# Patient Record
Sex: Male | Born: 1980
Health system: Southern US, Community
[De-identification: ages and names within clinical notes are randomized; demographics above are authoritative.]

## PROBLEM LIST (undated history)

## (undated) DIAGNOSIS — F419 Anxiety disorder, unspecified: Secondary | ICD-10-CM

## (undated) DIAGNOSIS — K219 Gastro-esophageal reflux disease without esophagitis: Secondary | ICD-10-CM

## (undated) DIAGNOSIS — E119 Type 2 diabetes mellitus without complications: Secondary | ICD-10-CM

## (undated) DIAGNOSIS — F329 Major depressive disorder, single episode, unspecified: Secondary | ICD-10-CM

## (undated) DIAGNOSIS — M1990 Unspecified osteoarthritis, unspecified site: Secondary | ICD-10-CM

## (undated) DIAGNOSIS — G43909 Migraine, unspecified, not intractable, without status migrainosus: Secondary | ICD-10-CM

## (undated) DIAGNOSIS — F1120 Opioid dependence, uncomplicated: Secondary | ICD-10-CM

## (undated) DIAGNOSIS — F32A Depression, unspecified: Secondary | ICD-10-CM

## (undated) DIAGNOSIS — I10 Essential (primary) hypertension: Secondary | ICD-10-CM

## (undated) DIAGNOSIS — J189 Pneumonia, unspecified organism: Secondary | ICD-10-CM

## (undated) HISTORY — PX: KNEE ARTHROSCOPY: SHX127

## (undated) HISTORY — PX: LAPAROSCOPIC GASTRIC SLEEVE RESECTION: SHX5895

---

## 2012-11-07 ENCOUNTER — Encounter (HOSPITAL_BASED_OUTPATIENT_CLINIC_OR_DEPARTMENT_OTHER): Payer: Self-pay | Admitting: *Deleted

## 2012-11-07 ENCOUNTER — Emergency Department (HOSPITAL_BASED_OUTPATIENT_CLINIC_OR_DEPARTMENT_OTHER)
Admission: EM | Admit: 2012-11-07 | Discharge: 2012-11-08 | Disposition: A | Discharge status: Home / Self Care | Payer: Self-pay | Attending: Emergency Medicine | Admitting: Emergency Medicine

## 2012-11-07 ENCOUNTER — Emergency Department (HOSPITAL_BASED_OUTPATIENT_CLINIC_OR_DEPARTMENT_OTHER): Payer: Self-pay

## 2012-11-07 DIAGNOSIS — M545 Low back pain, unspecified: Secondary | ICD-10-CM | POA: Insufficient documentation

## 2012-11-07 DIAGNOSIS — R52 Pain, unspecified: Secondary | ICD-10-CM

## 2012-11-07 DIAGNOSIS — F17200 Nicotine dependence, unspecified, uncomplicated: Secondary | ICD-10-CM | POA: Insufficient documentation

## 2012-11-07 DIAGNOSIS — I10 Essential (primary) hypertension: Secondary | ICD-10-CM

## 2012-11-07 DIAGNOSIS — E119 Type 2 diabetes mellitus without complications: Secondary | ICD-10-CM | POA: Insufficient documentation

## 2012-11-07 HISTORY — DX: Essential (primary) hypertension: I10

## 2012-11-07 LAB — URINALYSIS, ROUTINE W REFLEX MICROSCOPIC
Bilirubin Urine: NEGATIVE
Leukocytes, UA: NEGATIVE
Nitrite: NEGATIVE
Specific Gravity, Urine: 1.026 (ref 1.005–1.030)
Urobilinogen, UA: 1 mg/dL (ref 0.0–1.0)
pH: 6.5 (ref 5.0–8.0)

## 2012-11-07 MED ORDER — METHOCARBAMOL 500 MG PO TABS: 1000.0000 mg | ORAL_TABLET | Freq: Two times a day (BID) | ORAL | Status: DC

## 2012-11-07 MED ORDER — KETOROLAC TROMETHAMINE 60 MG/2ML IM SOLN
60.0000 mg | Freq: Once | INTRAMUSCULAR | Status: AC
  Administered 2012-11-07: 60 mg via INTRAMUSCULAR
  Filled 2012-11-07: qty 2

## 2012-11-07 MED ORDER — OXYCODONE-ACETAMINOPHEN 5-325 MG PO TABS
2.0000 | ORAL_TABLET | Freq: Once | ORAL | Status: AC
  Administered 2012-11-07: 2 via ORAL
  Filled 2012-11-07 (×2): qty 2

## 2012-11-07 MED ORDER — OXYCODONE-ACETAMINOPHEN 10-325 MG PO TABS: 1.0000 | ORAL_TABLET | ORAL | Status: DC | PRN

## 2012-11-07 MED ORDER — MELOXICAM 15 MG PO TABS: 15.0000 mg | ORAL_TABLET | Freq: Every day | ORAL | Status: DC

## 2012-11-07 NOTE — ED Provider Notes (Signed)
CSN: [id number]     Arrival date & time 11/07/12  1916 History     First MD Initiated Contact with Patient 11/07/12 2011     Chief Complaint  Patient presents with  . Back Pain   (Consider location/radiation/quality/duration/timing/severity/associated sxs/prior Treatment) HPI  Morbidly obese male presents tot the ED with c/o acute LBP. He states that he was hel[png to move furniture four days ago and developed slowly  And progressively since 5 days ago. Hx of the same however this is the worst. No radiation or weakness. Denies weakness, loss of bowel/bladder function or saddle anesthesia. Denies neck stiffness, headache, rash.  Denies fever or recent procedures to back. Denies urinary sxs. Denies fevers, chills, myalgias, arthralgias. Denies DOE, SOB, chest tightness or pressure, radiation to left arm, jaw or back, or diaphoresis. Denies headaches, light headedness, weakness, visual disturbances. Denies abdominal pain, nausea, vomiting, diarrhea or constipation.     Past Medical History  Diagnosis Date  . Hypertension   . Diabetes mellitus without complication    Past Surgical History  Procedure Laterality Date  . Knee surgery     No family history on file. History  Substance Use Topics  . Smoking status: Current Every Day Smoker  . Smokeless tobacco: Not on file  . Alcohol Use: No    Review of Systems Ten systems reviewed and are negative for acute change, except as noted in the HPI.   Allergies  Review of patient's allergies indicates no known allergies.  Home Medications  No current outpatient prescriptions on file. BP 160/101  Pulse 79  Temp(Src) 98.4 F (36.9 C) (Oral)  Resp 23  Ht 6\' 3"  (1.905 m)  Wt 470 lb 3 oz (213.276 kg)  BMI 58.77 kg/m2  SpO2 99% Physical Exam  Physical Exam  Nursing note and vitals reviewed. Constitutional: He appears well-developed and well-nourished. No distress. mobidly obese.r  HENT:  Head: Normocephalic and atraumatic.   Eyes: Conjunctivae normal are normal. No scleral icterus.  Neck: Normal range of motion. Neck supple.  Cardiovascular: Normal rate, regular rhythm and normal heart sounds.   Pulmonary/Chest: Effort normal and breath sounds normal. No respiratory distress.  Abdominal: Soft. There is no tenderness.  Musculoskeletal: He exhibits no edema. TTP lumbar paraspinals. ROM limited due to pain. Straight leg negative. Antalgic gait.   No CVA tenderness. Neurological: He is alert.  Skin: Skin is warm and dry. He is not diaphoretic.  Psychiatric: His behavior is normal.    ED Course   Procedures (including critical care time)  Labs Reviewed  URINALYSIS, ROUTINE W REFLEX MICROSCOPIC - Abnormal; Notable for the following:    Glucose, UA 100 (*)    All other components within normal limits   Dg Lumbar Spine Complete  11/07/2012   *RADIOLOGY REPORT*  Clinical Data: Low back pain.  LUMBAR SPINE - COMPLETE 4+ VIEW  Comparison: None.  Findings: Mild rightward scoliosis in the upper lumbar spine. Degenerative changes at L1-2.  No fracture or subluxation.  SI joints are symmetric and unremarkable.  IMPRESSION: No acute bony abnormality.   Original Report Authenticated By: Charlett Nose, M.D.   No diagnosis found.  MDM    Patient with back pain.  No neurological deficits and normal neuro exam.  Patient can walk but states is painful.  No loss of bowel or bladder control.  No concern for cauda equina.  No fever, night sweats, weight loss, h/o cancer, IVDU.  RICE protocol and pain medicine indicated and discussed with patient.  Arthor Captain, PA-C 11/13/12 1132

## 2012-11-07 NOTE — ED Notes (Signed)
C/o lower back pain that started this past Thursday. Denies any injury. States pain has gotten progressively worse. States pain is constant and sharp. Denies any radiation of pain. Denies any blood in his urine or any urinary frequency. Denies any hx of kidney stones. Denies any n/v. Sitting and walking make pain worse.

## 2012-11-15 NOTE — ED Provider Notes (Signed)
Medical screening examination/treatment/procedure(s) were performed by non-physician practitioner and as supervising physician I was immediately available for consultation/collaboration.   Christobal Morado B. Bernette Mayers, MD 11/15/12 [phone]

## 2012-11-23 ENCOUNTER — Ambulatory Visit: Payer: Self-pay | Attending: Internal Medicine | Admitting: Internal Medicine

## 2012-11-23 VITALS — BP 152/114 | HR 75 | Temp 98.4°F | Resp 18 | Ht 72.0 in | Wt >= 6400 oz

## 2012-11-23 DIAGNOSIS — M545 Low back pain, unspecified: Secondary | ICD-10-CM | POA: Insufficient documentation

## 2012-11-23 DIAGNOSIS — B49 Unspecified mycosis: Secondary | ICD-10-CM

## 2012-11-23 DIAGNOSIS — E119 Type 2 diabetes mellitus without complications: Secondary | ICD-10-CM | POA: Insufficient documentation

## 2012-11-23 DIAGNOSIS — E1165 Type 2 diabetes mellitus with hyperglycemia: Secondary | ICD-10-CM | POA: Insufficient documentation

## 2012-11-23 DIAGNOSIS — B354 Tinea corporis: Secondary | ICD-10-CM | POA: Insufficient documentation

## 2012-11-23 DIAGNOSIS — B369 Superficial mycosis, unspecified: Secondary | ICD-10-CM

## 2012-11-23 DIAGNOSIS — I10 Essential (primary) hypertension: Secondary | ICD-10-CM | POA: Insufficient documentation

## 2012-11-23 DIAGNOSIS — E876 Hypokalemia: Secondary | ICD-10-CM | POA: Insufficient documentation

## 2012-11-23 LAB — GLUCOSE, POCT (MANUAL RESULT ENTRY): POC Glucose: 100 mg/dl — AB (ref 70–99)

## 2012-11-23 LAB — POCT GLYCOSYLATED HEMOGLOBIN (HGB A1C): Hemoglobin A1C: 6.9

## 2012-11-23 MED ORDER — FLUCONAZOLE 150 MG PO TABS: 150.0000 mg | ORAL_TABLET | Freq: Once | ORAL | Status: DC

## 2012-11-23 MED ORDER — METHOCARBAMOL 500 MG PO TABS: 1000.0000 mg | ORAL_TABLET | Freq: Two times a day (BID) | ORAL | Status: DC

## 2012-11-23 MED ORDER — TERBINAFINE HCL 1 % EX CREA: TOPICAL_CREAM | Freq: Two times a day (BID) | CUTANEOUS | Status: DC

## 2012-11-23 MED ORDER — METFORMIN HCL 500 MG PO TABS: 500.0000 mg | ORAL_TABLET | Freq: Two times a day (BID) | ORAL | Status: DC

## 2012-11-23 MED ORDER — LISINOPRIL-HYDROCHLOROTHIAZIDE 20-25 MG PO TABS: 1.0000 | ORAL_TABLET | Freq: Every day | ORAL | Status: DC

## 2012-11-23 MED ORDER — TRAMADOL HCL 50 MG PO TABS: 50.0000 mg | ORAL_TABLET | Freq: Three times a day (TID) | ORAL | Status: DC | PRN

## 2012-11-23 NOTE — Progress Notes (Signed)
Pt here to establish care Hx HTN,DIABETES- NOT TAKING MEDICATION MORBID OBESITY C/O LOWER BACK PAIN,NONRADIATING POST INJURY 2 WEEKS AGO XRAYS NEG ER RAN OUT OF PAIN MEDS BUT STATES THEY WERE EFFECTIVE

## 2012-11-23 NOTE — Progress Notes (Signed)
Patient ID: Brian Day, male   DOB: 15-Apr-1980, 32 y.o.   MRN: [id number]  CC: To establish care  HPI: Patient is a 32 years old man morbidly obese hent today to establish medical care. He has history of hypertension and diabetes not on medications because he has not been to Dr. for a long time. He was recently seen in the urgent care for low back pain that started after a house shore, no specific history of trauma or fall. He said he has not been able to do what he used to do normally because of the pain, but is mostly on the sides of the low back area, no tingling or weakness of the legs, no numbness, no fecal or urinary incontinence. No chest pain, no shortness of breath. He smokes cigarettes about 1 pack per day.  No Known Allergies Past Medical History  Diagnosis Date  . Hypertension   . Diabetes mellitus without complication    Current Outpatient Prescriptions on File Prior to Visit  Medication Sig Dispense Refill  . meloxicam (MOBIC) 15 MG tablet Take 1 tablet (15 mg total) by mouth daily.  10 tablet  0  . oxyCODONE-acetaminophen (PERCOCET) 10-325 MG per tablet Take 1 tablet by mouth every 4 (four) hours as needed for pain.  30 tablet  0   No current facility-administered medications on file prior to visit.   Family History  Problem Relation Age of Onset  . Diabetes Mother   . Hypertension Mother   . Hypertension Father    History   Social History  . Marital Status: Single    Spouse Name: N/A    Number of Children: N/A  . Years of Education: N/A   Occupational History  . Not on file.   Social History Main Topics  . Smoking status: Current Every Day Smoker  . Smokeless tobacco: Not on file  . Alcohol Use: No  . Drug Use: Not on file  . Sexual Activity: Not on file   Other Topics Concern  . Not on file   Social History Narrative  . No narrative on file    Review of Systems: Constitutional: Negative for fever, chills, diaphoresis, activity change, appetite  change and fatigue. HENT: Negative for ear pain, nosebleeds, congestion, facial swelling, rhinorrhea, neck pain, neck stiffness and ear discharge.  Eyes: Negative for pain, discharge, redness, itching and visual disturbance. Respiratory: Negative for cough, choking, chest tightness, shortness of breath, wheezing and stridor.  Cardiovascular: Negative for chest pain, palpitations and leg swelling. Gastrointestinal: Negative for abdominal distention. Genitourinary: Negative for dysuria, urgency, frequency, hematuria, flank pain, decreased urine volume, difficulty urinating and dyspareunia.  Musculoskeletal: ++ back pain, no joint swelling, arthralgias and gait problem. Neurological: Negative for dizziness, tremors, seizures, syncope, facial asymmetry, speech difficulty, weakness, light-headedness, numbness and headaches.  Hematological: Negative for adenopathy. Does not bruise/bleed easily. Psychiatric/Behavioral: Negative for hallucinations, behavioral problems, confusion, dysphoric mood, decreased concentration and agitation.    Objective:   Filed Vitals:   11/23/12 1720  BP: 152/114  Pulse: 75  Temp: 98.4 F (36.9 C)  Resp: 18    Physical Exam: Constitutional: Patient appears well-developed and well-nourished. No distress. Morbidly obese HENT: Normocephalic, atraumatic, External right and left ear normal. Oropharynx is clear and moist.  Eyes: Conjunctivae and EOM are normal. PERRLA, no scleral icterus. Neck: Normal ROM. Neck supple. No JVD. No tracheal deviation. No thyromegaly. CVS: RRR, S1/S2 +, no murmurs, no gallops, no carotid bruit.  Pulmonary: Effort and breath sounds normal,  no stridor, rhonchi, wheezes, rales.  Abdominal: Soft. BS +, extremely obese with hanging abdominal wall, rubbing on the thighs with exfoliating fungal dermatitis  Musculoskeletal: Normal range of motion. No edema and no tenderness.  Lymphadenopathy: No lymphadenopathy noted, cervical, inguinal or  axillary Neuro: Alert. Normal reflexes, muscle tone coordination. No cranial nerve deficit. Skin: Skin is warm and dry. No rash noted. Not diaphoretic. No erythema. No pallor. Psychiatric: Normal mood and affect. Behavior, judgment, thought content normal.  No results found for this basename: WBC, HGB, HCT, MCV, PLT   No results found for this basename: CREATININE, BUN, NA, K, CL, CO2    Lab Results  Component Value Date   HGBA1C 6.9% 11/23/2012   Lipid Panel  No results found for this basename: chol, trig, hdl, cholhdl, vldl, ldlcalc       Assessment and plan:   Patient Active Problem List   Diagnosis Date Noted  . Accelerated hypertension 11/23/2012  . Diabetes 11/23/2012  . Morbid obesity 11/23/2012  . Low back pain 11/23/2012  . Fungal infection of skin of abdomen 11/23/2012   Start Lisinopril-hydrochlorothiazide 20-25 mg tablet by mouth daily for hypertension Start metformin 500 mg tablet by mouth twice a day for diabetes Patient has been counseled on nutrition control and exercise Patient extensively counseled on smoking cessation  Tramadol 50 mg tablet by mouth 3 times a day when necessary pain  Fluconazole 150 mg tablet by mouth once for fungal infection of the abdominal wall Terbinafine 1% cream local application to the abdominal wall  Labs today: CBC D. CMP Lipid profile TSH and free T4 Urinalysis Hemoglobin A1c is 6.9%  Brian Day was given clear instructions to go to ER or return to the clinic if symptoms don't improve, worsen or new problems develop.  Brian Day verbalized understanding.  Brian Day was told to call to get lab results if hasn't heard anything in the next week.        Jeanann Lewandowsky, MD [facility] [place], [place] [phone]   11/23/2012, 6:03 PM

## 2012-11-29 ENCOUNTER — Telehealth: Payer: Self-pay | Admitting: Emergency Medicine

## 2012-11-29 LAB — TSH+FREE T4: TSH: 2.39 u[IU]/mL (ref 0.450–4.500)

## 2012-11-29 NOTE — Telephone Encounter (Signed)
Pt given lab results and Diabetes/diet counseling

## 2012-12-15 LAB — SPECIMEN STATUS REPORT

## 2013-03-07 ENCOUNTER — Encounter (HOSPITAL_BASED_OUTPATIENT_CLINIC_OR_DEPARTMENT_OTHER): Payer: Self-pay | Admitting: Emergency Medicine

## 2013-03-07 ENCOUNTER — Emergency Department (HOSPITAL_BASED_OUTPATIENT_CLINIC_OR_DEPARTMENT_OTHER)
Admission: EM | Admit: 2013-03-07 | Discharge: 2013-03-07 | Disposition: A | Discharge status: Home / Self Care | Payer: Self-pay | Attending: Emergency Medicine | Admitting: Emergency Medicine

## 2013-03-07 ENCOUNTER — Emergency Department (HOSPITAL_BASED_OUTPATIENT_CLINIC_OR_DEPARTMENT_OTHER): Payer: Self-pay

## 2013-03-07 DIAGNOSIS — F17200 Nicotine dependence, unspecified, uncomplicated: Secondary | ICD-10-CM | POA: Insufficient documentation

## 2013-03-07 DIAGNOSIS — Z79899 Other long term (current) drug therapy: Secondary | ICD-10-CM | POA: Insufficient documentation

## 2013-03-07 DIAGNOSIS — G8911 Acute pain due to trauma: Secondary | ICD-10-CM | POA: Insufficient documentation

## 2013-03-07 DIAGNOSIS — M25579 Pain in unspecified ankle and joints of unspecified foot: Secondary | ICD-10-CM | POA: Insufficient documentation

## 2013-03-07 DIAGNOSIS — I10 Essential (primary) hypertension: Secondary | ICD-10-CM | POA: Insufficient documentation

## 2013-03-07 DIAGNOSIS — M25571 Pain in right ankle and joints of right foot: Secondary | ICD-10-CM

## 2013-03-07 DIAGNOSIS — E119 Type 2 diabetes mellitus without complications: Secondary | ICD-10-CM | POA: Insufficient documentation

## 2013-03-07 MED ORDER — MELOXICAM 7.5 MG PO TABS: 7.5000 mg | ORAL_TABLET | Freq: Every day | ORAL | Status: DC

## 2013-03-07 MED ORDER — TRAMADOL HCL 50 MG PO TABS: 50.0000 mg | ORAL_TABLET | Freq: Four times a day (QID) | ORAL | Status: DC | PRN

## 2013-03-07 NOTE — ED Notes (Signed)
C/o right ankle pain-injury "years ago"-pain increase in ankle pain x 4 days-pt using crutches

## 2013-03-07 NOTE — ED Provider Notes (Signed)
CSN: [id number]     Arrival date & time 03/07/13  1839 History   First MD Initiated Contact with Patient 03/07/13 2152     Chief Complaint  Patient presents with  . Ankle Injury   (Consider location/radiation/quality/duration/timing/severity/associated sxs/prior Treatment) Patient is a 32 y.o. male presenting with lower extremity injury. The history is provided by the patient.  Ankle Injury This is a recurrent problem. The problem occurs constantly. The problem has been gradually worsening. He has tried nothing for the symptoms.  Brian Day is a 32 y.o. morbidly obese male who presents to the ED with right ankle pain. He states that he injured the right ankle 2 years ago. Has done well since then until 4 days ago when the ankle started hurting and he saw bruising. The pain is in the ankle and radiates to the foot. The pain increases with ambulation and pressure to the area. He denies any new injury to the area.   Past Medical History  Diagnosis Date  . Hypertension   . Diabetes mellitus without complication    Past Surgical History  Procedure Laterality Date  . Knee surgery     Family History  Problem Relation Age of Onset  . Diabetes Mother   . Hypertension Mother   . Hypertension Father    History  Substance Use Topics  . Smoking status: Current Every Day Smoker  . Smokeless tobacco: Not on file  . Alcohol Use: No    Review of Systems Negative except as stated in HPI Allergies  Review of patient's allergies indicates no known allergies.  Home Medications   Current Outpatient Rx  Name  Route  Sig  Dispense  Refill  . fluconazole (DIFLUCAN) 150 MG tablet   Oral   Take 1 tablet (150 mg total) by mouth once.   1 tablet   0   . lisinopril-hydrochlorothiazide (PRINZIDE,ZESTORETIC) 20-25 MG per tablet   Oral   Take 1 tablet by mouth daily.   90 tablet   3   . meloxicam (MOBIC) 15 MG tablet   Oral   Take 1 tablet (15 mg total) by mouth daily.   10 tablet  0   . metFORMIN (GLUCOPHAGE) 500 MG tablet   Oral   Take 1 tablet (500 mg total) by mouth 2 (two) times daily with a meal.   180 tablet   3   . methocarbamol (ROBAXIN) 500 MG tablet   Oral   Take 2 tablets (1,000 mg total) by mouth 2 (two) times daily.   40 tablet   0   . oxyCODONE-acetaminophen (PERCOCET) 10-325 MG per tablet   Oral   Take 1 tablet by mouth every 4 (four) hours as needed for pain.   30 tablet   0   . terbinafine (LAMISIL AT) 1 % cream   Topical   Apply topically 2 (two) times daily.   30 g   0   . traMADol (ULTRAM) 50 MG tablet   Oral   Take 1 tablet (50 mg total) by mouth every 8 (eight) hours as needed for pain.   30 tablet   0    BP 137/113  Pulse 98  Temp(Src) 98.1 F (36.7 C) (Oral)  Resp 22  Ht 6\' 3"  (1.905 m)  Wt 495 lb (224.531 kg)  BMI 61.87 kg/m2  SpO2 98% Physical Exam  Nursing note and vitals reviewed. Constitutional: He is oriented to person, place, and time. No distress.  Morbidly obese  HENT:  Head:  Normocephalic and atraumatic.  Eyes: EOM are normal.  Neck: Neck supple.  Cardiovascular: Normal rate.   Pulmonary/Chest: Effort normal.  Musculoskeletal: Normal range of motion.       Right ankle: He exhibits ecchymosis. He exhibits normal range of motion, no deformity, no laceration and normal pulse. Swelling: minimal. Tenderness. Lateral malleolus tenderness found. Achilles tendon normal.       Feet:  Strong pedal pulse, adequate circulation, good touch sensation.   Neurological: He is alert and oriented to person, place, and time. No cranial nerve deficit.  Skin: Skin is warm and dry.  Psychiatric: He has a normal mood and affect. His behavior is normal.    ED Course: Watson-Jones dressing, patient to use his crutches, ice elevation and pain management.   Procedures (including critical care time) Labs Review Labs Reviewed - No data to display Imaging Review Dg Ankle Complete Right  03/07/2013   CLINICAL DATA:  Right  ankle pain for 4 days, no recent injury, swelling  EXAM: RIGHT ANKLE - COMPLETE 3+ VIEW  COMPARISON:  01/27/2012  FINDINGS: Osseous mineralization normal.  Ankle mortise intact.  No acute fracture, dislocation or bone destruction.  Small plantar and Achilles insertion calcaneal spurs.  Spurring versus non fused ossicle at the dorsal margin of the navicular.  IMPRESSION: No acute osseous abnormalities.   Electronically Signed   By: Ulyses Southward M.D.   On: 03/07/2013 19:45     MDM  32 y.o. male with right ankle pain. Stable for discharge to follow up with ortho. He will call tomorrow for appointment. I have reviewed this patient's vital signs, nurses notes, appropriate imaging and discussed findings with the patient. He voices understanding.      Medication List    STOP taking these medications       oxyCODONE-acetaminophen 10-325 MG per tablet  Commonly known as:  PERCOCET      TAKE these medications       traMADol 50 MG tablet  Commonly known as:  ULTRAM  Take 1 tablet (50 mg total) by mouth every 6 (six) hours as needed.      ASK your doctor about these medications       fluconazole 150 MG tablet  Commonly known as:  DIFLUCAN  Take 1 tablet (150 mg total) by mouth once.     lisinopril-hydrochlorothiazide 20-25 MG per tablet  Commonly known as:  PRINZIDE,ZESTORETIC  Take 1 tablet by mouth daily.     meloxicam 15 MG tablet  Commonly known as:  MOBIC  Take 1 tablet (15 mg total) by mouth daily.  Ask about: Which instructions should I use?     meloxicam 7.5 MG tablet  Commonly known as:  MOBIC  Take 1 tablet (7.5 mg total) by mouth daily.  Ask about: Which instructions should I use?     metFORMIN 500 MG tablet  Commonly known as:  GLUCOPHAGE  Take 1 tablet (500 mg total) by mouth 2 (two) times daily with a meal.     methocarbamol 500 MG tablet  Commonly known as:  ROBAXIN  Take 2 tablets (1,000 mg total) by mouth 2 (two) times daily.     terbinafine 1 % cream  Commonly  known as:  LAMISIL AT  Apply topically 2 (two) times daily.         [facility] Orlene Och, [place] 03/08/13 [phone]

## 2013-03-09 NOTE — ED Provider Notes (Signed)
Medical screening examination/treatment/procedure(s) were performed by non-physician practitioner and as supervising physician I was immediately available for consultation/collaboration.  EKG Interpretation   None        Gianelle Mccaul, MD 03/09/13 0827 

## 2013-10-27 DIAGNOSIS — I10 Essential (primary) hypertension: Secondary | ICD-10-CM | POA: Diagnosis present

## 2013-12-29 DIAGNOSIS — E1142 Type 2 diabetes mellitus with diabetic polyneuropathy: Secondary | ICD-10-CM | POA: Diagnosis present

## 2014-02-15 ENCOUNTER — Emergency Department (HOSPITAL_BASED_OUTPATIENT_CLINIC_OR_DEPARTMENT_OTHER)
Admission: EM | Admit: 2014-02-15 | Discharge: 2014-02-15 | Disposition: A | Discharge status: Home / Self Care | Payer: Medicaid Other | Attending: Emergency Medicine | Admitting: Emergency Medicine

## 2014-02-15 ENCOUNTER — Encounter (HOSPITAL_BASED_OUTPATIENT_CLINIC_OR_DEPARTMENT_OTHER): Payer: Self-pay

## 2014-02-15 ENCOUNTER — Emergency Department (HOSPITAL_BASED_OUTPATIENT_CLINIC_OR_DEPARTMENT_OTHER): Payer: Medicaid Other

## 2014-02-15 DIAGNOSIS — E119 Type 2 diabetes mellitus without complications: Secondary | ICD-10-CM | POA: Diagnosis not present

## 2014-02-15 DIAGNOSIS — Z720 Tobacco use: Secondary | ICD-10-CM | POA: Insufficient documentation

## 2014-02-15 DIAGNOSIS — R519 Headache, unspecified: Secondary | ICD-10-CM

## 2014-02-15 DIAGNOSIS — I10 Essential (primary) hypertension: Secondary | ICD-10-CM | POA: Insufficient documentation

## 2014-02-15 DIAGNOSIS — Z79899 Other long term (current) drug therapy: Secondary | ICD-10-CM | POA: Insufficient documentation

## 2014-02-15 DIAGNOSIS — R51 Headache: Secondary | ICD-10-CM | POA: Diagnosis present

## 2014-02-15 HISTORY — DX: Morbid (severe) obesity due to excess calories: E66.01

## 2014-02-15 LAB — CBG MONITORING, ED: GLUCOSE-CAPILLARY: 128 mg/dL — AB (ref 70–99)

## 2014-02-15 MED ORDER — HYDROMORPHONE HCL 1 MG/ML IJ SOLN
1.0000 mg | Freq: Once | INTRAMUSCULAR | Status: AC
  Administered 2014-02-15: 1 mg via INTRAVENOUS
  Filled 2014-02-15: qty 1

## 2014-02-15 MED ORDER — METOCLOPRAMIDE HCL 5 MG/ML IJ SOLN
10.0000 mg | Freq: Once | INTRAMUSCULAR | Status: AC
  Administered 2014-02-15: 10 mg via INTRAVENOUS
  Filled 2014-02-15: qty 2

## 2014-02-15 NOTE — ED Notes (Signed)
MD at bedside. 

## 2014-02-15 NOTE — ED Provider Notes (Addendum)
Complains of gradual in onset diffuse headache while at rest . Pain was progressively worsening over 2 hours. Patient had come home after unloading wood earlier today. Patient reports he does not normally get headaches Patient is sleepy arousable to verbal stimulus Glasgow Coma Score 15 appears uncomfortable. Cranial nerves II through XII grossly intact. Moves all extremities. Motor strength 5 over 5 overall. Patient was ambulatory on discharge Strongly doubt occult subarachnoid hemorrhage with gradual onset headache onset during rest  Doug SouSam Koua Deeg, MD 02/15/14 2315  Doug SouSam Amylee Lodato, MD 02/15/14 2316

## 2014-02-15 NOTE — ED Notes (Signed)
C/o HA since 230pm today-no OTC meds-hx of HTN 170/106-mother states PCP is "workin gon getting it regulated"

## 2014-02-15 NOTE — ED Provider Notes (Signed)
CSN: [id number]     Arrival date & time 02/15/14  1755 History   First MD Initiated Contact with Patient 02/15/14 1812     Chief Complaint  Patient presents with  . Headache     (Consider location/radiation/quality/duration/timing/severity/associated sxs/prior Treatment) Patient is a 33 y.o. male presenting with headaches. The history is provided by the patient. No language interpreter was used.  Headache Pain location:  Frontal Radiates to:  Does not radiate Severity currently:  7/10 Severity at highest:  7/10 Onset quality:  Gradual Duration:  4 hours Timing:  Constant Progression:  Worsening Chronicity:  New Similar to prior headaches: no   Context: straining   Context: not intercourse   Relieved by:  Nothing Worsened by:  Nothing tried Ineffective treatments:  None tried Associated symptoms: no fever, no sinus pressure, no sore throat and no weakness   Risk factors: no anger and does not have insomnia   Pt complains of a headache that started today.  Pt has a history of high blood pressure.  Pt was seen by primary MD today  Past Medical History  Diagnosis Date  . Hypertension   . Diabetes mellitus without complication   . Morbid obesity    Past Surgical History  Procedure Laterality Date  . Knee surgery     Family History  Problem Relation Age of Onset  . Diabetes Mother   . Hypertension Mother   . Hypertension Father    History  Substance Use Topics  . Smoking status: Current Every Day Smoker  . Smokeless tobacco: Not on file  . Alcohol Use: No    Review of Systems  Constitutional: Negative for fever.  HENT: Negative for sinus pressure and sore throat.   Neurological: Positive for headaches.  All other systems reviewed and are negative.     Allergies  Lisinopril  Home Medications   Prior to Admission medications   Medication Sig Start Date End Date Taking? Authorizing Provider  ALPRAZolam (XANAX PO) Take by mouth.   Yes Historical Provider,  MD  DULoxetine HCl (CYMBALTA PO) Take by mouth.   Yes Historical Provider, MD  GABAPENTIN, ONCE-DAILY, PO Take by mouth.   Yes Historical Provider, MD  hydrochlorothiazide (HYDRODIURIL) 25 MG tablet Take 25 mg by mouth daily.   Yes Historical Provider, MD  Omeprazole (PRILOSEC PO) Take by mouth.   Yes Historical Provider, MD  Oxycodone-Acetaminophen (PERCOCET PO) Take by mouth.   Yes Historical Provider, MD  TRAZODONE HCL PO Take by mouth.   Yes Historical Provider, MD  metFORMIN (GLUCOPHAGE) 500 MG tablet Take 1 tablet (500 mg total) by mouth 2 (two) times daily with a meal. 11/23/12   Quentin Angstlugbemiga E Jegede, MD  methocarbamol (ROBAXIN) 500 MG tablet Take 2 tablets (1,000 mg total) by mouth 2 (two) times daily. 11/23/12   Quentin Angstlugbemiga E Jegede, MD  terbinafine (LAMISIL AT) 1 % cream Apply topically 2 (two) times daily. 11/23/12   Quentin Angstlugbemiga E Jegede, MD  traMADol (ULTRAM) 50 MG tablet Take 1 tablet (50 mg total) by mouth every 6 (six) hours as needed. 03/07/13   Hope Orlene OchM Neese, NP   BP 163/110 mmHg  Pulse 100  Temp(Src) 97.7 F (36.5 C) (Oral)  Resp 20  Ht 6\' 2"  (1.88 m)  Wt 466 lb (211.376 kg)  BMI 59.81 kg/m2  SpO2 97% Physical Exam  Constitutional: He is oriented to person, place, and time. He appears well-developed and well-nourished.  HENT:  Head: Normocephalic and atraumatic.  Right Ear: External ear  normal.  Left Ear: External ear normal.  Nose: Nose normal.  Mouth/Throat: Oropharynx is clear and moist.  Eyes: Conjunctivae and EOM are normal. Pupils are equal, round, and reactive to light.  Neck: Normal range of motion.  Cardiovascular: Normal rate and normal heart sounds.   Pulmonary/Chest: Effort normal and breath sounds normal.  Abdominal: Soft. He exhibits no distension.  Musculoskeletal: Normal range of motion.  Neurological: He is alert and oriented to person, place, and time.  Skin: Skin is warm.  Psychiatric: He has a normal mood and affect.  Nursing note and vitals  reviewed.   ED Course  Procedures (including critical care time) Labs Review Labs Reviewed - No data to display  Imaging Review No results found.   EKG Interpretation None      MDM  Dr. Rennis ChrisJacobowitz in to see and examine patient. Patient given Reglan IV CT scan shows some chronic sinus disease no acute intracranial findings. he was given Dilaudid IV and reports some relief of headache. Patient is advised to continue blood pressure management.    Final diagnoses:  Head ache   AVS Continue home pain medicines    Elson AreasLeslie K Sofia, PA-C 02/15/14 2035  Doug SouSam Jacubowitz, MD 02/15/14 2317

## 2014-02-15 NOTE — Discharge Instructions (Signed)

## 2014-05-23 DIAGNOSIS — J189 Pneumonia, unspecified organism: Secondary | ICD-10-CM

## 2014-05-23 HISTORY — DX: Pneumonia, unspecified organism: J18.9

## 2014-06-06 ENCOUNTER — Emergency Department (HOSPITAL_BASED_OUTPATIENT_CLINIC_OR_DEPARTMENT_OTHER): Payer: Medicaid Other

## 2014-06-06 ENCOUNTER — Encounter (HOSPITAL_BASED_OUTPATIENT_CLINIC_OR_DEPARTMENT_OTHER): Payer: Self-pay | Admitting: *Deleted

## 2014-06-06 ENCOUNTER — Emergency Department (HOSPITAL_BASED_OUTPATIENT_CLINIC_OR_DEPARTMENT_OTHER)
Admission: EM | Admit: 2014-06-06 | Discharge: 2014-06-06 | Disposition: A | Discharge status: Home / Self Care | Payer: Medicaid Other | Source: Home / Self Care | Attending: Emergency Medicine | Admitting: Emergency Medicine

## 2014-06-06 DIAGNOSIS — J189 Pneumonia, unspecified organism: Secondary | ICD-10-CM

## 2014-06-06 MED ORDER — GUAIFENESIN-CODEINE 100-10 MG/5ML PO SOLN: 5.0000 mL | ORAL | Status: DC | PRN

## 2014-06-06 MED ORDER — ONDANSETRON 4 MG PO TBDP
4.0000 mg | ORAL_TABLET | Freq: Once | ORAL | Status: AC
  Administered 2014-06-06: 4 mg via ORAL
  Filled 2014-06-06: qty 1

## 2014-06-06 MED ORDER — ONDANSETRON 4 MG PO TBDP: 4.0000 mg | ORAL_TABLET | Freq: Three times a day (TID) | ORAL | Status: DC | PRN

## 2014-06-06 MED ORDER — LEVOFLOXACIN 500 MG PO TABS
500.0000 mg | ORAL_TABLET | Freq: Once | ORAL | Status: AC
  Administered 2014-06-06: 500 mg via ORAL
  Filled 2014-06-06: qty 1

## 2014-06-06 MED ORDER — IBUPROFEN 800 MG PO TABS: 800.0000 mg | ORAL_TABLET | Freq: Three times a day (TID) | ORAL | Status: DC | PRN

## 2014-06-06 MED ORDER — IBUPROFEN 800 MG PO TABS
800.0000 mg | ORAL_TABLET | Freq: Once | ORAL | Status: AC
  Administered 2014-06-06: 800 mg via ORAL
  Filled 2014-06-06: qty 1

## 2014-06-06 MED ORDER — LEVOFLOXACIN 500 MG PO TABS: 500.0000 mg | ORAL_TABLET | Freq: Every day | ORAL | Status: DC

## 2014-06-06 NOTE — Discharge Instructions (Signed)

## 2014-06-06 NOTE — ED Notes (Signed)
Patient transported to x-ray. ?

## 2014-06-06 NOTE — ED Notes (Signed)
Cold symptoms for a week. He was started on an antibiotic by his MD. No better. Cough with greenish sputum. Fever and no energy.

## 2014-06-06 NOTE — ED Provider Notes (Signed)
TIME SEEN: 11:30 AM  CHIEF COMPLAINT: Body aches, cough, fever, nausea, vomiting, diarrhea  HPI: Pt is a 34 y.o. male with history of hypertension, non-insulin-dependent diabetes who presents to the emergency department with complaints of one week of cough with yellow, green sputum production, fevers that have improved over the past several days, nausea, vomiting, diarrhea, body aches. Denies any sick contacts or recent travel. Denies having a history of an influenza vaccination last year. States he was put on an antibiotic by his primary care physician that started with an "O" but he was unable to take this medication because of diarrhea. States he is having chest pain with coughing. No shortness of breath.  ROS: See HPI Constitutional:  fever  Eyes: no drainage  ENT:  runny nose   Cardiovascular:  Chest pain with coughing Resp: no SOB  GI:  vomiting GU: no dysuria Integumentary: no rash  Allergy: no hives  Musculoskeletal: no leg swelling  Neurological: no slurred speech ROS otherwise negative  PAST MEDICAL HISTORY/PAST SURGICAL HISTORY:  Past Medical History  Diagnosis Date  . Hypertension   . Diabetes mellitus without complication   . Morbid obesity     MEDICATIONS:  Prior to Admission medications   Medication Sig Start Date End Date Taking? Authorizing Provider  ALPRAZolam (XANAX PO) Take by mouth.    Historical Provider, MD  DULoxetine HCl (CYMBALTA PO) Take by mouth.    Historical Provider, MD  GABAPENTIN, ONCE-DAILY, PO Take by mouth.    Historical Provider, MD  hydrochlorothiazide (HYDRODIURIL) 25 MG tablet Take 25 mg by mouth daily.    Historical Provider, MD  metFORMIN (GLUCOPHAGE) 500 MG tablet Take 1 tablet (500 mg total) by mouth 2 (two) times daily with a meal. 11/23/12   Quentin Angstlugbemiga E Jegede, MD  methocarbamol (ROBAXIN) 500 MG tablet Take 2 tablets (1,000 mg total) by mouth 2 (two) times daily. 11/23/12   Quentin Angstlugbemiga E Jegede, MD  Omeprazole (PRILOSEC PO) Take by mouth.     Historical Provider, MD  Oxycodone-Acetaminophen (PERCOCET PO) Take by mouth.    Historical Provider, MD  terbinafine (LAMISIL AT) 1 % cream Apply topically 2 (two) times daily. 11/23/12   Quentin Angstlugbemiga E Jegede, MD  traMADol (ULTRAM) 50 MG tablet Take 1 tablet (50 mg total) by mouth every 6 (six) hours as needed. 03/07/13   Hope Orlene OchM Neese, NP  TRAZODONE HCL PO Take by mouth.    Historical Provider, MD    ALLERGIES:  Allergies  Allergen Reactions  . Lisinopril     SOCIAL HISTORY:  History  Substance Use Topics  . Smoking status: Current Every Day Smoker  . Smokeless tobacco: Not on file  . Alcohol Use: No    FAMILY HISTORY: Family History  Problem Relation Age of Onset  . Diabetes Mother   . Hypertension Mother   . Hypertension Father     EXAM: BP 192/110 mmHg  Pulse 97  Temp(Src) 97.6 F (36.4 C) (Oral)  Resp 22  Ht 6\' 2"  (1.88 m)  Wt 466 lb (211.376 kg)  BMI 59.81 kg/m2  SpO2 95% CONSTITUTIONAL: Alert and oriented and responds appropriately to questions. Well-appearing; well-nourished, appears uncomfortable but is nontoxic, morbidly obese HEAD: Normocephalic EYES: Conjunctivae clear, PERRL ENT: normal nose; dried yellow nasal congestion and bilateral nostrils; moist mucous membranes; pharynx without lesions noted, no tonsillar hypertrophy or exudate, no uvular deviation, no trismus or drooling, normal phonation NECK: Supple, no meningismus, no LAD  CARD: RRR; S1 and S2 appreciated; no murmurs, no  clicks, no rubs, no gallops RESP: Normal chest excursion without splinting or tachypnea; breath sounds clear and equal bilaterally; no wheezes, no rhonchi, no rales, no hypoxia or respiratory distress ABD/GI: Normal bowel sounds; non-distended; soft, non-tender, no rebound, no guarding BACK:  The back appears normal and is non-tender to palpation, there is no CVA tenderness EXT: Normal ROM in all joints; non-tender to palpation; no edema; normal capillary refill; no cyanosis     SKIN: Normal color for age and race; warm, no rash NEURO: Moves all extremities equally PSYCH: The patient's mood and manner are appropriate. Grooming and personal hygiene are appropriate.  MEDICAL DECISION MAKING: Patient here with community-acquired pneumonia seen on chest x-ray. We'll discharge on Levaquin. We'll give him prescription for ibuprofen, Zofran, guaifenesin with codeine. Discussed return precautions. He verbalizes understanding and is comfortable with plan. Otherwise hemodynamically stable, nontoxic, no hypoxia.      Layla Maw Ward, DO 06/06/14 1150

## 2014-06-07 ENCOUNTER — Encounter (HOSPITAL_BASED_OUTPATIENT_CLINIC_OR_DEPARTMENT_OTHER): Payer: Self-pay

## 2014-06-07 ENCOUNTER — Emergency Department (HOSPITAL_BASED_OUTPATIENT_CLINIC_OR_DEPARTMENT_OTHER): Payer: Medicaid Other

## 2014-06-07 ENCOUNTER — Inpatient Hospital Stay (HOSPITAL_BASED_OUTPATIENT_CLINIC_OR_DEPARTMENT_OTHER)
Admission: EM | Admit: 2014-06-07 | Discharge: 2014-06-09 | DRG: 193 | Disposition: A | Discharge status: Home / Self Care | Payer: Medicaid Other | Attending: Internal Medicine | Admitting: Internal Medicine

## 2014-06-07 DIAGNOSIS — A047 Enterocolitis due to Clostridium difficile: Secondary | ICD-10-CM | POA: Diagnosis present

## 2014-06-07 DIAGNOSIS — E876 Hypokalemia: Secondary | ICD-10-CM | POA: Diagnosis present

## 2014-06-07 DIAGNOSIS — E1165 Type 2 diabetes mellitus with hyperglycemia: Secondary | ICD-10-CM

## 2014-06-07 DIAGNOSIS — E119 Type 2 diabetes mellitus without complications: Secondary | ICD-10-CM

## 2014-06-07 DIAGNOSIS — F419 Anxiety disorder, unspecified: Secondary | ICD-10-CM | POA: Diagnosis present

## 2014-06-07 DIAGNOSIS — A0472 Enterocolitis due to Clostridium difficile, not specified as recurrent: Secondary | ICD-10-CM

## 2014-06-07 DIAGNOSIS — Z888 Allergy status to other drugs, medicaments and biological substances status: Secondary | ICD-10-CM | POA: Diagnosis not present

## 2014-06-07 DIAGNOSIS — J9691 Respiratory failure, unspecified with hypoxia: Secondary | ICD-10-CM | POA: Diagnosis present

## 2014-06-07 DIAGNOSIS — E118 Type 2 diabetes mellitus with unspecified complications: Secondary | ICD-10-CM

## 2014-06-07 DIAGNOSIS — R0602 Shortness of breath: Secondary | ICD-10-CM

## 2014-06-07 DIAGNOSIS — F17210 Nicotine dependence, cigarettes, uncomplicated: Secondary | ICD-10-CM | POA: Diagnosis present

## 2014-06-07 DIAGNOSIS — F329 Major depressive disorder, single episode, unspecified: Secondary | ICD-10-CM | POA: Diagnosis present

## 2014-06-07 DIAGNOSIS — Z794 Long term (current) use of insulin: Secondary | ICD-10-CM

## 2014-06-07 DIAGNOSIS — Z6841 Body Mass Index (BMI) 40.0 and over, adult: Secondary | ICD-10-CM

## 2014-06-07 DIAGNOSIS — J189 Pneumonia, unspecified organism: Secondary | ICD-10-CM | POA: Diagnosis not present

## 2014-06-07 DIAGNOSIS — I10 Essential (primary) hypertension: Secondary | ICD-10-CM | POA: Diagnosis present

## 2014-06-07 DIAGNOSIS — J1100 Influenza due to unidentified influenza virus with unspecified type of pneumonia: Secondary | ICD-10-CM | POA: Diagnosis present

## 2014-06-07 DIAGNOSIS — IMO0002 Reserved for concepts with insufficient information to code with codable children: Secondary | ICD-10-CM

## 2014-06-07 HISTORY — DX: Depression, unspecified: F32.A

## 2014-06-07 HISTORY — DX: Major depressive disorder, single episode, unspecified: F32.9

## 2014-06-07 HISTORY — DX: Anxiety disorder, unspecified: F41.9

## 2014-06-07 LAB — BASIC METABOLIC PANEL
Anion gap: 9 (ref 5–15)
BUN: 8 mg/dL (ref 6–23)
CALCIUM: 8.2 mg/dL — AB (ref 8.4–10.5)
CHLORIDE: 98 mmol/L (ref 96–112)
CO2: 31 mmol/L (ref 19–32)
CREATININE: 0.77 mg/dL (ref 0.50–1.35)
GFR calc non Af Amer: >90 mL/min (ref >90)
Glucose, Bld: 196 mg/dL — ABNORMAL HIGH (ref 70–99)
Potassium: 2.9 mmol/L — ABNORMAL LOW (ref 3.5–5.1)
SODIUM: 138 mmol/L (ref 135–145)

## 2014-06-07 LAB — CBC
HEMATOCRIT: 39.4 % (ref 39.0–52.0)
HEMOGLOBIN: 13.9 g/dL (ref 13.0–17.0)
MCH: 32.9 pg (ref 26.0–34.0)
MCHC: 35.3 g/dL (ref 30.0–36.0)
MCV: 93.1 fL (ref 78.0–100.0)
Platelets: 201 10*3/uL (ref 150–400)
RBC: 4.23 MIL/uL (ref 4.22–5.81)
RDW: 12.3 % (ref 11.5–15.5)
WBC: 5.2 10*3/uL (ref 4.0–10.5)

## 2014-06-07 LAB — I-STAT CG4 LACTIC ACID, ED: Lactic Acid, Venous: 1.71 mmol/L (ref 0.5–2.0)

## 2014-06-07 LAB — GLUCOSE, CAPILLARY: Glucose-Capillary: 217 mg/dL — ABNORMAL HIGH (ref 70–99)

## 2014-06-07 MED ORDER — AZITHROMYCIN 500 MG IV SOLR
500.0000 mg | Freq: Once | INTRAVENOUS | Status: AC
  Administered 2014-06-07: 500 mg via INTRAVENOUS
  Filled 2014-06-07: qty 500

## 2014-06-07 MED ORDER — SODIUM CHLORIDE 0.9 % IV BOLUS (SEPSIS)
1000.0000 mL | Freq: Once | INTRAVENOUS | Status: AC
  Administered 2014-06-07: 1000 mL via INTRAVENOUS

## 2014-06-07 MED ORDER — AZITHROMYCIN 500 MG PO TABS
500.0000 mg | ORAL_TABLET | ORAL | Status: DC
  Filled 2014-06-07: qty 1

## 2014-06-07 MED ORDER — CEFTRIAXONE SODIUM IN DEXTROSE 20 MG/ML IV SOLN
1.0000 g | INTRAVENOUS | Status: DC
  Administered 2014-06-07: 1 g via INTRAVENOUS
  Filled 2014-06-07: qty 50

## 2014-06-07 MED ORDER — DEXTROSE 5 % IV SOLN: 1.0000 g | Freq: Once | INTRAVENOUS | Status: DC

## 2014-06-07 MED ORDER — HYDROCHLOROTHIAZIDE 25 MG PO TABS
25.0000 mg | ORAL_TABLET | Freq: Every day | ORAL | Status: DC
  Administered 2014-06-08 – 2014-06-09 (×2): 25 mg via ORAL
  Filled 2014-06-07 (×2): qty 1

## 2014-06-07 MED ORDER — METFORMIN HCL 500 MG PO TABS
500.0000 mg | ORAL_TABLET | Freq: Two times a day (BID) | ORAL | Status: DC
  Filled 2014-06-07: qty 1

## 2014-06-07 MED ORDER — HEPARIN SODIUM (PORCINE) 5000 UNIT/ML IJ SOLN
5000.0000 [IU] | Freq: Three times a day (TID) | INTRAMUSCULAR | Status: DC
  Administered 2014-06-08 (×2): 5000 [IU] via SUBCUTANEOUS
  Filled 2014-06-07 (×7): qty 1

## 2014-06-07 MED ORDER — GUAIFENESIN-CODEINE 100-10 MG/5ML PO SOLN
5.0000 mL | ORAL | Status: DC | PRN
  Administered 2014-06-09: 5 mL via ORAL
  Filled 2014-06-07: qty 5

## 2014-06-07 MED ORDER — TRAMADOL HCL 50 MG PO TABS: 50.0000 mg | ORAL_TABLET | Freq: Four times a day (QID) | ORAL | Status: DC | PRN

## 2014-06-07 MED ORDER — LEVOFLOXACIN IN D5W 750 MG/150ML IV SOLN
750.0000 mg | Freq: Once | INTRAVENOUS | Status: AC
  Administered 2014-06-07: 750 mg via INTRAVENOUS
  Filled 2014-06-07: qty 150

## 2014-06-07 MED ORDER — CETYLPYRIDINIUM CHLORIDE 0.05 % MT LIQD
7.0000 mL | Freq: Two times a day (BID) | OROMUCOSAL | Status: DC
  Administered 2014-06-07 – 2014-06-08 (×2): 7 mL via OROMUCOSAL

## 2014-06-07 NOTE — ED Notes (Signed)
Pt reports one week history of cough, fever, body aches, diagnosed with PNA yesterday - states prescription medicines are not effective.

## 2014-06-07 NOTE — ED Provider Notes (Signed)
CSN: [id number]     Arrival date & time 06/07/14  1837 History  This chart was scribed for Elwin Mocha, MD by Chestine Spore, ED Scribe. The patient was seen in room MH05/MH05 at 7:06 PM.     Chief Complaint  Patient presents with  . Shortness of Breath      Patient is a 34 y.o. male presenting with shortness of breath. The history is provided by the patient. No language interpreter was used.  Shortness of Breath Severity:  Moderate Onset quality:  Sudden Duration:  1 week Timing:  Constant Progression:  Worsening Chronicity:  New Relieved by:  Nothing Worsened by:  Deep breathing Ineffective treatments: Levaquin. Associated symptoms: cough and fever    HPI Comments: Brian Day is a 34 y.o. male with a medical hx of HTN, DM, and morbid obesity who presents to the Emergency Department complaining of SOB onset 1 week. Pt was seen here at [facility] yesterday and was dx with community pneumonia. Pt has not ate any food for the past 6 days. Pt does not feel like he has gotten better since being seen and treatment begun yesterday. He states that he is having associated symptoms of fever, appetite change, fatigue, cough. He denies any other symptoms. Pt has a PCP at [facility].    Past Medical History  Diagnosis Date  . Hypertension   . Diabetes mellitus without complication   . Morbid obesity    Past Surgical History  Procedure Laterality Date  . Knee surgery     Family History  Problem Relation Age of Onset  . Diabetes Mother   . Hypertension Mother   . Hypertension Father    History  Substance Use Topics  . Smoking status: Current Every Day Smoker  . Smokeless tobacco: Not on file  . Alcohol Use: No    Review of Systems  Constitutional: Positive for fever and appetite change.  Respiratory: Positive for cough and shortness of breath.   All other systems reviewed and are negative.     Allergies  Lisinopril  Home Medications   Prior to Admission  medications   Medication Sig Start Date End Date Taking? Authorizing Provider  ALPRAZolam (XANAX PO) Take by mouth.    Historical Provider, MD  DULoxetine HCl (CYMBALTA PO) Take by mouth.    Historical Provider, MD  GABAPENTIN, ONCE-DAILY, PO Take by mouth.    Historical Provider, MD  guaiFENesin-codeine 100-10 MG/5ML syrup Take 5 mLs by mouth every 4 (four) hours as needed for cough. 06/06/14   Kristen N Ward, DO  hydrochlorothiazide (HYDRODIURIL) 25 MG tablet Take 25 mg by mouth daily.    Historical Provider, MD  ibuprofen (ADVIL,MOTRIN) 800 MG tablet Take 1 tablet (800 mg total) by mouth every 8 (eight) hours as needed for mild pain. 06/06/14   Kristen N Ward, DO  levofloxacin (LEVAQUIN) 500 MG tablet Take 1 tablet (500 mg total) by mouth daily. 06/06/14   Kristen N Ward, DO  metFORMIN (GLUCOPHAGE) 500 MG tablet Take 1 tablet (500 mg total) by mouth 2 (two) times daily with a meal. 11/23/12   Quentin Angst, MD  methocarbamol (ROBAXIN) 500 MG tablet Take 2 tablets (1,000 mg total) by mouth 2 (two) times daily. 11/23/12   Quentin Angst, MD  Omeprazole (PRILOSEC PO) Take by mouth.    Historical Provider, MD  ondansetron (ZOFRAN ODT) 4 MG disintegrating tablet Take 1 tablet (4 mg total) by mouth every 8 (eight) hours as needed for nausea  or vomiting. 06/06/14   Layla Maw Ward, DO  Oxycodone-Acetaminophen (PERCOCET PO) Take by mouth.    Historical Provider, MD  terbinafine (LAMISIL AT) 1 % cream Apply topically 2 (two) times daily. 11/23/12   Quentin Angst, MD  traMADol (ULTRAM) 50 MG tablet Take 1 tablet (50 mg total) by mouth every 6 (six) hours as needed. 03/07/13   Hope Orlene Och, NP  TRAZODONE HCL PO Take by mouth.    Historical Provider, MD   BP 157/98 mmHg  Pulse 86  Temp(Src) 98.2 F (36.8 C) (Oral)  Resp 20  Ht  (1.88 m)  Wt 460 lb (208.655 kg)  BMI 59.04 kg/m2  SpO2 95%  Physical Exam  Constitutional: He is oriented to person, place, and time. He appears well-developed  and well-nourished. No distress.  Morbid obesity  HENT:  Head: Normocephalic and atraumatic.  Mouth/Throat: Oropharynx is clear and moist. No oropharyngeal exudate.  Eyes: Pupils are equal, round, and reactive to light.  Neck: Normal range of motion. Neck supple.  Cardiovascular: Normal rate, regular rhythm and normal heart sounds.  Exam reveals no gallop and no friction rub.   No murmur heard. Pulmonary/Chest: Effort normal and breath sounds normal. No respiratory distress. He has no wheezes. He has no rales.  Abdominal: Soft. Bowel sounds are normal. He exhibits no distension and no mass. There is no tenderness. There is no rebound and no guarding.  Musculoskeletal: Normal range of motion. He exhibits no edema or tenderness.  Neurological: He is alert and oriented to person, place, and time.  Skin: Skin is warm and dry.  Psychiatric: He has a normal mood and affect.  Nursing note and vitals reviewed.   ED Course  Procedures (including critical care time) DIAGNOSTIC STUDIES: Oxygen Saturation is 95% on RA, adequate by my interpretation.    COORDINATION OF CARE: 7:10 PM-Discussed treatment plan which includes CXR, IV fluids, admit to hospital with pt at bedside and pt agreed to plan.    Labs Review Labs Reviewed  BASIC METABOLIC PANEL - Abnormal; Notable for the following:    Potassium 2.9 (*)    Glucose, Bld 196 (*)    Calcium 8.2 (*)    All other components within normal limits  CULTURE, BLOOD (ROUTINE X 2)  CULTURE, BLOOD (ROUTINE X 2)  CBC  I-STAT CG4 LACTIC ACID, ED    Imaging Review Dg Chest 2 View  06/06/2014   CLINICAL DATA:  Cough, fever and congestion  EXAM: CHEST  2 VIEW  COMPARISON:  None.  FINDINGS: Cardiac shadow is within normal limits. Patchy perihilar changes are noted along with more focal infiltrate in the left lower lobe medially. No acute bony abnormality is seen. No sizable effusion is noted.  IMPRESSION: Left lower lobe infiltrate with associated  perihilar infiltrates. Followup films following appropriate therapy are recommended.  Although not mentioned in the body of the report there is a curvilinear density overlying the lower neck. This is not well appreciated on the lateral but may be extrinsic to the patient. Clinical correlation is recommended.   Electronically Signed   By: Alcide Clever M.D.   On: 06/06/2014 11:18     EKG Interpretation None      MDM   Final diagnoses:  Shortness of breath  Community acquired pneumonia    34 year old male here for shortness of breath. Present for the past few days. Was seen yesterday, diagnosed with pneumonia. Well-appearing is at home on Levaquin. He has gotten worse with  more malaise, difficulty breathing, cough. Patient hypoxic on arrival at 88%. On exam he is morbidly obese. He is not moving a lot of air in his lungs, but lung sounds are difficult to assess due to his morbid obesity. We'll plan on cultures, antibiotics, admission.  I personally performed the services described in this documentation, which was scribed in my presence. The recorded information has been reviewed and is accurate.     Elwin MochaBlair Cherl Gorney, MD 06/07/14 [id number]

## 2014-06-07 NOTE — H&P (Signed)
[facility] History and Physical  Avien Taha ZOX:[id number] DOB: Aug 29, 1980 DOA: 06/07/2014  Referring physician: EDP PCP: Maye Hides, PA   Chief Complaint: Cough   HPI: Brian Day is a 34 y.o. male who presents to ED with persistent cough.  Cough is non-productive, has been going on over a week now.  Seen in ED yesterday and put on levaquin which provided no relief.  There is associated SOB, body aches, no sore throat, no rhinitis.  Did have fever and chills this past week.  Review of Systems: Systems reviewed.  As above, otherwise negative  Past Medical History  Diagnosis Date  . Hypertension   . Diabetes mellitus without complication   . Morbid obesity   . Depression   . Anxiety    Past Surgical History  Procedure Laterality Date  . Knee surgery     Social History:  reports that he has been smoking Cigarettes.  He has been smoking about 0.50 packs per day. He does not have any smokeless tobacco history on file. He reports that he does not drink alcohol or use illicit drugs.  Allergies  Allergen Reactions  . Lisinopril     Family History  Problem Relation Age of Onset  . Diabetes Mother   . Hypertension Mother   . Hypertension Father      Prior to Admission medications   Medication Sig Start Date End Date Taking? Authorizing Provider  ALPRAZolam (XANAX PO) Take by mouth.    Historical Provider, MD  DULoxetine HCl (CYMBALTA PO) Take by mouth.    Historical Provider, MD  GABAPENTIN, ONCE-DAILY, PO Take by mouth.    Historical Provider, MD  guaiFENesin-codeine 100-10 MG/5ML syrup Take 5 mLs by mouth every 4 (four) hours as needed for cough. 06/06/14   Kristen N Ward, DO  hydrochlorothiazide (HYDRODIURIL) 25 MG tablet Take 25 mg by mouth daily.    Historical Provider, MD  ibuprofen (ADVIL,MOTRIN) 800 MG tablet Take 1 tablet (800 mg total) by mouth every 8 (eight) hours as needed for mild pain. 06/06/14   Kristen N Ward, DO  levofloxacin (LEVAQUIN)  500 MG tablet Take 1 tablet (500 mg total) by mouth daily. 06/06/14   Kristen N Ward, DO  metFORMIN (GLUCOPHAGE) 500 MG tablet Take 1 tablet (500 mg total) by mouth 2 (two) times daily with a meal. 11/23/12   Quentin Angst, MD  methocarbamol (ROBAXIN) 500 MG tablet Take 2 tablets (1,000 mg total) by mouth 2 (two) times daily. 11/23/12   Quentin Angst, MD  Omeprazole (PRILOSEC PO) Take by mouth.    Historical Provider, MD  ondansetron (ZOFRAN ODT) 4 MG disintegrating tablet Take 1 tablet (4 mg total) by mouth every 8 (eight) hours as needed for nausea or vomiting. 06/06/14   Layla Maw Ward, DO  Oxycodone-Acetaminophen (PERCOCET PO) Take by mouth.    Historical Provider, MD  terbinafine (LAMISIL AT) 1 % cream Apply topically 2 (two) times daily. 11/23/12   Quentin Angst, MD  traMADol (ULTRAM) 50 MG tablet Take 1 tablet (50 mg total) by mouth every 6 (six) hours as needed. 03/07/13   Hope Orlene Och, NP  TRAZODONE HCL PO Take by mouth.    Historical Provider, MD   Physical Exam: Filed Vitals:   06/07/14 2320  BP: 169/95  Pulse: 81  Temp: 97.8 F (36.6 C)  Resp: 28    BP 169/95 mmHg  Pulse 81  Temp(Src) 97.8 F (36.6 C) (Oral)  Resp 28  Ht 6'  2" (1.88 m)  Wt 208.655 kg (460 lb)  BMI 59.04 kg/m2  SpO2 92%  General Appearance:    Alert, oriented, no distress, appears stated age  Head:    Normocephalic, atraumatic  Eyes:    PERRL, EOMI, sclera non-icteric        Nose:   Nares without drainage or epistaxis. Mucosa, turbinates normal  Throat:   Moist mucous membranes. Oropharynx without erythema or exudate.  Neck:   Supple. No carotid bruits.  No thyromegaly.  No lymphadenopathy.   Back:     No CVA tenderness, no spinal tenderness  Lungs:     Clear to auscultation bilaterally, without wheezes, rhonchi or rales  Chest wall:    No tenderness to palpitation  Heart:    Regular rate and rhythm without murmurs, gallops, rubs  Abdomen:     Soft, non-tender, nondistended, normal bowel  sounds, no organomegaly  Genitalia:    deferred  Rectal:    deferred  Extremities:   No clubbing, cyanosis or edema.  Pulses:   2+ and symmetric all extremities  Skin:   Skin color, texture, turgor normal, no rashes or lesions  Lymph nodes:   Cervical, supraclavicular, and axillary nodes normal  Neurologic:   CNII-XII intact. Normal strength, sensation and reflexes      throughout    Labs on Admission:  Basic Metabolic Panel:  Recent Labs Lab 06/07/14 1920  NA 138  K 2.9*  CL 98  CO2 31  GLUCOSE 196*  BUN 8  CREATININE 0.77  CALCIUM 8.2*   Liver Function Tests: No results for input(s): AST, ALT, ALKPHOS, BILITOT, PROT, ALBUMIN in the last 168 hours. No results for input(s): LIPASE, AMYLASE in the last 168 hours. No results for input(s): AMMONIA in the last 168 hours. CBC:  Recent Labs Lab 06/07/14 1920  WBC 5.2  HGB 13.9  HCT 39.4  MCV 93.1  PLT 201   Cardiac Enzymes: No results for input(s): CKTOTAL, CKMB, CKMBINDEX, TROPONINI in the last 168 hours.  BNP (last 3 results) No results for input(s): PROBNP in the last 8760 hours. CBG: No results for input(s): GLUCAP in the last 168 hours.  Radiological Exams on Admission: Dg Chest 2 View  06/07/2014   CLINICAL DATA:  Shortness of breath and mid chest pain.  Fever  EXAM: CHEST  2 VIEW  COMPARISON:  06/06/2014  FINDINGS: Central bronchitic change and persistent subtle retrocardiac opacity. No effusion or pneumothorax. Normal heart size and aortic contours.  IMPRESSION: Bronchitis and possible retrocardiac pneumonia, stable from yesterday.   Electronically Signed   By: Marnee SpringJonathon  Watts M.D.   On: 06/07/2014 20:24   Dg Chest 2 View  06/06/2014   CLINICAL DATA:  Cough, fever and congestion  EXAM: CHEST  2 VIEW  COMPARISON:  None.  FINDINGS: Cardiac shadow is within normal limits. Patchy perihilar changes are noted along with more focal infiltrate in the left lower lobe medially. No acute bony abnormality is seen. No  sizable effusion is noted.  IMPRESSION: Left lower lobe infiltrate with associated perihilar infiltrates. Followup films following appropriate therapy are recommended.  Although not mentioned in the body of the report there is a curvilinear density overlying the lower neck. This is not well appreciated on the lateral but may be extrinsic to the patient. Clinical correlation is recommended.   Electronically Signed   By: Alcide CleverMark  Lukens M.D.   On: 06/06/2014 11:18    EKG: Independently reviewed.  Assessment/Plan Principal Problem:   CAP (  community acquired pneumonia) Active Problems:   Accelerated hypertension   Diabetes   1. CAP - patient with retrocardiac infiltrate, failed outpatient treatment, new O2 requirement, desats to 88% with movement. 1. PNA pathway 2. Rocephin and azithromycin 3. Cultures pending 4. Suppress cough 2. DM2 - continue metformin, carb mod diet 3. HTN - continue HCTZ    Code Status: Full Code  Family Communication: No family in room Disposition Plan: Admit to inpatient   Time spent: 70 min  Aloura Matsuoka M. [facility] Pager [phone]  If 7AM-7PM, please contact the day team taking care of the patient [email or web address] Password [facility] [address] 06/07/2014, 11:24 PM

## 2014-06-07 NOTE — ED Notes (Signed)
Pt placed on oxygen via [place] at 3L with improvement in oxygen sat to 96%

## 2014-06-07 NOTE — ED Notes (Signed)
Pt placed on 2 lpm via [place] 

## 2014-06-08 DIAGNOSIS — J1100 Influenza due to unidentified influenza virus with unspecified type of pneumonia: Principal | ICD-10-CM

## 2014-06-08 DIAGNOSIS — A047 Enterocolitis due to Clostridium difficile: Secondary | ICD-10-CM

## 2014-06-08 DIAGNOSIS — A0472 Enterocolitis due to Clostridium difficile, not specified as recurrent: Secondary | ICD-10-CM

## 2014-06-08 DIAGNOSIS — E876 Hypokalemia: Secondary | ICD-10-CM

## 2014-06-08 LAB — HIV ANTIBODY (ROUTINE TESTING W REFLEX): HIV Screen 4th Generation wRfx: NONREACTIVE

## 2014-06-08 LAB — BASIC METABOLIC PANEL
Anion gap: 11 (ref 5–15)
BUN: 6 mg/dL (ref 6–23)
CO2: 28 mmol/L (ref 19–32)
CREATININE: 0.73 mg/dL (ref 0.50–1.35)
Calcium: 8.8 mg/dL (ref 8.4–10.5)
Chloride: 96 mmol/L (ref 96–112)
GFR calc non Af Amer: >90 mL/min (ref >90)
Glucose, Bld: 197 mg/dL — ABNORMAL HIGH (ref 70–99)
Potassium: 2.9 mmol/L — ABNORMAL LOW (ref 3.5–5.1)
Sodium: 135 mmol/L (ref 135–145)

## 2014-06-08 LAB — CLOSTRIDIUM DIFFICILE BY PCR: CDIFFPCR: POSITIVE — AB

## 2014-06-08 LAB — GLUCOSE, CAPILLARY
GLUCOSE-CAPILLARY: 201 mg/dL — AB (ref 70–99)
Glucose-Capillary: 184 mg/dL — ABNORMAL HIGH (ref 70–99)
Glucose-Capillary: 225 mg/dL — ABNORMAL HIGH (ref 70–99)
Glucose-Capillary: 191 mg/dL — ABNORMAL HIGH (ref 70–99)

## 2014-06-08 LAB — INFLUENZA PANEL BY PCR (TYPE A & B)
H1N1FLUPCR: NOT DETECTED
INFLBPCR: NEGATIVE
Influenza A By PCR: POSITIVE — AB

## 2014-06-08 LAB — STREP PNEUMONIAE URINARY ANTIGEN: Strep Pneumo Urinary Antigen: NEGATIVE

## 2014-06-08 MED ORDER — POTASSIUM CHLORIDE CRYS ER 20 MEQ PO TBCR
40.0000 meq | EXTENDED_RELEASE_TABLET | ORAL | Status: AC
  Administered 2014-06-08 (×2): 40 meq via ORAL
  Filled 2014-06-08 (×2): qty 2

## 2014-06-08 MED ORDER — INSULIN ASPART 100 UNIT/ML ~~LOC~~ SOLN
0.0000 [IU] | Freq: Three times a day (TID) | SUBCUTANEOUS | Status: DC
  Administered 2014-06-08: 5 [IU] via SUBCUTANEOUS
  Administered 2014-06-08: 3 [IU] via SUBCUTANEOUS
  Administered 2014-06-08: 5 [IU] via SUBCUTANEOUS
  Administered 2014-06-09 (×2): 3 [IU] via SUBCUTANEOUS

## 2014-06-08 MED ORDER — OXYCODONE-ACETAMINOPHEN 5-325 MG PO TABS: 1.0000 | ORAL_TABLET | Freq: Four times a day (QID) | ORAL | Status: DC

## 2014-06-08 MED ORDER — METRONIDAZOLE 500 MG PO TABS
500.0000 mg | ORAL_TABLET | Freq: Three times a day (TID) | ORAL | Status: DC
  Administered 2014-06-08 – 2014-06-09 (×5): 500 mg via ORAL
  Filled 2014-06-08 (×6): qty 1

## 2014-06-08 MED ORDER — OSELTAMIVIR PHOSPHATE 75 MG PO CAPS
75.0000 mg | ORAL_CAPSULE | Freq: Two times a day (BID) | ORAL | Status: DC
  Administered 2014-06-08 – 2014-06-09 (×3): 75 mg via ORAL
  Filled 2014-06-08 (×4): qty 1

## 2014-06-08 MED ORDER — OXYCODONE-ACETAMINOPHEN 5-325 MG PO TABS
1.0000 | ORAL_TABLET | Freq: Four times a day (QID) | ORAL | Status: DC
  Administered 2014-06-08 – 2014-06-09 (×5): 1 via ORAL
  Filled 2014-06-08 (×5): qty 1

## 2014-06-08 MED ORDER — TRAZODONE HCL 150 MG PO TABS
400.0000 mg | ORAL_TABLET | Freq: Every day | ORAL | Status: DC
  Administered 2014-06-08 (×2): 400 mg via ORAL
  Filled 2014-06-08 (×3): qty 1

## 2014-06-08 MED ORDER — OXYCODONE HCL 5 MG PO TABS
5.0000 mg | ORAL_TABLET | Freq: Four times a day (QID) | ORAL | Status: DC
  Administered 2014-06-08 – 2014-06-09 (×5): 5 mg via ORAL
  Filled 2014-06-08 (×5): qty 1

## 2014-06-08 MED ORDER — OXYCODONE HCL 5 MG PO TABS: 5.0000 mg | ORAL_TABLET | Freq: Four times a day (QID) | ORAL | Status: DC

## 2014-06-08 MED ORDER — NICOTINE 14 MG/24HR TD PT24
14.0000 mg | MEDICATED_PATCH | Freq: Every day | TRANSDERMAL | Status: DC
  Filled 2014-06-08 (×2): qty 1

## 2014-06-08 NOTE — Progress Notes (Signed)
Per Micro, pt C.diff positive, Rizwan MD made aware.

## 2014-06-08 NOTE — Progress Notes (Signed)
Nutrition Brief Note  Patient identified on the Malnutrition Screening Tool (MST) Report.  Wt Readings from Last 15 Encounters:  06/07/14 469 lb 9.6 oz (213.009 kg)  06/06/14 466 lb (211.376 kg)  02/15/14 466 lb (211.376 kg)  03/07/13 495 lb (224.531 kg)  11/23/12 486 lb (220.448 kg)  11/07/12 470 lb 3 oz (213.276 kg)    Body mass index is 60.27 kg/(m^2). Patient meets criteria for class 3, extreme/morbid obesity based on current BMI.   Current diet order is CHO-modified, patient is consuming approximately 100% of meals at this time. Labs and medications reviewed.   No nutrition interventions warranted at this time. If nutrition issues arise, please consult RD.   Joaquin CourtsKimberly Ryonna Cimini, RD, LDN, CNSC Pager [phone] After Hours Pager [phone]

## 2014-06-08 NOTE — Progress Notes (Addendum)
[facility] Progress Note   Brian Day [id number] DOB: 20-Jun-1980 DOA: 06/07/2014 PCP: Brian Day  Brief narrative: Brian Day is a 34 y.o. male male who presents to ED with persistent cough. Cough is non-productive, has been going on over a week now. Seen in ED yesterday and put on levaquin which provided no relief.  Subjective: Having diarrhea this AM. Cough continues.   Assessment/Plan: Principal Problem:   CAP (community acquired pneumonia)- hypoxic resp failure - requiring 3 L of O2  with pulse ox of 93% - influenza positive- stop antibiotics- start Tamiflu -  Active Problems:   c diff colitis - start oral Flagyl and follow for tolerance and resolution of diarrhea  Hypokalemia - replace and recheck    DM - diet controlled at home    Appt with PCP: requested Code Status: full code Family Communication:  Disposition Plan: home when stable DVT prophylaxis: heparin Consultants: Procedures:  Antibiotics: Anti-infectives    Start     Dose/Rate Route Frequency Ordered Stop   06/08/14 2000  azithromycin (ZITHROMAX) tablet 500 mg  Status:  Discontinued     500 mg Oral Every 24 hours 06/07/14 2323 06/08/14 1145   06/08/14 1200  oseltamivir (TAMIFLU) capsule 75 mg     75 mg Oral 2 times daily 06/08/14 1141 06/13/14 0959   06/08/14 1045  metroNIDAZOLE (FLAGYL) tablet 500 mg     500 mg Oral 3 times daily 06/08/14 1038     06/07/14 2325  cefTRIAXone (ROCEPHIN) 1 g in dextrose 5 % 50 mL IVPB - Premix  Status:  Discontinued     1 g 100 mL/hr over 30 Minutes Intravenous Every 24 hours 06/07/14 2323 06/08/14 1145   06/07/14 2000  cefTRIAXone (ROCEPHIN) 1 g in dextrose 5 % 50 mL IVPB  Status:  Discontinued     1 g 100 mL/hr over 30 Minutes Intravenous  Once 06/07/14 1959 06/07/14 2326   06/07/14 2000  azithromycin (ZITHROMAX) 500 mg in dextrose 5 % 250 mL IVPB     500 mg 250 mL/hr over 60 Minutes Intravenous  Once 06/07/14 1959 06/07/14 2233   06/07/14 2000  levofloxacin (LEVAQUIN) IVPB 750 mg     750 mg 100 mL/hr over 90 Minutes Intravenous  Once 06/07/14 2000 06/07/14 2133      Objective: Filed Weights   06/07/14 1851 06/07/14 2327  Weight: 208.655 kg (460 lb) 213.009 kg (469 lb 9.6 oz)    Intake/Output Summary (Last 24 hours) at 06/08/14 1239 Last data filed at 06/08/14 1021  Gross per 24 hour  Intake   1260 ml  Output    600 ml  Net    660 ml     Vitals Filed Vitals:   06/07/14 2134 06/07/14 2320 06/07/14 2327 06/08/14 0551  BP: 138/81 169/95  152/96  Pulse: 81 81  77  Temp: 98 F (36.7 C) 97.8 F (36.6 C)  97.5 F (36.4 C)  TempSrc:  Oral  Oral  Resp: 22 28  15   Height:   6\' 2"  (1.88 m)   Weight:   213.009 kg (469 lb 9.6 oz)   SpO2: 96% 92%  93%    Exam:  General:  Pt is alert, not in acute distress  HEENT: No icterus, No thrush  Cardiovascular: regular rate and rhythm, S1/S2 No murmur  Respiratory: clear to auscultation bilaterally   Abdomen: Soft, +Bowel sounds, non tender, non distended, no guarding  MSK: No LE edema, cyanosis or clubbing  Data  Reviewed: Basic Metabolic Panel:  Recent Labs Lab 06/07/14 1920  NA 138  K 2.9*  CL 98  CO2 31  GLUCOSE 196*  BUN 8  CREATININE 0.77  CALCIUM 8.2*   Liver Function Tests: No results for input(s): AST, ALT, ALKPHOS, BILITOT, PROT, ALBUMIN in the last 168 hours. No results for input(s): LIPASE, AMYLASE in the last 168 hours. No results for input(s): AMMONIA in the last 168 hours. CBC:  Recent Labs Lab 06/07/14 1920  WBC 5.2  HGB 13.9  HCT 39.4  MCV 93.1  PLT 201   Cardiac Enzymes: No results for input(s): CKTOTAL, CKMB, CKMBINDEX, TROPONINI in the last 168 hours. BNP (last 3 results) No results for input(s): BNP in the last 8760 hours.  ProBNP (last 3 results) No results for input(s): PROBNP in the last 8760 hours.  CBG:  Recent Labs Lab 06/07/14 2338 06/08/14 0805 06/08/14 1205  GLUCAP 217* 184* 225*    Recent  Results (from the past 240 hour(s))  Clostridium Difficile by PCR     Status: Abnormal   Collection Time: 06/08/14  3:53 AM  Result Value Ref Range Status   C difficile by pcr POSITIVE (A) NEGATIVE Final    Comment: CRITICAL RESULT CALLED TO, READ BACK BY AND VERIFIED WITH: C.FLORES,RN 06/08/14  BY V.WILKINS      Studies:  Recent x-ray studies have been reviewed in detail by the Attending Physician  Scheduled Meds:  Scheduled Meds: . antiseptic oral rinse  7 mL Mouth Rinse BID  . heparin  5,000 Units Subcutaneous 3 times per day  . hydrochlorothiazide  25 mg Oral Daily  . insulin aspart  0-15 Units Subcutaneous TID WC  . metroNIDAZOLE  500 mg Oral TID  . nicotine  14 mg Transdermal Daily  . oseltamivir  75 mg Oral BID  . traZODone  400 mg Oral QHS   Continuous Infusions:   Time spent on care of this patient: 35 min   Brian Molner, MD 06/08/2014, 12:39 PM  LOS: 1 day   [facility] Office  [phone] Pager - Text Page per [email or web address]  If 7PM-7AM, please contact night-coverage [email or web address]

## 2014-06-08 NOTE — Progress Notes (Signed)
Inpatient Diabetes Program Recommendations  AACE/ADA: New Consensus Statement on Inpatient Glycemic Control (2013)  Target Ranges:  Prepandial:   less than 140 mg/dL      Peak postprandial:   less than 180 mg/dL (1-2 hours)      Critically ill patients:  140 - 180 mg/dL   Inpatient Diabetes Program Recommendations HgbA1C: order to assess prehospital glucose control  Thank you  Kaysee Hergert BSN, RN,CDE Inpatient Diabetes Coordinator [phone] (team pager)  

## 2014-06-08 NOTE — Progress Notes (Signed)
Utilization review completed. Fuquan Wilson, RN, BSN. 

## 2014-06-09 LAB — LEGIONELLA ANTIGEN, URINE

## 2014-06-09 LAB — BASIC METABOLIC PANEL
Anion gap: 12 (ref 5–15)
Anion gap: 10 (ref 5–15)
BUN: 8 mg/dL (ref 6–23)
BUN: 8 mg/dL (ref 6–23)
CALCIUM: 8.8 mg/dL (ref 8.4–10.5)
CALCIUM: 9.1 mg/dL (ref 8.4–10.5)
CO2: 29 mmol/L (ref 19–32)
CO2: 28 mmol/L (ref 19–32)
Chloride: 97 mmol/L (ref 96–112)
Chloride: 98 mmol/L (ref 96–112)
Creatinine, Ser: 0.76 mg/dL (ref 0.50–1.35)
Creatinine, Ser: 0.81 mg/dL (ref 0.50–1.35)
GFR calc Af Amer: >90 mL/min (ref >90)
GFR calc Af Amer: >90 mL/min (ref >90)
GFR calc non Af Amer: >90 mL/min (ref >90)
GLUCOSE: 188 mg/dL — AB (ref 70–99)
Glucose, Bld: 175 mg/dL — ABNORMAL HIGH (ref 70–99)
Potassium: 2.8 mmol/L — ABNORMAL LOW (ref 3.5–5.1)
Potassium: 3.8 mmol/L (ref 3.5–5.1)
SODIUM: 136 mmol/L (ref 135–145)
Sodium: 138 mmol/L (ref 135–145)

## 2014-06-09 LAB — GLUCOSE, CAPILLARY
GLUCOSE-CAPILLARY: 194 mg/dL — AB (ref 70–99)
Glucose-Capillary: 160 mg/dL — ABNORMAL HIGH (ref 70–99)
Glucose-Capillary: 180 mg/dL — ABNORMAL HIGH (ref 70–99)

## 2014-06-09 MED ORDER — MAGNESIUM SULFATE 2 GM/50ML IV SOLN
2.0000 g | Freq: Once | INTRAVENOUS | Status: AC
Start: 2014-06-09 — End: 2014-06-09
  Administered 2014-06-09: 2 g via INTRAVENOUS
  Filled 2014-06-09: qty 50

## 2014-06-09 MED ORDER — IBUPROFEN 400 MG PO TABS: 800.0000 mg | ORAL_TABLET | Freq: Three times a day (TID) | ORAL | Status: DC | PRN

## 2014-06-09 MED ORDER — OSELTAMIVIR PHOSPHATE 75 MG PO CAPS: 75.0000 mg | ORAL_CAPSULE | Freq: Two times a day (BID) | ORAL | Status: DC

## 2014-06-09 MED ORDER — METRONIDAZOLE 500 MG PO TABS: 500.0000 mg | ORAL_TABLET | Freq: Three times a day (TID) | ORAL | Status: DC

## 2014-06-09 MED ORDER — POTASSIUM CHLORIDE CRYS ER 20 MEQ PO TBCR
40.0000 meq | EXTENDED_RELEASE_TABLET | ORAL | Status: AC
  Administered 2014-06-09 (×2): 40 meq via ORAL
  Filled 2014-06-09 (×2): qty 2

## 2014-06-09 NOTE — Progress Notes (Signed)
Brian Day to be D/C'd Home per MD order.  Discussed with the patient and all questions fully answered.  VSS, Skin clean, dry and intact without evidence of skin break down, no evidence of skin tears noted. IV catheter discontinued intact. Site without signs and symptoms of complications. Dressing and pressure applied.  An After Visit Summary was printed and given to the patient. Patient received prescription.  D/c education completed with patient/family including follow up instructions, medication list, d/c activities limitations if indicated, with other d/c instructions as indicated by MD - patient able to verbalize understanding, all questions fully answered.   Patient instructed to return to ED, call 911, or call MD for any changes in condition.   Patient escorted via WC, and D/C home via private auto.  [name], [name] 06/09/2014 4:59 PM

## 2014-06-09 NOTE — Discharge Summary (Signed)
Physician Discharge Summary  Brian BeetsJohn Day [id number] DOB: Aug 15, 1980 DOA: 06/07/2014  PCP: Brian HidesMILLER, Brian DEAN, PA  Admit date: 06/07/2014 Discharge date: 06/09/2014  Time spent: 45 minutes    Discharge Condition: stable Diet recommendation: low sodium, heart healthy  Discharge Diagnoses:  Principal Problem:   Influenza with pneumonia Active Problems:   Hypokalemia   Diabetes type 2 - Diet controlled   CAP (community acquired pneumonia)   C. difficile colitis   History of present illness:  Brian BeetsJohn Day is a 34 y.o. male male who presents to ED with persistent cough. Cough is non-productive, has been going on over a week now. Seen in ED on the Day before admission and put on levaquin which provided no relief.  Hospital Course:  Principal Problem:  CAP (community acquired pneumonia)- hypoxic resp failure - requiring 3 L of O2 with pulse ox of 93% on admission but now on room air - influenza positive- stop antibiotics- started Tamiflu - - now on room air with pulse ox> 95% - no dyspnea or cough  Active Problems:  c diff colitis - started oral Flagyl- last episode of diarrhea yesterday - should complete a 10 Day course  Hypokalemia - replaced    DM - diet controlled at home   Discharge Exam: Filed Weights   06/07/14 1851 06/07/14 2327  Weight: 208.655 kg (460 lb) 213.009 kg (469 lb 9.6 oz)   Filed Vitals:   06/09/14 0621  BP: 157/81  Pulse: 72  Temp: 98.1 F (36.7 C)  Resp: 24    General: AAO x 3, no distress Cardiovascular: RRR, no murmurs  Respiratory: clear to auscultation bilaterally GI: soft, non-tender, non-distended, bowel sound positive  Discharge Instructions You were cared for by a hospitalist during your hospital stay. If you have any questions about your discharge medications or the care you received while you were in the hospital after you are discharged, you can call the unit and asked to speak with the hospitalist on call if the  hospitalist that took care of you is not available. Once you are discharged, your primary care physician will handle any further medical issues. Please note that NO REFILLS for any discharge medications will be authorized once you are discharged, as it is imperative that you return to your primary care physician (or establish a relationship with a primary care physician if you do not have one) for your aftercare needs so that they can reassess your need for medications and monitor your lab values.      Discharge Instructions    Diet - low sodium heart healthy    Complete by:  As directed      Increase activity slowly    Complete by:  As directed             Medication List    STOP taking these medications        levofloxacin 500 MG tablet  Commonly known as:  LEVAQUIN     methocarbamol 500 MG tablet  Commonly known as:  ROBAXIN     ondansetron 4 MG disintegrating tablet  Commonly known as:  ZOFRAN ODT     terbinafine 1 % cream  Commonly known as:  LAMISIL AT      TAKE these medications        guaiFENesin-codeine 100-10 MG/5ML syrup  Take 5 mLs by mouth every 4 (four) hours as needed for cough.     hydrochlorothiazide 25 MG tablet  Commonly known as:  HYDRODIURIL  Take 25  mg by mouth daily.     ibuprofen 400 MG tablet  Commonly known as:  ADVIL,MOTRIN  Take 2 tablets (800 mg total) by mouth every 8 (eight) hours as needed for mild pain.     metroNIDAZOLE 500 MG tablet  Commonly known as:  FLAGYL  Take 1 tablet (500 mg total) by mouth 3 (three) times daily.     oseltamivir 75 MG capsule  Commonly known as:  TAMIFLU  Take 1 capsule (75 mg total) by mouth 2 (two) times daily.     oxyCODONE-acetaminophen 10-325 MG per tablet  Commonly known as:  PERCOCET  Take 1 tablet by mouth 4 (four) times daily.     PRILOSEC PO  Take by mouth.       Allergies  Allergen Reactions  . Lisinopril    Follow-up Information    Follow up with Brian Hides, PA On 06/15/2014.    Specialty:  Physician Assistant   Why:  APPT scheduled for March 24th, 2016 @ 09:45am with Brian Day P.A. @ [phone]. PLEASE REMEMBER TO BRING YOUR DICHARGE PAPERWORK.   Contact information:   [address] [place] [address] [phone]        The results of significant diagnostics from this hospitalization (including imaging, microbiology, ancillary and laboratory) are listed below for reference.    Significant Diagnostic Studies: Dg Chest 2 View  06/07/2014   CLINICAL DATA:  Shortness of breath and mid chest pain.  Fever  EXAM: CHEST  2 VIEW  COMPARISON:  06/06/2014  FINDINGS: Central bronchitic change and persistent subtle retrocardiac opacity. No effusion or pneumothorax. Normal heart size and aortic contours.  IMPRESSION: Bronchitis and possible retrocardiac pneumonia, stable from yesterday.   Electronically Signed   By: Marnee Spring M.D.   On: 06/07/2014 20:24   Dg Chest 2 View  06/06/2014   CLINICAL DATA:  Cough, fever and congestion  EXAM: CHEST  2 VIEW  COMPARISON:  None.  FINDINGS: Cardiac shadow is within normal limits. Patchy perihilar changes are noted along with more focal infiltrate in the left lower lobe medially. No acute bony abnormality is seen. No sizable effusion is noted.  IMPRESSION: Left lower lobe infiltrate with associated perihilar infiltrates. Followup films following appropriate therapy are recommended.  Although not mentioned in the body of the report there is a curvilinear density overlying the lower neck. This is not well appreciated on the lateral but may be extrinsic to the patient. Clinical correlation is recommended.   Electronically Signed   By: Alcide Clever M.D.   On: 06/06/2014 11:18    Microbiology: Recent Results (from the past 240 hour(s))  Blood culture (routine x 2)     Status: None (Preliminary result)   Collection Time: 06/07/14  7:20 PM  Result Value Ref Range Status   Specimen Description BLOOD RIGHT FOREARM  Final   Special  Requests BOTTLES DRAWN AEROBIC AND ANAEROBIC 5CC EACH  Final   Culture   Final           BLOOD CULTURE RECEIVED NO GROWTH TO DATE CULTURE WILL BE HELD FOR 5 DAYS BEFORE ISSUING A FINAL NEGATIVE REPORT Performed at [facility]    Report Status PENDING  Incomplete  Blood culture (routine x 2)     Status: None (Preliminary result)   Collection Time: 06/07/14  7:25 PM  Result Value Ref Range Status   Specimen Description BLOOD RIGHT FOREARM  Final   Special Requests BOTTLES DRAWN AEROBIC AND ANAEROBIC 5CC  EACH  Final   Culture   Final           BLOOD CULTURE RECEIVED NO GROWTH TO DATE CULTURE WILL BE HELD FOR 5 DAYS BEFORE ISSUING A FINAL NEGATIVE REPORT Performed at [facility]    Report Status PENDING  Incomplete  Clostridium Difficile by PCR     Status: Abnormal   Collection Time: 06/08/14  3:53 AM  Result Value Ref Range Status   C difficile by pcr POSITIVE (A) NEGATIVE Final    Comment: CRITICAL RESULT CALLED TO, READ BACK BY AND VERIFIED WITH: C.FLORES,RN 06/08/14  BY V.WILKINS      Labs: Basic Metabolic Panel:  Recent Labs Lab 06/07/14 1920 06/08/14 1146 06/09/14 0606 06/09/14 1324  NA 138 135 138 136  K 2.9* 2.9* 2.8* 3.8  CL 98 96 97 98  CO2 GLUCOSE 196* 197* 175* 188*  BUN CREATININE 0.77 0.73 0.76 0.81  CALCIUM 8.2* 8.8 8.8 9.1   Liver Function Tests: No results for input(s): AST, ALT, ALKPHOS, BILITOT, PROT, ALBUMIN in the last 168 hours. No results for input(s): LIPASE, AMYLASE in the last 168 hours. No results for input(s): AMMONIA in the last 168 hours. CBC:  Recent Labs Lab 06/07/14 1920  WBC 5.2  HGB 13.9  HCT 39.4  MCV 93.1  PLT 201   Cardiac Enzymes: No results for input(s): CKTOTAL, CKMB, CKMBINDEX, TROPONINI in the last 168 hours. BNP: BNP (last 3 results) No results for input(s): BNP in the last 8760 hours.  ProBNP (last 3 results) No results for input(s): PROBNP in the last 8760  hours.  CBG:  Recent Labs Lab 06/08/14 1205 06/08/14 1705 06/08/14 2156 06/09/14 0801 06/09/14 1203  GLUCAP 225* 201* 191* 160* 180*       SignedCalvert Cantor, MD [facility] 06/09/2014, 3:12 PM

## 2014-06-14 LAB — CULTURE, BLOOD (ROUTINE X 2)
Culture: NO GROWTH
Culture: NO GROWTH

## 2014-08-23 ENCOUNTER — Emergency Department (HOSPITAL_BASED_OUTPATIENT_CLINIC_OR_DEPARTMENT_OTHER): Payer: Medicaid Other

## 2014-08-23 ENCOUNTER — Encounter (HOSPITAL_BASED_OUTPATIENT_CLINIC_OR_DEPARTMENT_OTHER): Payer: Self-pay | Admitting: Emergency Medicine

## 2014-08-23 ENCOUNTER — Emergency Department (HOSPITAL_BASED_OUTPATIENT_CLINIC_OR_DEPARTMENT_OTHER)
Admission: EM | Admit: 2014-08-23 | Discharge: 2014-08-23 | Disposition: A | Discharge status: Home / Self Care | Payer: Medicaid Other | Attending: Emergency Medicine | Admitting: Emergency Medicine

## 2014-08-23 DIAGNOSIS — E119 Type 2 diabetes mellitus without complications: Secondary | ICD-10-CM | POA: Insufficient documentation

## 2014-08-23 DIAGNOSIS — J209 Acute bronchitis, unspecified: Secondary | ICD-10-CM | POA: Insufficient documentation

## 2014-08-23 DIAGNOSIS — I10 Essential (primary) hypertension: Secondary | ICD-10-CM | POA: Insufficient documentation

## 2014-08-23 DIAGNOSIS — Z720 Tobacco use: Secondary | ICD-10-CM | POA: Diagnosis not present

## 2014-08-23 DIAGNOSIS — Z79899 Other long term (current) drug therapy: Secondary | ICD-10-CM | POA: Diagnosis not present

## 2014-08-23 DIAGNOSIS — Z792 Long term (current) use of antibiotics: Secondary | ICD-10-CM | POA: Insufficient documentation

## 2014-08-23 DIAGNOSIS — R062 Wheezing: Secondary | ICD-10-CM

## 2014-08-23 DIAGNOSIS — Z8659 Personal history of other mental and behavioral disorders: Secondary | ICD-10-CM | POA: Diagnosis not present

## 2014-08-23 DIAGNOSIS — J40 Bronchitis, not specified as acute or chronic: Secondary | ICD-10-CM

## 2014-08-23 DIAGNOSIS — R05 Cough: Secondary | ICD-10-CM | POA: Diagnosis present

## 2014-08-23 LAB — CBC WITH DIFFERENTIAL/PLATELET
Basophils Absolute: 0 10*3/uL (ref 0.0–0.1)
Basophils Relative: 0 % (ref 0–1)
Eosinophils Absolute: 0.1 10*3/uL (ref 0.0–0.7)
Eosinophils Relative: 1 % (ref 0–5)
HCT: 44.8 % (ref 39.0–52.0)
HEMOGLOBIN: 15.8 g/dL (ref 13.0–17.0)
Lymphocytes Relative: 27 % (ref 12–46)
Lymphs Abs: 3 10*3/uL (ref 0.7–4.0)
MCH: 31.1 pg (ref 26.0–34.0)
MCHC: 35.3 g/dL (ref 30.0–36.0)
MCV: 88.2 fL (ref 78.0–100.0)
Monocytes Absolute: 0.9 10*3/uL (ref 0.1–1.0)
Monocytes Relative: 8 % (ref 3–12)
NEUTROS ABS: 7.1 10*3/uL (ref 1.7–7.7)
NEUTROS PCT: 64 % (ref 43–77)
Platelets: 270 10*3/uL (ref 150–400)
RBC: 5.08 MIL/uL (ref 4.22–5.81)
RDW: 12.2 % (ref 11.5–15.5)
WBC: 11.1 10*3/uL — ABNORMAL HIGH (ref 4.0–10.5)

## 2014-08-23 LAB — COMPREHENSIVE METABOLIC PANEL
ALBUMIN: 3.4 g/dL — AB (ref 3.5–5.0)
ALT: 24 U/L (ref 17–63)
AST: 25 U/L (ref 15–41)
Alkaline Phosphatase: 54 U/L (ref 38–126)
Anion gap: 11 (ref 5–15)
BUN: 11 mg/dL (ref 6–20)
CO2: 30 mmol/L (ref 22–32)
Calcium: 8.8 mg/dL — ABNORMAL LOW (ref 8.9–10.3)
Chloride: 92 mmol/L — ABNORMAL LOW (ref 101–111)
Creatinine, Ser: 0.75 mg/dL (ref 0.61–1.24)
GFR calc non Af Amer: >60 mL/min (ref >60)
Glucose, Bld: 308 mg/dL — ABNORMAL HIGH (ref 65–99)
Potassium: 3.1 mmol/L — ABNORMAL LOW (ref 3.5–5.1)
Sodium: 133 mmol/L — ABNORMAL LOW (ref 135–145)
Total Bilirubin: 0.6 mg/dL (ref 0.3–1.2)
Total Protein: 7.8 g/dL (ref 6.5–8.1)

## 2014-08-23 LAB — LIPASE, BLOOD: LIPASE: 10 U/L — AB (ref 22–51)

## 2014-08-23 MED ORDER — SODIUM CHLORIDE 0.9 % IV BOLUS (SEPSIS)
1000.0000 mL | Freq: Once | INTRAVENOUS | Status: AC
Start: 2014-08-23 — End: 2014-08-23
  Administered 2014-08-23: 1000 mL via INTRAVENOUS

## 2014-08-23 MED ORDER — ALBUTEROL SULFATE (2.5 MG/3ML) 0.083% IN NEBU
5.0000 mg | INHALATION_SOLUTION | Freq: Once | RESPIRATORY_TRACT | Status: AC
  Administered 2014-08-23: 5 mg via RESPIRATORY_TRACT
  Filled 2014-08-23: qty 6

## 2014-08-23 MED ORDER — IPRATROPIUM BROMIDE 0.02 % IN SOLN
0.5000 mg | Freq: Once | RESPIRATORY_TRACT | Status: AC
  Administered 2014-08-23: 0.5 mg via RESPIRATORY_TRACT
  Filled 2014-08-23: qty 2.5

## 2014-08-23 MED ORDER — ONDANSETRON HCL 4 MG/2ML IJ SOLN
4.0000 mg | Freq: Once | INTRAMUSCULAR | Status: AC
  Administered 2014-08-23: 4 mg via INTRAVENOUS
  Filled 2014-08-23: qty 2

## 2014-08-23 MED ORDER — AZITHROMYCIN 250 MG PO TABS
500.0000 mg | ORAL_TABLET | Freq: Once | ORAL | Status: AC
  Administered 2014-08-23: 500 mg via ORAL
  Filled 2014-08-23: qty 2

## 2014-08-23 MED ORDER — AZITHROMYCIN 250 MG PO TABS: 250.0000 mg | ORAL_TABLET | Freq: Every day | ORAL | Status: DC

## 2014-08-23 MED ORDER — ALBUTEROL SULFATE HFA 108 (90 BASE) MCG/ACT IN AERS: 1.0000 | INHALATION_SPRAY | Freq: Four times a day (QID) | RESPIRATORY_TRACT | Status: DC | PRN

## 2014-08-23 NOTE — ED Notes (Signed)
Pt states "same old crap that's been going on since last time I was here", cough and congestions

## 2014-08-23 NOTE — ED Notes (Signed)
MD at bedside. 

## 2014-08-23 NOTE — ED Notes (Signed)
Patient transported to X-ray 

## 2014-08-23 NOTE — ED Provider Notes (Signed)
CSN: [id number]     Arrival date & time 08/23/14  1507 History   First MD Initiated Contact with Patient 08/23/14 1539     Chief Complaint  Patient presents with  . Cough     HPI Patient presents with wheezing and productive cough associated with some diarrhea over the last few days.  Patient is a smoker.  No documented fever.  Some nausea and vomiting.  Denies abdominal pain. Past Medical History  Diagnosis Date  . Hypertension   . Diabetes mellitus without complication   . Morbid obesity   . Depression   . Anxiety    Past Surgical History  Procedure Laterality Date  . Knee surgery     Family History  Problem Relation Age of Onset  . Diabetes Mother   . Hypertension Mother   . Hypertension Father    History  Substance Use Topics  . Smoking status: Current Every Day Smoker -- 0.50 packs/day    Types: Cigarettes  . Smokeless tobacco: Not on file  . Alcohol Use: No    Review of Systems  All other systems reviewed and are negative  Allergies  Lisinopril  Home Medications   Prior to Admission medications   Medication Sig Start Date End Date Taking? Authorizing Provider  albuterol (PROVENTIL HFA;VENTOLIN HFA) 108 (90 BASE) MCG/ACT inhaler Inhale 1-2 puffs into the lungs every 6 (six) hours as needed for wheezing or shortness of breath. 08/23/14   Nelva Nay, MD  azithromycin (ZITHROMAX) 250 MG tablet Take 1 tablet (250 mg total) by mouth daily. 08/23/14   Nelva Nay, MD  guaiFENesin-codeine 100-10 MG/5ML syrup Take 5 mLs by mouth every 4 (four) hours as needed for cough. 06/06/14   Kristen N Ward, DO  hydrochlorothiazide (HYDRODIURIL) 25 MG tablet Take 25 mg by mouth daily.    Historical Provider, MD  ibuprofen (ADVIL,MOTRIN) 400 MG tablet Take 2 tablets (800 mg total) by mouth every 8 (eight) hours as needed for mild pain. 06/09/14   Calvert Cantor, MD  metroNIDAZOLE (FLAGYL) 500 MG tablet Take 1 tablet (500 mg total) by mouth 3 (three) times daily. 06/09/14   Calvert Cantor, MD  Omeprazole (PRILOSEC PO) Take by mouth.    Historical Provider, MD  oseltamivir (TAMIFLU) 75 MG capsule Take 1 capsule (75 mg total) by mouth 2 (two) times daily. 06/09/14   Calvert Cantor, MD  oxyCODONE-acetaminophen (PERCOCET) 10-325 MG per tablet Take 1 tablet by mouth 4 (four) times daily.    Historical Provider, MD   BP 154/119 mmHg  Pulse 96  Temp(Src) 97.5 F (36.4 C) (Oral)  Resp 18  Ht  (1.905 m)  Wt 480 lb (217.727 kg)  BMI 60.00 kg/m2  SpO2 93% Physical Exam  Constitutional: He is oriented to person, place, and time. He appears well-developed and well-nourished. No distress.  HENT:  Head: Normocephalic and atraumatic.  Eyes: Pupils are equal, round, and reactive to light.  Neck: Normal range of motion.  Cardiovascular: Normal rate and intact distal pulses.   Pulmonary/Chest: Effort normal. No respiratory distress. He has wheezes.  Abdominal: Normal appearance. He exhibits no distension. There is no tenderness. There is no rebound and no guarding.  Musculoskeletal: Normal range of motion.  Neurological: He is alert and oriented to person, place, and time. No cranial nerve deficit.  Skin: Skin is warm and dry. No rash noted.  Psychiatric: He has a normal mood and affect. His behavior is normal.  Nursing note and vitals reviewed.  ED Course  Procedures (including critical care time) Medications  azithromycin (ZITHROMAX) tablet 500 mg (not administered)  sodium chloride 0.9 % bolus 1,000 mL (0 mLs Intravenous Stopped 08/23/14 1747)  ondansetron (ZOFRAN) injection 4 mg (4 mg Intravenous Given 08/23/14 1603)  albuterol (PROVENTIL) (2.5 MG/3ML) 0.083% nebulizer solution 5 mg (5 mg Nebulization Given 08/23/14 1746)  ipratropium (ATROVENT) nebulizer solution 0.5 mg (0.5 mg Nebulization Given 08/23/14 1746)    Labs Review Labs Reviewed  CBC WITH DIFFERENTIAL/PLATELET - Abnormal; Notable for the following:    WBC 11.1 (*)    All other components within normal limits   COMPREHENSIVE METABOLIC PANEL - Abnormal; Notable for the following:    Sodium 133 (*)    Potassium 3.1 (*)    Chloride 92 (*)    Glucose, Bld 308 (*)    Calcium 8.8 (*)    Albumin 3.4 (*)    All other components within normal limits  LIPASE, BLOOD - Abnormal; Notable for the following:    Lipase 10 (*)    All other components within normal limits  URINALYSIS, ROUTINE W REFLEX MICROSCOPIC (NOT AT [facility])   Corrected sodium = 136  Imaging Review Dg Chest 2 View  08/23/2014   CLINICAL DATA:  Cough and shortness of breath for 1 week  EXAM: CHEST  2 VIEW  COMPARISON:  June 07, 2014  FINDINGS: There is no edema or consolidation. Heart size and pulmonary vascularity within normal limits. No adenopathy. There is mild degenerative change in the thoracic spine. There is slight anterior wedging of lower thoracic vertebral bodies, stable.  IMPRESSION: No edema or consolidation.   Electronically Signed   By: Bretta BangWilliam  Woodruff III M.D.   On: 08/23/2014 15:35    After treatment in the ED the patient feels back to baseline and wants to go home.  MDM   Final diagnoses:  Bronchitis  Wheezing        Nelva Nayobert Shuntell Foody, MD 08/23/14 [phone]

## 2014-08-23 NOTE — ED Notes (Signed)
Family at bedside. 

## 2014-08-23 NOTE — Discharge Instructions (Signed)
How to Use an Inhaler Using your inhaler correctly is very important. Good technique will make sure that the medicine reaches your lungs.  HOW TO USE AN INHALER:  Take the cap off the inhaler.  If this is the first time using your inhaler, you need to prime it. Shake the inhaler for 5 seconds. Release four puffs into the air, away from your face. Ask your doctor for help if you have questions.  Shake the inhaler for 5 seconds.  Turn the inhaler so the bottle is above the mouthpiece.  Put your pointer finger on top of the bottle. Your thumb holds the bottom of the inhaler.  Open your mouth.  Either hold the inhaler away from your mouth (the width of 2 fingers) or place your lips tightly around the mouthpiece. Ask your doctor which way to use your inhaler.  Breathe out as much air as possible.  Breathe in and push down on the bottle 1 time to release the medicine. You will feel the medicine go in your mouth and throat.  Continue to take a deep breath in very slowly. Try to fill your lungs.  After you have breathed in completely, hold your breath for 10 seconds. This will help the medicine to settle in your lungs. If you cannot hold your breath for 10 seconds, hold it for as long as you can before you breathe out.  Breathe out slowly, through pursed lips. Whistling is an example of pursed lips.  If your doctor has told you to take more than 1 puff, wait at least 15-30 seconds between puffs. This will help you get the best results from your medicine. Do not use the inhaler more than your doctor tells you to.  Put the cap back on the inhaler.  Follow the directions from your doctor or from the inhaler package about cleaning the inhaler. If you use more than one inhaler, ask your doctor which inhalers to use and what order to use them in. Ask your doctor to help you figure out when you will need to refill your inhaler.  If you use a steroid inhaler, always rinse your mouth with water  after your last puff, gargle and spit out the water. Do not swallow the water. GET HELP IF:  The inhaler medicine only partially helps to stop wheezing or shortness of breath.  You are having trouble using your inhaler.  You have some increase in thick spit (phlegm). GET HELP RIGHT AWAY IF:  The inhaler medicine does not help your wheezing or shortness of breath or you have tightness in your chest.  You have dizziness, headaches, or fast heart rate.  You have chills, fever, or night sweats.  You have a large increase of thick spit, or your thick spit is bloody. MAKE SURE YOU:   Understand these instructions.  Will watch your condition.  Will get help right away if you are not doing well or get worse. Document Released: 12/18/2007 Document Revised: 12/29/2012 Document Reviewed: 10/07/2012 [facility] Patient Information 2015 [place], [place]. This information is not intended to replace advice given to you by your health care provider. Make sure you discuss any questions you have with your health care provider.  Upper Respiratory Infection, Adult An upper respiratory infection (URI) is also known as the common cold. It is often caused by a type of germ (virus). Colds are easily spread (contagious). You can pass it to others by kissing, coughing, sneezing, or drinking out of the same glass. Usually,  you get better in 1 or 2 weeks.  HOME CARE   Only take medicine as told by your doctor.  Use a warm mist humidifier or breathe in steam from a hot shower.  Drink enough water and fluids to keep your pee (urine) clear or pale yellow.  Get plenty of rest.  Return to work when your temperature is back to normal or as told by your doctor. You may use a face mask and wash your hands to stop your cold from spreading. GET HELP RIGHT AWAY IF:   After the first few days, you feel you are getting worse.  You have questions about your medicine.  You have chills, shortness of breath, or brown  or red spit (mucus).  You have yellow or brown snot (nasal discharge) or pain in the face, especially when you bend forward.  You have a fever, puffy (swollen) neck, pain when you swallow, or white spots in the back of your throat.  You have a bad headache, ear pain, sinus pain, or chest pain.  You have a high-pitched whistling sound when you breathe in and out (wheezing).  You have a lasting cough or cough up blood.  You have sore muscles or a stiff neck. MAKE SURE YOU:   Understand these instructions.  Will watch your condition.  Will get help right away if you are not doing well or get worse. Document Released: 08/27/2007 Document Revised: 06/02/2011 Document Reviewed: 06/15/2013 [facility] Patient Information 2015 [place]. This information is not intended to replace advice given to you by your health care provider. Make sure you discuss any questions you have with your health care provider.

## 2014-12-03 ENCOUNTER — Emergency Department (HOSPITAL_BASED_OUTPATIENT_CLINIC_OR_DEPARTMENT_OTHER)
Admission: EM | Admit: 2014-12-03 | Discharge: 2014-12-03 | Disposition: A | Discharge status: Home / Self Care | Payer: Medicaid Other | Attending: Emergency Medicine | Admitting: Emergency Medicine

## 2014-12-03 ENCOUNTER — Emergency Department (HOSPITAL_BASED_OUTPATIENT_CLINIC_OR_DEPARTMENT_OTHER): Payer: Medicaid Other

## 2014-12-03 ENCOUNTER — Encounter (HOSPITAL_BASED_OUTPATIENT_CLINIC_OR_DEPARTMENT_OTHER): Payer: Self-pay

## 2014-12-03 DIAGNOSIS — Z8659 Personal history of other mental and behavioral disorders: Secondary | ICD-10-CM | POA: Diagnosis not present

## 2014-12-03 DIAGNOSIS — Y9289 Other specified places as the place of occurrence of the external cause: Secondary | ICD-10-CM | POA: Insufficient documentation

## 2014-12-03 DIAGNOSIS — W010XXA Fall on same level from slipping, tripping and stumbling without subsequent striking against object, initial encounter: Secondary | ICD-10-CM | POA: Insufficient documentation

## 2014-12-03 DIAGNOSIS — Z720 Tobacco use: Secondary | ICD-10-CM | POA: Insufficient documentation

## 2014-12-03 DIAGNOSIS — Z23 Encounter for immunization: Secondary | ICD-10-CM | POA: Diagnosis not present

## 2014-12-03 DIAGNOSIS — I10 Essential (primary) hypertension: Secondary | ICD-10-CM | POA: Diagnosis not present

## 2014-12-03 DIAGNOSIS — E119 Type 2 diabetes mellitus without complications: Secondary | ICD-10-CM | POA: Insufficient documentation

## 2014-12-03 DIAGNOSIS — Y92009 Unspecified place in unspecified non-institutional (private) residence as the place of occurrence of the external cause: Secondary | ICD-10-CM

## 2014-12-03 DIAGNOSIS — Y998 Other external cause status: Secondary | ICD-10-CM | POA: Insufficient documentation

## 2014-12-03 DIAGNOSIS — W19XXXA Unspecified fall, initial encounter: Secondary | ICD-10-CM

## 2014-12-03 DIAGNOSIS — Y9389 Activity, other specified: Secondary | ICD-10-CM | POA: Insufficient documentation

## 2014-12-03 DIAGNOSIS — S8392XA Sprain of unspecified site of left knee, initial encounter: Secondary | ICD-10-CM | POA: Insufficient documentation

## 2014-12-03 DIAGNOSIS — S8391XA Sprain of unspecified site of right knee, initial encounter: Secondary | ICD-10-CM

## 2014-12-03 DIAGNOSIS — S80212A Abrasion, left knee, initial encounter: Secondary | ICD-10-CM | POA: Insufficient documentation

## 2014-12-03 DIAGNOSIS — T148XXA Other injury of unspecified body region, initial encounter: Secondary | ICD-10-CM

## 2014-12-03 DIAGNOSIS — Z79899 Other long term (current) drug therapy: Secondary | ICD-10-CM | POA: Diagnosis not present

## 2014-12-03 DIAGNOSIS — S8992XA Unspecified injury of left lower leg, initial encounter: Secondary | ICD-10-CM | POA: Diagnosis present

## 2014-12-03 DIAGNOSIS — Z792 Long term (current) use of antibiotics: Secondary | ICD-10-CM | POA: Insufficient documentation

## 2014-12-03 HISTORY — DX: Opioid dependence, uncomplicated: F11.20

## 2014-12-03 MED ORDER — CEPHALEXIN 500 MG PO CAPS: 500.0000 mg | ORAL_CAPSULE | Freq: Four times a day (QID) | ORAL | Status: DC

## 2014-12-03 MED ORDER — CEPHALEXIN 250 MG PO CAPS
500.0000 mg | ORAL_CAPSULE | Freq: Once | ORAL | Status: AC
  Administered 2014-12-03: 500 mg via ORAL
  Filled 2014-12-03: qty 2

## 2014-12-03 MED ORDER — TETANUS-DIPHTH-ACELL PERTUSSIS 5-2.5-18.5 LF-MCG/0.5 IM SUSP
0.5000 mL | Freq: Once | INTRAMUSCULAR | Status: AC
  Administered 2014-12-03: 0.5 mL via INTRAMUSCULAR
  Filled 2014-12-03: qty 0.5

## 2014-12-03 MED ORDER — ACETAMINOPHEN 500 MG PO TABS
1000.0000 mg | ORAL_TABLET | Freq: Once | ORAL | Status: AC
  Administered 2014-12-03: 1000 mg via ORAL
  Filled 2014-12-03: qty 2

## 2014-12-03 MED ORDER — BACITRACIN ZINC 500 UNIT/GM EX OINT
TOPICAL_OINTMENT | Freq: Once | CUTANEOUS | Status: AC
  Administered 2014-12-03: 1 via TOPICAL

## 2014-12-03 MED ORDER — METHOCARBAMOL 1000 MG/10ML IJ SOLN: 1000.0000 mg | Freq: Once | INTRAMUSCULAR | Status: DC

## 2014-12-03 MED ORDER — METHOCARBAMOL 500 MG PO TABS
1000.0000 mg | ORAL_TABLET | Freq: Four times a day (QID) | ORAL | Status: DC | PRN
Start: 2014-12-03 — End: 2016-07-22

## 2014-12-03 MED ORDER — METHOCARBAMOL 500 MG PO TABS
1000.0000 mg | ORAL_TABLET | Freq: Once | ORAL | Status: AC
  Administered 2014-12-03: 1000 mg via ORAL
  Filled 2014-12-03: qty 2

## 2014-12-03 NOTE — ED Notes (Signed)
Pt reports slipped on concrete and landed on left knee.  Pt has large abrasion from concrete noted to left knee

## 2014-12-03 NOTE — ED Provider Notes (Signed)
CSN: [id number]     Arrival date & time 12/03/14  1146 History   First MD Initiated Contact with Patient 12/03/14 1200     Chief Complaint  Patient presents with  . Knee Pain     (Consider location/radiation/quality/duration/timing/severity/associated sxs/prior Treatment) HPI  Blood pressure 138/87, pulse 119, temperature 97.6 F (36.4 C), temperature source Oral, resp. rate 20, height 6\' 2"  (1.88 m), weight 465 lb (210.923 kg), SpO2 98 %.  Brian Day is a 34 y.o. male with past medical history significant for non-insulin-dependent diabetes, hypertension, morbid obesity and opiate addiction (remote) complaining of significant left knee pain status post mechanical slip and fall 1.5 hours ago. Patient denies any head trauma, cervicalgia or trauma to any other joints. He has a large abrasion over the affected knee. Is been nonambulatory since the event secondary to pain. No pain medication taken prior to arrival. Patient denies weakness, numbness, states pain is exacerbated by movement and palpation.   Past Medical History  Diagnosis Date  . Hypertension   . Diabetes mellitus without complication   . Morbid obesity   . Depression   . Anxiety   . Addiction, opium    Past Surgical History  Procedure Laterality Date  . Knee surgery     Family History  Problem Relation Age of Onset  . Diabetes Mother   . Hypertension Mother   . Hypertension Father    Social History  Substance Use Topics  . Smoking status: Current Every Day Smoker -- 0.50 packs/day    Types: Cigarettes  . Smokeless tobacco: None  . Alcohol Use: No    Review of Systems  10 systems reviewed and found to be negative, except as noted in the HPI.   Allergies  Lisinopril  Home Medications   Prior to Admission medications   Medication Sig Start Date End Date Taking? Authorizing Provider  metFORMIN (GLUCOPHAGE) 1000 MG tablet Take 1,000 mg by mouth 2 (two) times daily with a meal.   Yes Historical  Provider, MD  olmesartan (BENICAR) 20 MG tablet Take 20 mg by mouth daily.   Yes Historical Provider, MD  albuterol (PROVENTIL HFA;VENTOLIN HFA) 108 (90 BASE) MCG/ACT inhaler Inhale 1-2 puffs into the lungs every 6 (six) hours as needed for wheezing or shortness of breath. 08/23/14   Nelva Nay, MD  azithromycin (ZITHROMAX) 250 MG tablet Take 1 tablet (250 mg total) by mouth daily. 08/23/14   Nelva Nay, MD  guaiFENesin-codeine 100-10 MG/5ML syrup Take 5 mLs by mouth every 4 (four) hours as needed for cough. 06/06/14   Kristen N Ward, DO  hydrochlorothiazide (HYDRODIURIL) 25 MG tablet Take 25 mg by mouth daily.    Historical Provider, MD  ibuprofen (ADVIL,MOTRIN) 400 MG tablet Take 2 tablets (800 mg total) by mouth every 8 (eight) hours as needed for mild pain. 06/09/14   Calvert Cantor, MD  metroNIDAZOLE (FLAGYL) 500 MG tablet Take 1 tablet (500 mg total) by mouth 3 (three) times daily. 06/09/14   Calvert Cantor, MD  Omeprazole (PRILOSEC PO) Take by mouth.    Historical Provider, MD  oseltamivir (TAMIFLU) 75 MG capsule Take 1 capsule (75 mg total) by mouth 2 (two) times daily. 06/09/14   Calvert Cantor, MD  oxyCODONE-acetaminophen (PERCOCET) 10-325 MG per tablet Take 1 tablet by mouth 4 (four) times daily.    Historical Provider, MD   BP 138/87 mmHg  Pulse 119  Temp(Src) 97.6 F (36.4 C) (Oral)  Resp 20  Ht 6\' 2"  (1.88 m)  Wt  465 lb (210.923 kg)  BMI 59.68 kg/m2  SpO2 98% Physical Exam  Constitutional: He is oriented to person, place, and time. He appears well-developed and well-nourished. No distress.  HENT:  Head: Normocephalic.  Mouth/Throat: Oropharynx is clear and moist.  Eyes: Conjunctivae and EOM are normal. Pupils are equal, round, and reactive to light.  Cardiovascular: Normal rate, regular rhythm and intact distal pulses.   Pulmonary/Chest: Effort normal and breath sounds normal. No stridor. No respiratory distress. He has no wheezes. He has no rales. He exhibits no tenderness.   Abdominal: Soft. Bowel sounds are normal. He exhibits no distension and no mass. There is no tenderness. There is no rebound and no guarding.  Musculoskeletal: He exhibits tenderness.  Left knee with 4 cm partial-thickness abrasion with no gross contamination, there is no deformity. Stable to anterior and posterior drawer, no abnormal ligamentous laxity. DP and PT pulses are 2+. Patient is distally neurovascularly intact with sensation intact to pinprick and light touch.  Upper extremity BP: L 116/73 R 126/94  LLE Systolic by doppler: DP: PT:  Neurological: He is alert and oriented to person, place, and time.  Psychiatric: He has a normal mood and affect.  Nursing note and vitals reviewed.   ED Course  Procedures (including critical care time) Labs Review Labs Reviewed - No data to display  Imaging Review Dg Knee Complete 4 Views Left  12/03/2014   CLINICAL DATA:  Knee pain  EXAM: LEFT KNEE - COMPLETE 4+ VIEW  COMPARISON:  07/20/2013  FINDINGS: There is no evidence of fracture, dislocation, or joint effusion. There is no evidence of arthropathy or other focal bone abnormality. Soft tissues are unremarkable. Minimal tibial spine spurring.  IMPRESSION: Negative.   Electronically Signed   By: Christiana Pellant M.D.   On: 12/03/2014 12:22   I have personally reviewed and evaluated these images and lab results as part of my medical decision-making.   EKG Interpretation None      MDM   Final diagnoses:  Right knee sprain, initial encounter  Abrasion  Fall at home, initial encounter    Filed Vitals:   12/03/14 1149 12/03/14 1332  BP: 138/87 112/66  Pulse: 119 96  Temp: 97.6 F (36.4 C)   TempSrc: Oral   Resp: 20 20  Height: 6\' 2"  (1.88 m)   Weight: 465 lb (210.923 kg)   SpO2: 98% 98%    Medications  acetaminophen (TYLENOL) tablet 1,000 mg (1,000 mg Oral Given 12/03/14 1254)  Tdap (BOOSTRIX) injection 0.5 mL (0.5 mLs Intramuscular Given 12/03/14 1257)   methocarbamol (ROBAXIN) tablet 1,000 mg (1,000 mg Oral Given 12/03/14 1254)  bacitracin ointment (1 application Topical Given 12/03/14 1319)  cephALEXin (KEFLEX) capsule 500 mg (500 mg Oral Given 12/03/14 1318)    Brian Day is a pleasant 34 y.o. male presenting with left knee pain and abrasion status post slip and fall. No other injuries. X-ray negative, patient is neurovascular intact distally, given this patient's obesity and concern for arterial injury, no hard signs of vascular damage. ABI calculated at 1.1. Patient given knee immobilizer and instructed on wound care. Patient has crutches at home. Considering patient's diabetes will start on prophylactic Keflex. Advised patient not to DC the knee immobilizer until he is cleared by his orthopedist.  This is a shared visit with the attending physician who personally evaluated the patient and agrees with the care plan.   Evaluation does not show pathology that would require ongoing emergent intervention or inpatient treatment.  Pt is hemodynamically stable and mentating appropriately. Discussed findings and plan with patient/guardian, who agrees with care plan. All questions answered. Return precautions discussed and outpatient follow up given.   New Prescriptions   CEPHALEXIN (KEFLEX) 500 MG CAPSULE    Take 1 capsule (500 mg total) by mouth 4 (four) times daily.   METHOCARBAMOL (ROBAXIN) 500 MG TABLET    Take 2 tablets (1,000 mg total) by mouth 4 (four) times daily as needed (Pain).         Wynetta Emery, PA-C 12/03/14 1337  Azalia Bilis, MD 12/03/14 1350

## 2014-12-03 NOTE — ED Notes (Signed)
Wound cleansed, bacitracin applied and dressed with gauze and kerlix. Danna Hefty, RN

## 2014-12-03 NOTE — Discharge Instructions (Signed)
For pain control you may take up to  of Motrin (also known as ibuprofen). That is usually 4 over the counter pills,  3 times a day. Take with food to minimize stomach irritation   You can also take  tylenol (acetaminophen)  (this is 3 over the counter pills) four times a day. Do not drink alcohol or combine with other medications that have acetaminophen as an ingredient (Read the labels!).    For breakthrough pain you may take Robaxin. Do not drink alcohol, drive or operate heavy machinery when taking Robaxin.  Keep the knee immobilizer on at all times including when sleeping except when bathing. Do not stop using the knee immobilizer until you are cleared by your orthopedist.  Please follow with your primary care doctor in the next 2 days for a check-up. They must obtain records for further management.   Do not hesitate to return to the Emergency Department for any new, worsening or concerning symptoms.

## 2015-12-20 ENCOUNTER — Ambulatory Visit
Admission: RE | Admit: 2015-12-20 | Discharge: 2015-12-20 | Disposition: A | Discharge status: Home / Self Care | Payer: Medicaid Other | Source: Ambulatory Visit | Attending: Specialist | Admitting: Specialist

## 2015-12-20 ENCOUNTER — Other Ambulatory Visit: Payer: Self-pay | Admitting: Specialist

## 2015-12-20 DIAGNOSIS — M1990 Unspecified osteoarthritis, unspecified site: Secondary | ICD-10-CM

## 2016-07-07 ENCOUNTER — Encounter (HOSPITAL_COMMUNITY): Payer: Self-pay | Admitting: Emergency Medicine

## 2016-07-07 DIAGNOSIS — G8929 Other chronic pain: Secondary | ICD-10-CM | POA: Insufficient documentation

## 2016-07-07 DIAGNOSIS — E1165 Type 2 diabetes mellitus with hyperglycemia: Secondary | ICD-10-CM | POA: Insufficient documentation

## 2016-07-07 DIAGNOSIS — Z7984 Long term (current) use of oral hypoglycemic drugs: Secondary | ICD-10-CM | POA: Diagnosis not present

## 2016-07-07 DIAGNOSIS — M79671 Pain in right foot: Secondary | ICD-10-CM | POA: Diagnosis present

## 2016-07-07 DIAGNOSIS — E876 Hypokalemia: Secondary | ICD-10-CM | POA: Insufficient documentation

## 2016-07-07 DIAGNOSIS — R600 Localized edema: Secondary | ICD-10-CM | POA: Insufficient documentation

## 2016-07-07 DIAGNOSIS — I10 Essential (primary) hypertension: Secondary | ICD-10-CM | POA: Diagnosis not present

## 2016-07-07 DIAGNOSIS — F17210 Nicotine dependence, cigarettes, uncomplicated: Secondary | ICD-10-CM | POA: Diagnosis not present

## 2016-07-07 DIAGNOSIS — Z79899 Other long term (current) drug therapy: Secondary | ICD-10-CM | POA: Insufficient documentation

## 2016-07-07 LAB — CBG MONITORING, ED: Glucose-Capillary: 396 mg/dL — ABNORMAL HIGH (ref 65–99)

## 2016-07-07 NOTE — ED Triage Notes (Addendum)
Pt reports having swelling and pain to right lower leg predominately in ankle to knee. Pt denies any recent injury to area but states increasing pain when standing.

## 2016-07-08 ENCOUNTER — Emergency Department (HOSPITAL_COMMUNITY): Payer: Medicaid Other

## 2016-07-08 ENCOUNTER — Emergency Department (HOSPITAL_COMMUNITY)
Admission: EM | Admit: 2016-07-08 | Discharge: 2016-07-08 | Disposition: A | Discharge status: Home / Self Care | Payer: Medicaid Other | Attending: Emergency Medicine | Admitting: Emergency Medicine

## 2016-07-08 ENCOUNTER — Ambulatory Visit (HOSPITAL_BASED_OUTPATIENT_CLINIC_OR_DEPARTMENT_OTHER)
Admission: RE | Admit: 2016-07-08 | Discharge: 2016-07-08 | Disposition: A | Discharge status: Home / Self Care | Payer: Medicaid Other | Source: Ambulatory Visit | Attending: Student | Admitting: Student

## 2016-07-08 DIAGNOSIS — E876 Hypokalemia: Secondary | ICD-10-CM

## 2016-07-08 DIAGNOSIS — M79604 Pain in right leg: Secondary | ICD-10-CM

## 2016-07-08 DIAGNOSIS — G8929 Other chronic pain: Secondary | ICD-10-CM

## 2016-07-08 DIAGNOSIS — R600 Localized edema: Secondary | ICD-10-CM

## 2016-07-08 DIAGNOSIS — R609 Edema, unspecified: Secondary | ICD-10-CM

## 2016-07-08 DIAGNOSIS — R739 Hyperglycemia, unspecified: Secondary | ICD-10-CM

## 2016-07-08 LAB — BASIC METABOLIC PANEL
ANION GAP: 11 (ref 5–15)
BUN: 13 mg/dL (ref 6–20)
CALCIUM: 8.3 mg/dL — AB (ref 8.9–10.3)
CO2: 27 mmol/L (ref 22–32)
Chloride: 90 mmol/L — ABNORMAL LOW (ref 101–111)
Creatinine, Ser: 0.77 mg/dL (ref 0.61–1.24)
GFR calc non Af Amer: >60 mL/min (ref >60)
Glucose, Bld: 317 mg/dL — ABNORMAL HIGH (ref 65–99)
Potassium: 3.1 mmol/L — ABNORMAL LOW (ref 3.5–5.1)
Sodium: 128 mmol/L — ABNORMAL LOW (ref 135–145)

## 2016-07-08 LAB — CBC WITH DIFFERENTIAL/PLATELET
BASOS ABS: 0 10*3/uL (ref 0.0–0.1)
BASOS PCT: 0 %
Eosinophils Absolute: 0 10*3/uL (ref 0.0–0.7)
Eosinophils Relative: 0 %
HEMATOCRIT: 39.5 % (ref 39.0–52.0)
HEMOGLOBIN: 14.1 g/dL (ref 13.0–17.0)
Lymphocytes Relative: 18 %
Lymphs Abs: 2 10*3/uL (ref 0.7–4.0)
MCH: 32 pg (ref 26.0–34.0)
MCHC: 35.7 g/dL (ref 30.0–36.0)
MCV: 89.8 fL (ref 78.0–100.0)
MONOS PCT: 18 %
Monocytes Absolute: 2 10*3/uL — ABNORMAL HIGH (ref 0.1–1.0)
NEUTROS ABS: 7.2 10*3/uL (ref 1.7–7.7)
NEUTROS PCT: 64 %
Platelets: 125 10*3/uL — ABNORMAL LOW (ref 150–400)
RBC: 4.4 MIL/uL (ref 4.22–5.81)
RDW: 12.7 % (ref 11.5–15.5)
WBC: 11.3 10*3/uL — AB (ref 4.0–10.5)

## 2016-07-08 LAB — I-STAT CHEM 8, ED
BUN: 13 mg/dL (ref 6–20)
CALCIUM ION: 1.02 mmol/L — AB (ref 1.15–1.40)
Chloride: 86 mmol/L — ABNORMAL LOW (ref 101–111)
Creatinine, Ser: 0.8 mg/dL (ref 0.61–1.24)
GLUCOSE: 316 mg/dL — AB (ref 65–99)
HCT: 42 % (ref 39.0–52.0)
Hemoglobin: 14.3 g/dL (ref 13.0–17.0)
Potassium: 3.2 mmol/L — ABNORMAL LOW (ref 3.5–5.1)
SODIUM: 128 mmol/L — AB (ref 135–145)
TCO2: 31 mmol/L (ref 0–100)

## 2016-07-08 LAB — MAGNESIUM: MAGNESIUM: 1.3 mg/dL — AB (ref 1.7–2.4)

## 2016-07-08 MED ORDER — INSULIN ASPART 100 UNIT/ML ~~LOC~~ SOLN
2.0000 [IU] | Freq: Once | SUBCUTANEOUS | Status: AC
  Administered 2016-07-08: 2 [IU] via SUBCUTANEOUS
  Filled 2016-07-08: qty 1

## 2016-07-08 MED ORDER — DIPHENHYDRAMINE HCL 50 MG/ML IJ SOLN
25.0000 mg | Freq: Once | INTRAMUSCULAR | Status: AC
  Administered 2016-07-08: 25 mg via INTRAVENOUS
  Filled 2016-07-08: qty 1

## 2016-07-08 MED ORDER — POTASSIUM CHLORIDE CRYS ER 20 MEQ PO TBCR
40.0000 meq | EXTENDED_RELEASE_TABLET | Freq: Once | ORAL | Status: AC
  Administered 2016-07-08: 40 meq via ORAL
  Filled 2016-07-08: qty 2

## 2016-07-08 MED ORDER — MAGNESIUM SULFATE 2 GM/50ML IV SOLN
2.0000 g | Freq: Once | INTRAVENOUS | Status: AC
  Administered 2016-07-08: 2 g via INTRAVENOUS
  Filled 2016-07-08: qty 50

## 2016-07-08 MED ORDER — POTASSIUM CHLORIDE CRYS ER 20 MEQ PO TBCR: 20.0000 meq | EXTENDED_RELEASE_TABLET | Freq: Two times a day (BID) | ORAL | 0 refills | Status: DC

## 2016-07-08 MED ORDER — SODIUM CHLORIDE 0.9 % IV BOLUS (SEPSIS)
1000.0000 mL | Freq: Once | INTRAVENOUS | Status: AC
  Administered 2016-07-08: 1000 mL via INTRAVENOUS

## 2016-07-08 MED ORDER — PROCHLORPERAZINE EDISYLATE 5 MG/ML IJ SOLN
10.0000 mg | Freq: Once | INTRAMUSCULAR | Status: AC
  Administered 2016-07-08: 10 mg via INTRAVENOUS
  Filled 2016-07-08: qty 2

## 2016-07-08 MED ORDER — KETOROLAC TROMETHAMINE 15 MG/ML IJ SOLN
15.0000 mg | Freq: Once | INTRAMUSCULAR | Status: AC
  Administered 2016-07-08: 15 mg via INTRAVENOUS
  Filled 2016-07-08: qty 1

## 2016-07-08 MED ORDER — CEPHALEXIN 500 MG PO CAPS: 500.0000 mg | ORAL_CAPSULE | Freq: Four times a day (QID) | ORAL | 0 refills | Status: DC

## 2016-07-08 NOTE — ED Notes (Signed)
Bed: WA07 Expected date:  Expected time:  Means of arrival:  Comments: 

## 2016-07-08 NOTE — Discharge Instructions (Signed)
Potassium was slightly low. Please take the oral potassium for the next three days and have your primary doctor recheck her potassium. Take the Keflex for cellulitis. [facility] with [facility]. Please go to your outpatient ultrasound today. Your sugar was elevated. Make sure you follow-up with her primary care doctor to have your blood sugar rechecked and make sure you to the medication. Return to the ED if he develops any worsening symptoms including chest pain or shortness of breath.    IMPORTANT PATIENT INSTRUCTIONS:   You have been scheduled for an Outpatient Vascular Study at [facility].    If tomorrow is a Saturday or Sunday, please go to the [facility] Emergency Department registration desk at 8 AM tomorrow morning and tell them you are therefore a vascular study.  If tomorrow is a weekday (Monday - Friday), please go to the [facility] Admitting Department at 8 AM and tell them you are therefore a vascular study

## 2016-07-08 NOTE — ED Provider Notes (Signed)
[facility]-EMERGENCY DEPT Provider Note   CSN: [id number] Arrival date & time: 07/07/16  1922  By signing my name below, I, Diona Browner, attest that this documentation has been prepared under the direction and in the presence of Demetrios Loll, PA-C. Electronically Signed: Diona Browner, ED Scribe. 07/08/16. 2:13 AM.   History   Chief Complaint Chief Complaint  Patient presents with  . Recurrent Skin Infections    HPI Brian Day is a 36 y.o. male pmh sig for dm not on medication, htn, chronic pain, opoid abuse and dependence who presents to the Emergency Department complaining of intermittent gradually worsening right foot pain that started on 07/06/16. Pt reports "breaking bones" in his ankle back in 2011-2012 that didn't heal correctly. He is unable to bare weight on his right foot. Patient has history of edema to the right ankle. Complains of worsening edema. Denies any erythema or warmth. Patient saw his primary care doctor today who was worried about possible blood clots and some to the ED for evaluation. Patient also notes a wound to the lateral right foot that has worsened. Patient also complains of migraine headache. History of migraine headaches. States that 3 days ago he developed a gradual onset unilateral headache that gradually worsened. Patient is a recovering narcotic addict who was supposed to be starting his second inpatient treatment this morning. States that he has been off narcotic pain medicine for the past 3 months. Currently on Suboxone and gabapentin. Pain worsened when he stopped taking the narcotic pain medicine. Patient has not taken anything for the pain prior to arrival. He endorses associated nausea, photophobia. Patient denies any fever, chills, blurry vision, double vision, lightheadedness, dizziness, chest pain, shortness of breath, abdominal pain, emesis, urinary symptoms, paresthesias.  The history is provided by the patient and a parent. No language  interpreter was used.    Past Medical History:  Diagnosis Date  . Addiction, opium (HCC)   . Anxiety   . Depression   . Diabetes mellitus without complication (HCC)   . Hypertension   . Morbid obesity [facility])     Patient Active Problem List   Diagnosis Date Noted  . Influenza with pneumonia 06/08/2014  . C. difficile colitis 06/08/2014  . CAP (community acquired pneumonia) 06/07/2014  . Hypokalemia 11/23/2012  . Diabetes type 2 - Diet controlled 11/23/2012  . Morbid obesity (HCC) 11/23/2012  . Low back pain 11/23/2012  . Fungal infection of skin of abdomen 11/23/2012    Past Surgical History:  Procedure Laterality Date  . KNEE SURGERY         Home Medications    Prior to Admission medications   Medication Sig Start Date End Date Taking? Authorizing Provider  albuterol (PROVENTIL HFA;VENTOLIN HFA) 108 (90 BASE) MCG/ACT inhaler Inhale 1-2 puffs into the lungs every 6 (six) hours as needed for wheezing or shortness of breath. 08/23/14   Nelva Nay, MD  azithromycin (ZITHROMAX) 250 MG tablet Take 1 tablet (250 mg total) by mouth daily. 08/23/14   Nelva Nay, MD  cephALEXin (KEFLEX) 500 MG capsule Take 1 capsule (500 mg total) by mouth 4 (four) times daily. 12/03/14   Nicole Pisciotta, PA-C  guaiFENesin-codeine 100-10 MG/5ML syrup Take 5 mLs by mouth every 4 (four) hours as needed for cough. 06/06/14   Kristen N Ward, DO  hydrochlorothiazide (HYDRODIURIL) 25 MG tablet Take 25 mg by mouth daily.    Historical Provider, MD  ibuprofen (ADVIL,MOTRIN) 400 MG tablet Take 2 tablets (800 mg total) by mouth every  8 (eight) hours as needed for mild pain. 06/09/14   Calvert Cantor, MD  metFORMIN (GLUCOPHAGE) 1000 MG tablet Take 1,000 mg by mouth 2 (two) times daily with a meal.    Historical Provider, MD  methocarbamol (ROBAXIN) 500 MG tablet Take 2 tablets (1,000 mg total) by mouth 4 (four) times daily as needed (Pain). 12/03/14   Nicole Pisciotta, PA-C  metroNIDAZOLE (FLAGYL) 500 MG tablet  Take 1 tablet (500 mg total) by mouth 3 (three) times daily. 06/09/14   Calvert Cantor, MD  olmesartan (BENICAR) 20 MG tablet Take 20 mg by mouth daily.    Historical Provider, MD  Omeprazole (PRILOSEC PO) Take by mouth.    Historical Provider, MD  oseltamivir (TAMIFLU) 75 MG capsule Take 1 capsule (75 mg total) by mouth 2 (two) times daily. 06/09/14   Calvert Cantor, MD  oxyCODONE-acetaminophen (PERCOCET) 10-325 MG per tablet Take 1 tablet by mouth 4 (four) times daily.    Historical Provider, MD    Family History Family History  Problem Relation Age of Onset  . Diabetes Mother   . Hypertension Mother   . Hypertension Father     Social History Social History  Substance Use Topics  . Smoking status: Current Every Day Smoker    Packs/day: 0.50    Types: Cigarettes  . Smokeless tobacco: Never Used  . Alcohol use No     Allergies   Lisinopril   Review of Systems Review of Systems  Constitutional: Negative for chills and fever.  HENT: Negative for congestion.   Eyes: Positive for photophobia. Negative for visual disturbance.  Respiratory: Negative for cough and shortness of breath.   Cardiovascular: Positive for leg swelling. Negative for chest pain and palpitations.  Gastrointestinal: Positive for nausea. Negative for abdominal pain, diarrhea and vomiting.  Genitourinary: Negative for dysuria, flank pain, frequency, hematuria and urgency.  Musculoskeletal: Positive for joint swelling. Negative for neck pain and neck stiffness.  Skin: Positive for color change.  Neurological: Positive for headaches. Negative for dizziness, syncope, weakness, light-headedness and numbness.     Physical Exam Updated Vital Signs Blood pressure (!) 107/57, pulse 77, temperature 98.6 F (37 C), temperature source Oral, resp. rate 18, height  (1.905 m), weight (!) 206.8 kg, SpO2 96 %.   Physical Exam  Constitutional: He is oriented to person, place, and time. He appears well-developed and  well-nourished. No distress.  Nontoxic appearing. Patient resting in the bed covering his eyes with the lights off.  HENT:  Head: Normocephalic and atraumatic.  No temporal tenderness.  Eyes: Conjunctivae and EOM are normal. Pupils are equal, round, and reactive to light.  Neck: Normal range of motion. Neck supple.  No nuchal rigidity.  Cardiovascular: Normal rate, regular rhythm, normal heart sounds and intact distal pulses.   Pulmonary/Chest: Effort normal and breath sounds normal. No respiratory distress. He has no wheezes. He has no rales. He exhibits no tenderness.  Abdominal: Soft. He exhibits no distension. There is no tenderness. There is no rebound and no guarding.  Obese  Musculoskeletal: Normal range of motion.  Edema to the right lower extremity extending from the ankle to the mid shin. Mild tenderness to palpation. Minimal erythema or warmth noted. DP pulses are 2+ bilaterally. Sensation intact to sharp dull. Cap refill normal. Strength 5 out of 5. Patient with tenderness to palpation of the entire lower right extremity.  Neurological: He is alert and oriented to person, place, and time.  The patient is alert, attentive, and oriented x  3. Speech is clear. Cranial nerve II-VII grossly intact. Negative pronator drift. Sensation intact. Strength 5/5 in all extremities. Reflexes 2+ and symmetric at biceps, triceps, knees, and ankles. Rapid alternating movement and fine finger movements intact. It did not assess Romberg or gait due to patient having pain to his right ankle.  Skin: Skin is warm and dry. Capillary refill takes less than 2 seconds. No pallor.  Generalized xerosis.  Ulcerative lesions that seem to be healing on the lateral right foot without signs of infection. No purulent drainage.  Psychiatric: He has a normal mood and affect. His behavior is normal.  Nursing note and vitals reviewed.    ED Treatments / Results  DIAGNOSTIC STUDIES: Oxygen Saturation is 95% on RA,  adequate by my interpretation.   COORDINATION OF CARE: 2:12 AM-Discussed next steps with pt. Pt verbalized understanding and is agreeable with the plan.    Labs (all labs ordered are listed, but only abnormal results are displayed) Labs Reviewed  BASIC METABOLIC PANEL - Abnormal; Notable for the following:       Result Value   Sodium 128 (*)    Potassium 3.1 (*)    Chloride 90 (*)    Glucose, Bld 317 (*)    Calcium 8.3 (*)    All other components within normal limits  CBC WITH DIFFERENTIAL/PLATELET - Abnormal; Notable for the following:    WBC 11.3 (*)    Platelets 125 (*)    Monocytes Absolute 2.0 (*)    All other components within normal limits  MAGNESIUM - Abnormal; Notable for the following:    Magnesium 1.3 (*)    All other components within normal limits  CBG MONITORING, ED - Abnormal; Notable for the following:    Glucose-Capillary 396 (*)    All other components within normal limits  I-STAT CHEM 8, ED - Abnormal; Notable for the following:    Sodium 128 (*)    Potassium 3.2 (*)    Chloride 86 (*)    Glucose, Bld 316 (*)    Calcium, Ion 1.02 (*)    All other components within normal limits    EKG  EKG Interpretation None       Radiology Dg Ankle Complete Right  Result Date: 07/08/2016 CLINICAL DATA:  Pain in the right foot and ankle red and swollen EXAM: RIGHT ANKLE - COMPLETE 3+ VIEW COMPARISON:  12/20/2015 FINDINGS: Old fracture or ossicle adjacent to the dorsal navicular. Moderate plantar calcaneal spur. Chronic deformity of the fourth metatarsal. No definite acute fracture or malalignment. The ankle mortise is grossly symmetric. IMPRESSION: 1. No definite acute osseous abnormality 2. Old deformity of the fourth metatarsal Electronically Signed   By: Jasmine Pang M.D.   On: 07/08/2016 03:23   Dg Foot Complete Right  Result Date: 07/08/2016 CLINICAL DATA:  Pain with redness and swelling EXAM: RIGHT FOOT COMPLETE - 3+ VIEW COMPARISON:  12/20/2015  FINDINGS: Chronic deformity of the fourth metatarsal. No acute displaced fracture or malalignment. Moderate plantar calcaneal spur. Irregularity at the dorsal navicular, chronic. IMPRESSION: 1. No definite acute osseous abnormality 2. Chronic deformity of the fourth metatarsal Electronically Signed   By: Jasmine Pang M.D.   On: 07/08/2016 03:24    Procedures Procedures (including critical care time)  Medications Ordered in ED Medications  sodium chloride 0.9 % bolus 1,000 mL (0 mLs Intravenous Stopped 07/08/16 0414)  ketorolac (TORADOL) 15 MG/ML injection 15 mg (15 mg Intravenous Given 07/08/16 0335)  prochlorperazine (COMPAZINE) injection 10 mg (10  mg Intravenous Given 07/08/16 0335)  diphenhydrAMINE (BENADRYL) injection 25 mg (25 mg Intravenous Given 07/08/16 0335)  potassium chloride SA (K-DUR,KLOR-CON) CR tablet 40 mEq (40 mEq Oral Given 07/08/16 0336)  insulin aspart (novoLOG) injection 2 Units (2 Units Subcutaneous Given 07/08/16 0433)  magnesium sulfate IVPB 2 g 50 mL (0 g Intravenous Stopped 07/08/16 1610)     Initial Impression / Assessment and Plan / ED Course  I have reviewed the triage vital signs and the nursing notes.  Pertinent labs & imaging results that were available during my care of the patient were reviewed by me and considered in my medical decision making (see chart for details).     Patient presents to the ED with multiple complaints. Patient complains of chronic right ankle and leg pain that is acutely worsening along with edema. Seen by PCP today sent to the ED for evaluation of DVT. Patient also complains of migraine headache. Patient denies any sudden or maximal at onset. No focal neuro deficits. Pt HA treated and improved while in ED.  Presentation is non concerning for [facility], ICH, Meningitis, or temporal arteritis. Pt is afebrile with no focal neuro deficits, nuchal rigidity, or change in vision. Patient with edema to the right lower extremity. Minimal erythema or  warmth unconcerning for cellulitis. Patient does have a small wound to the lateral right foot concerning for diabetic ulcer. Will start patient on antibiotics. X-ray showed no abnormalities. Will follow-up with podiatry and wound care. Unable to perform ultrasound tonight. Patient given subcutaneous Lovenox given the creatinine was normal. Scheduled for outpatient lower extremity ultrasound this morning at [facility] Mild leukocytosis noted. Thrombocytopenia noted. Encouraged fu for repeat labs. Potassium was noted to be low. Was replaced with oral potassium will be sent home with short course of oral potassium. Mild hyponatremia noted. 2 L fluid bolus was given. Hypomagnesemia noted. 2 g of magnesium replaced. CBG was 396. Patient was given 2 units of subcutaneous insulin. Patient is not currently on medication. We'll follow up with his PCP today for blood sugar recheck and management of diabetes. No signs of DKA given that bicarbonate and anion gap was normal. Creatinine normal. Pain in right lower extremity appears to be chronic. Patient with history of same and has been seen in the past for same. Doubt cellulitis but will start on antibiotics. Low suspicion for DVT but will obtain ultrasound. Feel that the increasing pain is due to the recent d/c of narcotic pain medicine. Patient denies any chest pain or shortness of breath concerning for PE or ACS. Patient needs close follow-up primary care doctor to address chronic conditions. Pt is hemodynamically stable, in NAD, & able to ambulate in the ED. Pain has been managed & has no complaints prior to dc. Pt is comfortable with above plan and is stable for discharge at this time. All questions were answered prior to disposition. Strict return precautions for f/u to the ED were discussed. Dicussed pt with Dr. Nicanor Alcon who is agreeable to the above plan.        Final Clinical Impressions(s) / ED Diagnoses   Final diagnoses:  Lower extremity edema  Hypokalemia   Hypomagnesemia  Elevated blood sugar  Other chronic pain    New Prescriptions Discharge Medication List as of 07/08/2016  6:25 AM    START taking these medications   Details  potassium chloride SA (K-DUR,KLOR-CON) 20 MEQ tablet Take 1 tablet (20 mEq total) by mouth 2 (two) times daily., Starting Tue 07/08/2016, Print  I personally performed the services described in this documentation, which was scribed in my presence. The recorded information has been reviewed and is accurate.     Rise Mu, PA-C 07/08/16 0803    Cy Blamer, MD 07/08/16 [phone]

## 2016-07-08 NOTE — ED Notes (Signed)
Patient verbalizes understanding of discharge instructions, prescriptions, home care and follow up care. Patient out of department at this time. 

## 2016-07-08 NOTE — Progress Notes (Signed)
VASCULAR LAB PRELIMINARY  PRELIMINARY  PRELIMINARY  PRELIMINARY  Right lower extremity venous duplex completed.    Preliminary report:  Right:  No obvious evidence of DVT, superficial thrombosis, or Baker's cyst.  Kerry-Anne Mezo, RVS 07/08/2016, 8:50 AM

## 2016-07-11 ENCOUNTER — Encounter (HOSPITAL_BASED_OUTPATIENT_CLINIC_OR_DEPARTMENT_OTHER): Payer: Self-pay | Admitting: *Deleted

## 2016-07-11 ENCOUNTER — Emergency Department (HOSPITAL_BASED_OUTPATIENT_CLINIC_OR_DEPARTMENT_OTHER): Payer: Medicaid Other

## 2016-07-11 ENCOUNTER — Emergency Department (HOSPITAL_BASED_OUTPATIENT_CLINIC_OR_DEPARTMENT_OTHER)
Admission: EM | Admit: 2016-07-11 | Discharge: 2016-07-11 | Disposition: A | Discharge status: Home / Self Care | Payer: Medicaid Other | Attending: Emergency Medicine | Admitting: Emergency Medicine

## 2016-07-11 DIAGNOSIS — E1165 Type 2 diabetes mellitus with hyperglycemia: Secondary | ICD-10-CM | POA: Diagnosis not present

## 2016-07-11 DIAGNOSIS — L97519 Non-pressure chronic ulcer of other part of right foot with unspecified severity: Secondary | ICD-10-CM | POA: Diagnosis not present

## 2016-07-11 DIAGNOSIS — Z7984 Long term (current) use of oral hypoglycemic drugs: Secondary | ICD-10-CM | POA: Insufficient documentation

## 2016-07-11 DIAGNOSIS — R739 Hyperglycemia, unspecified: Secondary | ICD-10-CM

## 2016-07-11 DIAGNOSIS — E118 Type 2 diabetes mellitus with unspecified complications: Secondary | ICD-10-CM

## 2016-07-11 DIAGNOSIS — F17210 Nicotine dependence, cigarettes, uncomplicated: Secondary | ICD-10-CM | POA: Insufficient documentation

## 2016-07-11 DIAGNOSIS — M79671 Pain in right foot: Secondary | ICD-10-CM | POA: Diagnosis present

## 2016-07-11 DIAGNOSIS — R509 Fever, unspecified: Secondary | ICD-10-CM | POA: Insufficient documentation

## 2016-07-11 DIAGNOSIS — E11621 Type 2 diabetes mellitus with foot ulcer: Secondary | ICD-10-CM | POA: Insufficient documentation

## 2016-07-11 LAB — BASIC METABOLIC PANEL
Anion gap: 10 (ref 5–15)
BUN: 13 mg/dL (ref 6–20)
CO2: 31 mmol/L (ref 22–32)
Calcium: 8.8 mg/dL — ABNORMAL LOW (ref 8.9–10.3)
Chloride: 87 mmol/L — ABNORMAL LOW (ref 101–111)
Creatinine, Ser: 0.74 mg/dL (ref 0.61–1.24)
Glucose, Bld: 361 mg/dL — ABNORMAL HIGH (ref 65–99)
Potassium: 3.9 mmol/L (ref 3.5–5.1)
Sodium: 128 mmol/L — ABNORMAL LOW (ref 135–145)

## 2016-07-11 LAB — CBC WITH DIFFERENTIAL/PLATELET
BASOS ABS: 0 10*3/uL (ref 0.0–0.1)
Basophils Relative: 0 %
Eosinophils Absolute: 0 10*3/uL (ref 0.0–0.7)
Eosinophils Relative: 0 %
HEMATOCRIT: 38 % — AB (ref 39.0–52.0)
Hemoglobin: 13 g/dL (ref 13.0–17.0)
LYMPHS ABS: 1.3 10*3/uL (ref 0.7–4.0)
Lymphocytes Relative: 11 %
MCH: 31.8 pg (ref 26.0–34.0)
MCHC: 34.2 g/dL (ref 30.0–36.0)
MCV: 92.9 fL (ref 78.0–100.0)
MONOS PCT: 10 %
Monocytes Absolute: 1.2 10*3/uL — ABNORMAL HIGH (ref 0.1–1.0)
NEUTROS PCT: 79 %
Neutro Abs: 9 10*3/uL — ABNORMAL HIGH (ref 1.7–7.7)
PLATELETS: 196 10*3/uL (ref 150–400)
RBC: 4.09 MIL/uL — AB (ref 4.22–5.81)
RDW: 12.4 % (ref 11.5–15.5)
WBC: 11.5 10*3/uL — AB (ref 4.0–10.5)

## 2016-07-11 LAB — URINALYSIS, ROUTINE W REFLEX MICROSCOPIC
Glucose, UA: >=500 mg/dL — AB
Hgb urine dipstick: NEGATIVE
KETONES UR: 15 mg/dL — AB
Leukocytes, UA: NEGATIVE
Nitrite: POSITIVE — AB
Protein, ur: NEGATIVE mg/dL
Specific Gravity, Urine: 1.029 (ref 1.005–1.030)
pH: 5.5 (ref 5.0–8.0)

## 2016-07-11 LAB — I-STAT CG4 LACTIC ACID, ED: Lactic Acid, Venous: 1.17 mmol/L (ref 0.5–1.9)

## 2016-07-11 LAB — URINALYSIS, MICROSCOPIC (REFLEX): WBC UA: NONE SEEN WBC/hpf (ref 0–5)

## 2016-07-11 MED ORDER — CLINDAMYCIN HCL 150 MG PO CAPS: 450.0000 mg | ORAL_CAPSULE | Freq: Three times a day (TID) | ORAL | 0 refills | Status: DC

## 2016-07-11 MED ORDER — SODIUM CHLORIDE 0.9 % IV BOLUS (SEPSIS)
1000.0000 mL | Freq: Once | INTRAVENOUS | Status: AC
  Administered 2016-07-11: 1000 mL via INTRAVENOUS

## 2016-07-11 MED ORDER — METFORMIN HCL 1000 MG PO TABS: 500.0000 mg | ORAL_TABLET | Freq: Two times a day (BID) | ORAL | 0 refills | Status: DC

## 2016-07-11 MED ORDER — CLINDAMYCIN HCL 150 MG PO CAPS
450.0000 mg | ORAL_CAPSULE | Freq: Once | ORAL | Status: AC
  Administered 2016-07-11: 450 mg via ORAL
  Filled 2016-07-11: qty 3

## 2016-07-11 MED ORDER — KETOROLAC TROMETHAMINE 30 MG/ML IJ SOLN
30.0000 mg | Freq: Once | INTRAMUSCULAR | Status: AC
  Administered 2016-07-11: 30 mg via INTRAVENOUS
  Filled 2016-07-11: qty 1

## 2016-07-11 MED FILL — CLINDAMYCIN HCL 150 MG CAP: 150 | 14 days supply | Qty: 118 | Fill #0

## 2016-07-11 MED FILL — metFORMIN HCL 1000 MG TABS: 1000 | 15 days supply | Qty: 30 | Fill #0

## 2016-07-11 NOTE — ED Notes (Signed)
Pt in wheelchair. Able to stand and pivot to bed with x1 max assist.

## 2016-07-11 NOTE — ED Provider Notes (Addendum)
[facility] Provider Note   CSN: [id number] Arrival date & time: 07/11/16  4098     History   Chief Complaint Chief Complaint  Patient presents with  . Ankle Pain    HPI Brian Day is a 36 y.o. male.  HPI 36 year old male who presents with right foot pain. He has a history of diabetes with poor medication compliance, hypertension, and morbid obesity. Reports a chronic right ankle swelling and ankle pain due to fall several years ago, but since one week ago has had swelling to the right foot and worsening pain that is different from his chronic symptoms. States that he was wearing poor fitting shoes for a while, and just recently noticed a blister to the right fifth toe. Was seen in the emergency department on April 16 for evaluation of this. He had a normal ultrasound of the lower extremities, without DVT that was performed today later. He was started on Keflex for potential diabetic foot ulcer. He has been compliant with his medications over the past 3 days. States that [place] [facility] Neosporin over his diabetic wound, and yesterday noted purulent drainage from it. He has had some increased swelling and redness overlying the lateral aspect of the right foot, but since it has slowly been improving over the past day after pus drained from it. He did have fever 5 days ago, but now resolved. No nausea, vomiting, chest pain or difficulty breathing, abdominal pain, numbness or weakness.  Past Medical History:  Diagnosis Date  . Addiction, opium (HCC)   . Anxiety   . Depression   . Diabetes mellitus without complication (HCC)   . Hypertension   . Morbid obesity [facility])     Patient Active Problem List   Diagnosis Date Noted  . Influenza with pneumonia 06/08/2014  . C. difficile colitis 06/08/2014  . CAP (community acquired pneumonia) 06/07/2014  . Hypokalemia 11/23/2012  . Diabetes type 2 - Diet controlled 11/23/2012  . Morbid obesity (HCC) 11/23/2012  . Low back pain  11/23/2012  . Fungal infection of skin of abdomen 11/23/2012    Past Surgical History:  Procedure Laterality Date  . KNEE SURGERY         Home Medications    Prior to Admission medications   Medication Sig Start Date End Date Taking? Authorizing Provider  albuterol (PROVENTIL HFA;VENTOLIN HFA) 108 (90 BASE) MCG/ACT inhaler Inhale 1-2 puffs into the lungs every 6 (six) hours as needed for wheezing or shortness of breath. 08/23/14   Nelva Nay, MD  cephALEXin (KEFLEX) 500 MG capsule Take 1 capsule (500 mg total) by mouth 4 (four) times daily. 07/08/16   Rise Mu, PA-C  clindamycin (CLEOCIN) 150 MG capsule Take 3 capsules (450 mg total) by mouth 3 (three) times daily. 07/11/16   Lavera Guise, MD  guaiFENesin-codeine 100-10 MG/5ML syrup Take 5 mLs by mouth every 4 (four) hours as needed for cough. 06/06/14   Kristen N Ward, DO  hydrochlorothiazide (HYDRODIURIL) 25 MG tablet Take 25 mg by mouth daily.    Historical Provider, MD  ibuprofen (ADVIL,MOTRIN) 400 MG tablet Take 2 tablets (800 mg total) by mouth every 8 (eight) hours as needed for mild pain. 06/09/14   Calvert Cantor, MD  metFORMIN (GLUCOPHAGE) 1000 MG tablet Take 1,000 mg by mouth 2 (two) times daily with a meal.    Historical Provider, MD  metFORMIN (GLUCOPHAGE) 1000 MG tablet Take 0.5 tablets (500 mg total) by mouth 2 (two) times daily with a meal. 07/11/16  Lavera Guise, MD  methocarbamol (ROBAXIN) 500 MG tablet Take 2 tablets (1,000 mg total) by mouth 4 (four) times daily as needed (Pain). 12/03/14   Nicole Pisciotta, PA-C  olmesartan (BENICAR) 20 MG tablet Take 20 mg by mouth daily.    Historical Provider, MD  Omeprazole (PRILOSEC PO) Take by mouth.    Historical Provider, MD  oxyCODONE-acetaminophen (PERCOCET) 10-325 MG per tablet Take 1 tablet by mouth 4 (four) times daily.    Historical Provider, MD  potassium chloride SA (K-DUR,KLOR-CON) 20 MEQ tablet Take 1 tablet (20 mEq total) by mouth 2 (two) times daily. 07/08/16    Rise Mu, PA-C    Family History Family History  Problem Relation Age of Onset  . Diabetes Mother   . Hypertension Mother   . Hypertension Father     Social History Social History  Substance Use Topics  . Smoking status: Current Every Day Smoker    Packs/day: 0.50    Types: Cigarettes  . Smokeless tobacco: Never Used  . Alcohol use No     Allergies   Lisinopril   Review of Systems Review of Systems  Constitutional: Positive for fever.  HENT: Negative for congestion.   Respiratory: Negative for cough and shortness of breath.   Cardiovascular: Negative for chest pain.  Gastrointestinal: Negative for abdominal pain, nausea and vomiting.  Genitourinary: Negative for difficulty urinating.  Skin: Positive for wound.  Hematological: Does not bruise/bleed easily.  Psychiatric/Behavioral: Negative for confusion.  All other systems reviewed and are negative.    Physical Exam Updated Vital Signs BP (!) 147/87 (BP Location: Right Arm)   Pulse 96   Temp 98.6 F (37 C) (Oral)   Resp 18   Ht  (1.905 m)   Wt (!) 456 lb (206.8 kg)   SpO2 96%   BMI 57.00 kg/m   Physical Exam Physical Exam  Nursing note and vitals reviewed. Constitutional:  non-toxic, and in no acute distress Head: Normocephalic and atraumatic.  Mouth/Throat: Oropharynx is clear and moist.  Neck: Normal range of motion. Neck supple.  Cardiovascular: Normal rate and regular rhythm.   Pulmonary/Chest: Effort normal and breath sounds normal.  Abdominal: Soft. There is no tenderness. There is no rebound and no guarding.  Musculoskeletal: soft tissue swelling over right foot to above the right lateral ankle. There is mild erythema over the lateral aspect of the right foot. There is wound measuring quarter side to the lateral aspect of the right 5th toe. No active drainage. Wound open to drainage. Neurological: Alert, no facial droop, fluent speech, moves all extremities symmetrically Skin:  Skin is warm and dry.  Psychiatric: Cooperative   ED Treatments / Results  Labs (all labs ordered are listed, but only abnormal results are displayed) Labs Reviewed  CBC WITH DIFFERENTIAL/PLATELET - Abnormal; Notable for the following:       Result Value   WBC 11.5 (*)    RBC 4.09 (*)    HCT 38.0 (*)    Neutro Abs 9.0 (*)    Monocytes Absolute 1.2 (*)    All other components within normal limits  BASIC METABOLIC PANEL - Abnormal; Notable for the following:    Sodium 128 (*)    Chloride 87 (*)    Glucose, Bld 361 (*)    Calcium 8.8 (*)    All other components within normal limits  URINALYSIS, ROUTINE W REFLEX MICROSCOPIC - Abnormal; Notable for the following:    Color, Urine ORANGE (*)  Glucose, UA >=500 (*)    Bilirubin Urine MODERATE (*)    Ketones, ur 15 (*)    Nitrite POSITIVE (*)    All other components within normal limits  URINALYSIS, MICROSCOPIC (REFLEX) - Abnormal; Notable for the following:    Bacteria, UA FEW (*)    Squamous Epithelial / LPF 0-5 (*)    All other components within normal limits  URINE CULTURE  I-STAT CG4 LACTIC ACID, ED    EKG  EKG Interpretation None       Radiology Dg Foot Complete Right  Result Date: 07/11/2016 CLINICAL DATA:  36 year old male with diabetes and right lateral foot pain for the past 2 weeks EXAM: RIGHT FOOT COMPLETE - 3+ VIEW COMPARISON:  Recent prior radiographs of the right foot and ankle performed at [facility] on 07/08/2016 FINDINGS: 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 No evidence of acute fracture, malalignment or irregularity involving the fifth metatarsal or fifth digit. Mild degenerative changes present in the midfoot at the talonavicular joint. Plantar calcaneal spurring. There is soft tissue swelling about the forefoot. No acute fracture, malalignment or osseous lesion. IMPRESSION: No significant interval change in the appearance of  the foot compared to recent imaging from 07/08/2016. 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 Electronically Signed   By: Malachy Moan M.D.   On: 07/11/2016 09:40    Procedures Procedures (including critical care time)  Medications Ordered in ED Medications  clindamycin (CLEOCIN) capsule 450 mg (not administered)  ketorolac (TORADOL) 30 MG/ML injection 30 mg (30 mg Intravenous Given 07/11/16 0940)  sodium chloride 0.9 % bolus 1,000 mL (1,000 mLs Intravenous New Bag/Given 07/11/16 0940)     Initial Impression / Assessment and Plan / ED Course  I have reviewed the triage vital signs and the nursing notes.  Pertinent labs & imaging results that were available during my care of the patient were reviewed by me and considered in my medical decision making (see chart for details).     Presents with right foot pain and swelling, ongoing for one week. He is non-toxic and in no acute distress. Vitals stable. There is open diabetic ulcer w/o drainage over lateral right 5th toe. There is soft tissue swelling and mild erythema over lateral foot. With recent purulent drainage, concern for infected diabetic ulcer. No systemic signs or symptoms of illness currently. Minimal leukocytosis of 11. Hyperglycemia Present, but he states that he has not been on any medications. Has had metformin written for him in the past, but states that he was never taking this medication for diabetes. We'll start him back on that for men and have him follow-up closely with his PCP for up titration of this medication. No evidence of DKA today. UA with nitrites but no leukocytes or WBCs. He has no symptoms of UTI. Will not treat.  Given that there has been some purulent drainage from his right wound, will start on clindamycin. Given no major systemic signs or symptoms of illness will manage as outpatient for now. He does have follow-up already he states on Monday with  his PCP. I have discussed strict return instructions, including fever, worsening redness or swelling, and other factors that would require him to return and be admitted into the hospital for management. He expressed understanding of all discharge instructions and felt comfortable with the plan of care.   Final Clinical Impressions(s) / ED Diagnoses   Final  diagnoses:  Hyperglycemia  Diabetic foot (HCC)    New Prescriptions New Prescriptions   CLINDAMYCIN (CLEOCIN) 150 MG CAPSULE    Take 3 capsules (450 mg total) by mouth 3 (three) times daily.   METFORMIN (GLUCOPHAGE) 1000 MG TABLET    Take 0.5 tablets (500 mg total) by mouth 2 (two) times daily with a meal.     Lavera Guise, MD 07/11/16 1043    Lavera Guise, MD 07/11/16 1044

## 2016-07-11 NOTE — ED Notes (Signed)
Pt directed to pharmacy to pick up Rx. D/c home with family

## 2016-07-11 NOTE — Discharge Instructions (Signed)
Please take metformin for diabetes and let your primary care doctor know that you have been without medications for your diabetes. He will need to titrate your metformin.  Please take antibiotics as prescribed. Return for worsening symptoms, including fever, worsening swelling/redness, confusion, or any other symptoms concerning to you.

## 2016-07-11 NOTE — ED Triage Notes (Signed)
Pt reports right ankle and foot pain that is chronic.  Reports that wound on right foot is worsened than when he was seen 3 days ago. Pt requesting MRI

## 2016-07-12 LAB — URINE CULTURE: Culture: NO GROWTH

## 2016-07-15 ENCOUNTER — Encounter (HOSPITAL_COMMUNITY): Payer: Self-pay | Admitting: *Deleted

## 2016-07-15 ENCOUNTER — Inpatient Hospital Stay (HOSPITAL_COMMUNITY)
Admission: EM | Admit: 2016-07-15 | Discharge: 2016-07-22 | DRG: 638 | Disposition: A | Discharge status: Home / Self Care | Payer: Medicaid Other | Attending: Internal Medicine | Admitting: Internal Medicine

## 2016-07-15 DIAGNOSIS — L97519 Non-pressure chronic ulcer of other part of right foot with unspecified severity: Secondary | ICD-10-CM

## 2016-07-15 DIAGNOSIS — R609 Edema, unspecified: Secondary | ICD-10-CM | POA: Diagnosis not present

## 2016-07-15 DIAGNOSIS — L0390 Cellulitis, unspecified: Secondary | ICD-10-CM

## 2016-07-15 DIAGNOSIS — F329 Major depressive disorder, single episode, unspecified: Secondary | ICD-10-CM

## 2016-07-15 DIAGNOSIS — L97518 Non-pressure chronic ulcer of other part of right foot with other specified severity: Secondary | ICD-10-CM | POA: Diagnosis not present

## 2016-07-15 DIAGNOSIS — Z794 Long term (current) use of insulin: Secondary | ICD-10-CM | POA: Diagnosis not present

## 2016-07-15 DIAGNOSIS — E11621 Type 2 diabetes mellitus with foot ulcer: Secondary | ICD-10-CM

## 2016-07-15 DIAGNOSIS — M79671 Pain in right foot: Secondary | ICD-10-CM

## 2016-07-15 DIAGNOSIS — Z8249 Family history of ischemic heart disease and other diseases of the circulatory system: Secondary | ICD-10-CM | POA: Diagnosis not present

## 2016-07-15 DIAGNOSIS — M86671 Other chronic osteomyelitis, right ankle and foot: Secondary | ICD-10-CM | POA: Diagnosis present

## 2016-07-15 DIAGNOSIS — L02415 Cutaneous abscess of right lower limb: Secondary | ICD-10-CM | POA: Diagnosis present

## 2016-07-15 DIAGNOSIS — I70235 Atherosclerosis of native arteries of right leg with ulceration of other part of foot: Secondary | ICD-10-CM | POA: Diagnosis present

## 2016-07-15 DIAGNOSIS — E11628 Type 2 diabetes mellitus with other skin complications: Secondary | ICD-10-CM | POA: Diagnosis not present

## 2016-07-15 DIAGNOSIS — L03115 Cellulitis of right lower limb: Secondary | ICD-10-CM | POA: Diagnosis present

## 2016-07-15 DIAGNOSIS — Z833 Family history of diabetes mellitus: Secondary | ICD-10-CM | POA: Diagnosis not present

## 2016-07-15 DIAGNOSIS — M869 Osteomyelitis, unspecified: Secondary | ICD-10-CM

## 2016-07-15 DIAGNOSIS — IMO0002 Reserved for concepts with insufficient information to code with codable children: Secondary | ICD-10-CM | POA: Diagnosis present

## 2016-07-15 DIAGNOSIS — Z6841 Body Mass Index (BMI) 40.0 and over, adult: Secondary | ICD-10-CM

## 2016-07-15 DIAGNOSIS — M79609 Pain in unspecified limb: Secondary | ICD-10-CM | POA: Diagnosis not present

## 2016-07-15 DIAGNOSIS — Z7984 Long term (current) use of oral hypoglycemic drugs: Secondary | ICD-10-CM | POA: Diagnosis not present

## 2016-07-15 DIAGNOSIS — E1165 Type 2 diabetes mellitus with hyperglycemia: Secondary | ICD-10-CM | POA: Diagnosis present

## 2016-07-15 DIAGNOSIS — E1142 Type 2 diabetes mellitus with diabetic polyneuropathy: Secondary | ICD-10-CM | POA: Diagnosis present

## 2016-07-15 DIAGNOSIS — I10 Essential (primary) hypertension: Secondary | ICD-10-CM | POA: Diagnosis present

## 2016-07-15 DIAGNOSIS — M86171 Other acute osteomyelitis, right ankle and foot: Secondary | ICD-10-CM | POA: Diagnosis not present

## 2016-07-15 DIAGNOSIS — F1120 Opioid dependence, uncomplicated: Secondary | ICD-10-CM | POA: Diagnosis not present

## 2016-07-15 DIAGNOSIS — G8921 Chronic pain due to trauma: Secondary | ICD-10-CM | POA: Diagnosis not present

## 2016-07-15 DIAGNOSIS — L97501 Non-pressure chronic ulcer of other part of unspecified foot limited to breakdown of skin: Secondary | ICD-10-CM | POA: Diagnosis not present

## 2016-07-15 DIAGNOSIS — E1151 Type 2 diabetes mellitus with diabetic peripheral angiopathy without gangrene: Secondary | ICD-10-CM | POA: Diagnosis present

## 2016-07-15 DIAGNOSIS — M25571 Pain in right ankle and joints of right foot: Secondary | ICD-10-CM | POA: Diagnosis not present

## 2016-07-15 DIAGNOSIS — F17210 Nicotine dependence, cigarettes, uncomplicated: Secondary | ICD-10-CM | POA: Diagnosis present

## 2016-07-15 DIAGNOSIS — M1A0710 Idiopathic chronic gout, right ankle and foot, without tophus (tophi): Secondary | ICD-10-CM

## 2016-07-15 DIAGNOSIS — G8929 Other chronic pain: Secondary | ICD-10-CM | POA: Diagnosis not present

## 2016-07-15 DIAGNOSIS — Z888 Allergy status to other drugs, medicaments and biological substances status: Secondary | ICD-10-CM | POA: Diagnosis not present

## 2016-07-15 DIAGNOSIS — M868X7 Other osteomyelitis, ankle and foot: Secondary | ICD-10-CM | POA: Diagnosis not present

## 2016-07-15 DIAGNOSIS — E1169 Type 2 diabetes mellitus with other specified complication: Secondary | ICD-10-CM | POA: Diagnosis present

## 2016-07-15 DIAGNOSIS — Z79891 Long term (current) use of opiate analgesic: Secondary | ICD-10-CM | POA: Diagnosis not present

## 2016-07-15 DIAGNOSIS — E118 Type 2 diabetes mellitus with unspecified complications: Secondary | ICD-10-CM

## 2016-07-15 HISTORY — DX: Gastro-esophageal reflux disease without esophagitis: K21.9

## 2016-07-15 HISTORY — DX: Type 2 diabetes mellitus without complications: E11.9

## 2016-07-15 HISTORY — DX: Pneumonia, unspecified organism: J18.9

## 2016-07-15 HISTORY — DX: Unspecified osteoarthritis, unspecified site: M19.90

## 2016-07-15 HISTORY — DX: Migraine, unspecified, not intractable, without status migrainosus: G43.909

## 2016-07-15 LAB — CBC WITH DIFFERENTIAL/PLATELET
Basophils Absolute: 0 K/uL (ref 0.0–0.1)
Basophils Relative: 0 %
Eosinophils Absolute: 0.2 K/uL (ref 0.0–0.7)
Eosinophils Relative: 2 %
HCT: 41.9 % (ref 39.0–52.0)
Hemoglobin: 14.4 g/dL (ref 13.0–17.0)
Lymphocytes Relative: 18 %
Lymphs Abs: 2 K/uL (ref 0.7–4.0)
MCH: 31.4 pg (ref 26.0–34.0)
MCHC: 34.4 g/dL (ref 30.0–36.0)
MCV: 91.3 fL (ref 78.0–100.0)
Monocytes Absolute: 1.1 K/uL — ABNORMAL HIGH (ref 0.1–1.0)
Monocytes Relative: 10 %
Neutro Abs: 7.7 K/uL (ref 1.7–7.7)
Neutrophils Relative %: 70 %
Platelets: 286 K/uL (ref 150–400)
RBC: 4.59 MIL/uL (ref 4.22–5.81)
RDW: 12.6 % (ref 11.5–15.5)
WBC: 11 K/uL — ABNORMAL HIGH (ref 4.0–10.5)

## 2016-07-15 LAB — COMPREHENSIVE METABOLIC PANEL
ALBUMIN: 2.7 g/dL — AB (ref 3.5–5.0)
ALT: 38 U/L (ref 17–63)
ANION GAP: 12 (ref 5–15)
AST: 32 U/L (ref 15–41)
Alkaline Phosphatase: 90 U/L (ref 38–126)
BUN: 9 mg/dL (ref 6–20)
CHLORIDE: 90 mmol/L — AB (ref 101–111)
CO2: 27 mmol/L (ref 22–32)
Calcium: 9.3 mg/dL (ref 8.9–10.3)
Creatinine, Ser: 0.72 mg/dL (ref 0.61–1.24)
GFR calc Af Amer: >60 mL/min (ref >60)
GFR calc non Af Amer: >60 mL/min (ref >60)
GLUCOSE: 392 mg/dL — AB (ref 65–99)
POTASSIUM: 4.6 mmol/L (ref 3.5–5.1)
Sodium: 129 mmol/L — ABNORMAL LOW (ref 135–145)
Total Bilirubin: 0.8 mg/dL (ref 0.3–1.2)
Total Protein: 7.9 g/dL (ref 6.5–8.1)

## 2016-07-15 LAB — SEDIMENTATION RATE: Sed Rate: 113 mm/hr — ABNORMAL HIGH (ref 0–16)

## 2016-07-15 LAB — I-STAT CG4 LACTIC ACID, ED
LACTIC ACID, VENOUS: 2.19 mmol/L — AB (ref 0.5–1.9)
Lactic Acid, Venous: 1.58 mmol/L (ref 0.5–1.9)

## 2016-07-15 LAB — PREALBUMIN: PREALBUMIN: 6 mg/dL — AB (ref 18–38)

## 2016-07-15 LAB — C-REACTIVE PROTEIN: CRP: 10.6 mg/dL — AB (ref <1.0)

## 2016-07-15 MED ORDER — PANTOPRAZOLE SODIUM 40 MG PO TBEC
40.0000 mg | DELAYED_RELEASE_TABLET | Freq: Every day | ORAL | Status: DC
  Administered 2016-07-15 – 2016-07-22 (×8): 40 mg via ORAL
  Filled 2016-07-15 (×9): qty 1

## 2016-07-15 MED ORDER — OXYCODONE-ACETAMINOPHEN 5-325 MG PO TABS
1.0000 | ORAL_TABLET | ORAL | Status: DC | PRN
  Administered 2016-07-15 – 2016-07-20 (×18): 1 via ORAL
  Filled 2016-07-15 (×18): qty 1

## 2016-07-15 MED ORDER — INSULIN ASPART 100 UNIT/ML ~~LOC~~ SOLN
0.0000 [IU] | SUBCUTANEOUS | Status: DC
  Administered 2016-07-15 – 2016-07-16 (×3): 11 [IU] via SUBCUTANEOUS
  Administered 2016-07-16: 5 [IU] via SUBCUTANEOUS
  Administered 2016-07-16: 8 [IU] via SUBCUTANEOUS
  Administered 2016-07-16: 3 [IU] via SUBCUTANEOUS
  Administered 2016-07-16: 8 [IU] via SUBCUTANEOUS
  Administered 2016-07-17: 3 [IU] via SUBCUTANEOUS
  Administered 2016-07-17: 8 [IU] via SUBCUTANEOUS
  Administered 2016-07-17: 11 [IU] via SUBCUTANEOUS
  Administered 2016-07-17: 5 [IU] via SUBCUTANEOUS
  Administered 2016-07-17: 11 [IU] via SUBCUTANEOUS
  Administered 2016-07-17: 5 [IU] via SUBCUTANEOUS
  Administered 2016-07-18: 8 [IU] via SUBCUTANEOUS
  Administered 2016-07-18 (×2): 3 [IU] via SUBCUTANEOUS
  Administered 2016-07-18 (×2): 5 [IU] via SUBCUTANEOUS
  Administered 2016-07-18: 8 [IU] via SUBCUTANEOUS
  Administered 2016-07-19 (×2): 5 [IU] via SUBCUTANEOUS

## 2016-07-15 MED ORDER — ACETAMINOPHEN 650 MG RE SUPP: 650.0000 mg | Freq: Four times a day (QID) | RECTAL | Status: DC | PRN

## 2016-07-15 MED ORDER — BUPRENORPHINE HCL-NALOXONE HCL 2-0.5 MG SL SUBL
2.0000 | SUBLINGUAL_TABLET | SUBLINGUAL | Status: DC
  Administered 2016-07-15 – 2016-07-16 (×3): 2 via SUBLINGUAL
  Filled 2016-07-15 (×4): qty 2

## 2016-07-15 MED ORDER — ACETAMINOPHEN 325 MG PO TABS: 650.0000 mg | ORAL_TABLET | Freq: Four times a day (QID) | ORAL | Status: DC | PRN

## 2016-07-15 MED ORDER — VANCOMYCIN HCL IN DEXTROSE 1-5 GM/200ML-% IV SOLN
1000.0000 mg | Freq: Once | INTRAVENOUS | Status: AC
  Administered 2016-07-15: 1000 mg via INTRAVENOUS
  Filled 2016-07-15: qty 200

## 2016-07-15 MED ORDER — BUPRENORPHINE HCL-NALOXONE HCL 8.6-2.1 MG SL SUBL: 1.0000 | SUBLINGUAL_TABLET | Freq: Every morning | SUBLINGUAL | Status: DC

## 2016-07-15 MED ORDER — BUPRENORPHINE HCL-NALOXONE HCL 8-2 MG SL SUBL
1.0000 | SUBLINGUAL_TABLET | SUBLINGUAL | Status: DC
  Administered 2016-07-16 – 2016-07-17 (×2): 1 via SUBLINGUAL
  Filled 2016-07-15 (×6): qty 1

## 2016-07-15 MED ORDER — VANCOMYCIN HCL 10 G IV SOLR
1750.0000 mg | Freq: Three times a day (TID) | INTRAVENOUS | Status: DC
  Administered 2016-07-16 – 2016-07-20 (×16): 1750 mg via INTRAVENOUS
  Filled 2016-07-15 (×18): qty 1750

## 2016-07-15 MED ORDER — HEPARIN SODIUM (PORCINE) 5000 UNIT/ML IJ SOLN
5000.0000 [IU] | Freq: Three times a day (TID) | INTRAMUSCULAR | Status: DC
  Administered 2016-07-15 – 2016-07-22 (×20): 5000 [IU] via SUBCUTANEOUS
  Filled 2016-07-15 (×20): qty 1

## 2016-07-15 MED ORDER — OXYCODONE HCL 5 MG PO TABS
5.0000 mg | ORAL_TABLET | ORAL | Status: DC | PRN
  Administered 2016-07-15 – 2016-07-20 (×17): 5 mg via ORAL
  Filled 2016-07-15 (×17): qty 1

## 2016-07-15 MED ORDER — SERTRALINE HCL 100 MG PO TABS
100.0000 mg | ORAL_TABLET | Freq: Every day | ORAL | Status: DC
  Administered 2016-07-15 – 2016-07-22 (×8): 100 mg via ORAL
  Filled 2016-07-15 (×8): qty 1

## 2016-07-15 MED ORDER — HYDROCHLOROTHIAZIDE 25 MG PO TABS
25.0000 mg | ORAL_TABLET | Freq: Every day | ORAL | Status: DC
  Administered 2016-07-15 – 2016-07-21 (×7): 25 mg via ORAL
  Filled 2016-07-15 (×7): qty 1

## 2016-07-15 MED ORDER — GABAPENTIN 300 MG PO CAPS
900.0000 mg | ORAL_CAPSULE | Freq: Three times a day (TID) | ORAL | Status: DC
  Administered 2016-07-15 – 2016-07-22 (×20): 900 mg via ORAL
  Filled 2016-07-15 (×20): qty 3

## 2016-07-15 MED ORDER — ENSURE ENLIVE PO LIQD
237.0000 mL | Freq: Two times a day (BID) | ORAL | Status: DC
  Administered 2016-07-16 – 2016-07-22 (×13): 237 mL via ORAL

## 2016-07-15 MED ORDER — SODIUM CHLORIDE 0.9% FLUSH
3.0000 mL | Freq: Two times a day (BID) | INTRAVENOUS | Status: DC
  Administered 2016-07-15 – 2016-07-20 (×8): 3 mL via INTRAVENOUS

## 2016-07-15 MED ORDER — INSULIN ASPART 100 UNIT/ML ~~LOC~~ SOLN
8.0000 [IU] | Freq: Once | SUBCUTANEOUS | Status: AC
  Administered 2016-07-15: 8 [IU] via SUBCUTANEOUS
  Filled 2016-07-15: qty 1

## 2016-07-15 MED ORDER — SODIUM CHLORIDE 0.9 % IV BOLUS (SEPSIS)
1000.0000 mL | Freq: Once | INTRAVENOUS | Status: AC
  Administered 2016-07-15: 1000 mL via INTRAVENOUS

## 2016-07-15 MED ORDER — PROMETHAZINE HCL 25 MG PO TABS: 12.5000 mg | ORAL_TABLET | Freq: Four times a day (QID) | ORAL | Status: DC | PRN

## 2016-07-15 MED ORDER — SENNOSIDES-DOCUSATE SODIUM 8.6-50 MG PO TABS: 1.0000 | ORAL_TABLET | Freq: Every evening | ORAL | Status: DC | PRN

## 2016-07-15 NOTE — ED Provider Notes (Signed)
[facility]PT Provider Note   CSN: [id number] Arrival date & time: 07/15/16  1149     History   Chief Complaint Chief Complaint  Patient presents with  . Foot Pain    HPI Brian Day is a 36 y.o. male.  HPI Complains of right foot pain and right ankle pain radiating to right calf onset 1.5 weeks ago, progressively worsening. She's been treated for cellulitis with Keflex and with clindamycin, without relief. He reports fever of 101.2 approximately 8 days ago. No nausea or vomiting. Patient also had DVT study performed 07/08/2016 which was negative. No chest pain no shortness of breath no nausea or vomiting no other associated symptoms no nausea Past Medical History:  Diagnosis Date  . Addiction, opium (HCC)   . Anxiety   . Depression   . Diabetes mellitus without complication (HCC)   . Hypertension   . Morbid obesity [facility])     Patient Active Problem List   Diagnosis Date Noted  . Influenza with pneumonia 06/08/2014  . C. difficile colitis 06/08/2014  . CAP (community acquired pneumonia) 06/07/2014  . Hypokalemia 11/23/2012  . Diabetes type 2 - Diet controlled 11/23/2012  . Morbid obesity (HCC) 11/23/2012  . Low back pain 11/23/2012  . Fungal infection of skin of abdomen 11/23/2012    Past Surgical History:  Procedure Laterality Date  . KNEE SURGERY         Home Medications    Prior to Admission medications   Medication Sig Start Date End Date Taking? Authorizing Provider  albuterol (PROVENTIL HFA;VENTOLIN HFA) 108 (90 BASE) MCG/ACT inhaler Inhale 1-2 puffs into the lungs every 6 (six) hours as needed for wheezing or shortness of breath. 08/23/14   Nelva Nay, MD  cephALEXin (KEFLEX) 500 MG capsule Take 1 capsule (500 mg total) by mouth 4 (four) times daily. 07/08/16   Rise Mu, PA-C  clindamycin (CLEOCIN) 150 MG capsule Take 3 capsules (450 mg total) by mouth 3 (three) times daily. 07/11/16   Lavera Guise, MD  guaiFENesin-codeine 100-10  MG/5ML syrup Take 5 mLs by mouth every 4 (four) hours as needed for cough. 06/06/14   Kristen N Ward, DO  hydrochlorothiazide (HYDRODIURIL) 25 MG tablet Take 25 mg by mouth daily.    Historical Provider, MD  ibuprofen (ADVIL,MOTRIN) 400 MG tablet Take 2 tablets (800 mg total) by mouth every 8 (eight) hours as needed for mild pain. 06/09/14   Calvert Cantor, MD  metFORMIN (GLUCOPHAGE) 1000 MG tablet Take 1,000 mg by mouth 2 (two) times daily with a meal.    Historical Provider, MD  metFORMIN (GLUCOPHAGE) 1000 MG tablet Take 0.5 tablets (500 mg total) by mouth 2 (two) times daily with a meal. 07/11/16   Lavera Guise, MD  methocarbamol (ROBAXIN) 500 MG tablet Take 2 tablets (1,000 mg total) by mouth 4 (four) times daily as needed (Pain). 12/03/14   Nicole Pisciotta, PA-C  olmesartan (BENICAR) 20 MG tablet Take 20 mg by mouth daily.    Historical Provider, MD  Omeprazole (PRILOSEC PO) Take by mouth.    Historical Provider, MD  oxyCODONE-acetaminophen (PERCOCET) 10-325 MG per tablet Take 1 tablet by mouth 4 (four) times daily.    Historical Provider, MD  potassium chloride SA (K-DUR,KLOR-CON) 20 MEQ tablet Take 1 tablet (20 mEq total) by mouth 2 (two) times daily. 07/08/16   Rise Mu, PA-C    Family History Family History  Problem Relation Age of Onset  . Diabetes Mother   . Hypertension  Mother   . Hypertension Father     Social History Social History  Substance Use Topics  . Smoking status: Current Every Day Smoker    Packs/day: 0.50    Types: Cigarettes  . Smokeless tobacco: Never Used  . Alcohol use No     Allergies   Lisinopril   Review of Systems Review of Systems  Constitutional: Negative.   HENT: Negative.   Respiratory: Negative.   Cardiovascular: Negative.   Gastrointestinal: Negative.   Musculoskeletal: Positive for arthralgias and myalgias.       Right foot and ankle pain and right leg pain  Skin: Positive for wound.       Wound at lateral aspect of right foot    Allergic/Immunologic: Positive for immunocompromised state.  Neurological: Negative.   Psychiatric/Behavioral: Negative.   All other systems reviewed and are negative.    Physical Exam Updated Vital Signs BP (!) 127/94 (BP Location: Left Arm)   Pulse 89   Temp 97.8 F (36.6 C) (Oral)   Resp 18   SpO2 94%   Physical Exam  Constitutional: He appears well-developed and well-nourished.  HENT:  Head: Normocephalic and atraumatic.  Eyes: Conjunctivae are normal. Pupils are equal, round, and reactive to light.  Neck: Neck supple. No tracheal deviation present. No thyromegaly present.  Cardiovascular: Normal rate and regular rhythm.   No murmur heard. Pulmonary/Chest: Effort normal and breath sounds normal.  Abdominal: Soft. Bowel sounds are normal. He exhibits no distension. There is no tenderness.  Morbidly obese  Genitourinary: Penis normal.  Musculoskeletal: Normal range of motion. He exhibits no edema or tenderness.  Right lower extremity swollen red and tender at dorsum of foot and distal leg. DP pulse 2+ good capillary refill  Neurological: He is alert. Coordination normal.  Skin: Skin is warm and dry. No rash noted.  Psychiatric: He has a normal mood and affect.  Nursing note and vitals reviewed.    ED Treatments / Results  Labs (all labs ordered are listed, but only abnormal results are displayed) Labs Reviewed  COMPREHENSIVE METABOLIC PANEL - Abnormal; Notable for the following:       Result Value   Sodium 129 (*)    Chloride 90 (*)    Glucose, Bld 392 (*)    Albumin 2.7 (*)    All other components within normal limits  CBC WITH DIFFERENTIAL/PLATELET - Abnormal; Notable for the following:    WBC 11.0 (*)    Monocytes Absolute 1.1 (*)    All other components within normal limits  I-STAT CG4 LACTIC ACID, ED - Abnormal; Notable for the following:    Lactic Acid, Venous 2.19 (*)    All other components within normal limits    EKG  EKG Interpretation None        Radiology No results found.  Procedures Procedures (including critical care time)  Medications Ordered in ED Medications - No data to display  Results for orders placed or performed during the hospital encounter of 07/15/16  Comprehensive metabolic panel  Result Value Ref Range   Sodium 129 (L) 135 - 145 mmol/L   Potassium 4.6 3.5 - 5.1 mmol/L   Chloride 90 (L) 101 - 111 mmol/L   CO2 27 22 - 32 mmol/L   Glucose, Bld 392 (H) 65 - 99 mg/dL   BUN 9 6 - 20 mg/dL   Creatinine, Ser 9.14 0.61 - 1.24 mg/dL   Calcium 9.3 8.9 - 78.2 mg/dL   Total Protein 7.9 6.5 - 8.1  g/dL   Albumin 2.7 (L) 3.5 - 5.0 g/dL   AST 32 15 - 41 U/L   ALT 38 17 - 63 U/L   Alkaline Phosphatase 90 38 - 126 U/L   Total Bilirubin 0.8 0.3 - 1.2 mg/dL   GFR calc non Af Amer >60 >60 mL/min   GFR calc Af Amer >60 >60 mL/min   Anion gap 12 5 - 15  CBC with Differential  Result Value Ref Range   WBC 11.0 (H) 4.0 - 10.5 K/uL   RBC 4.59 4.22 - 5.81 MIL/uL   Hemoglobin 14.4 13.0 - 17.0 g/dL   HCT 16.1 09.6 - 04.5 %   MCV 91.3 78.0 - 100.0 fL   MCH 31.4 26.0 - 34.0 pg   MCHC 34.4 30.0 - 36.0 g/dL   RDW 40.9 81.1 - 91.4 %   Platelets 286 150 - 400 K/uL   Neutrophils Relative % 70 %   Neutro Abs 7.7 1.7 - 7.7 K/uL   Lymphocytes Relative 18 %   Lymphs Abs 2.0 0.7 - 4.0 K/uL   Monocytes Relative 10 %   Monocytes Absolute 1.1 (H) 0.1 - 1.0 K/uL   Eosinophils Relative 2 %   Eosinophils Absolute 0.2 0.0 - 0.7 K/uL   Basophils Relative 0 %   Basophils Absolute 0.0 0.0 - 0.1 K/uL  I-Stat CG4 Lactic Acid, ED  Result Value Ref Range   Lactic Acid, Venous 2.19 (HH) 0.5 - 1.9 mmol/L   Comment NOTIFIED PHYSICIAN    Dg Ankle Complete Right  Result Date: 07/08/2016 CLINICAL DATA:  Pain in the right foot and ankle red and swollen EXAM: RIGHT ANKLE - COMPLETE 3+ VIEW COMPARISON:  12/20/2015 FINDINGS: Old fracture or ossicle adjacent to the dorsal navicular. Moderate plantar calcaneal spur. Chronic deformity of the  fourth metatarsal. No definite acute fracture or malalignment. The ankle mortise is grossly symmetric. IMPRESSION: 1. No definite acute osseous abnormality 2. Old deformity of the fourth metatarsal Electronically Signed   By: Jasmine Pang M.D.   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 No evidence of acute fracture, malalignment or irregularity involving the fifth metatarsal or fifth digit. Mild degenerative changes present in the midfoot at the talonavicular joint. Plantar calcaneal spurring. There is soft tissue swelling about the forefoot. No acute fracture, malalignment or osseous lesion. IMPRESSION: No significant interval change in the appearance of the foot compared to recent imaging from 07/08/2016. 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 Electronically Signed   By: Malachy Moan M.D.   On: 07/11/2016 09:40   Dg Foot Complete Right  Result Date: 07/08/2016 CLINICAL DATA:  Pain with redness and swelling EXAM: RIGHT FOOT COMPLETE - 3+ VIEW COMPARISON:  12/20/2015 FINDINGS: Chronic deformity of the fourth metatarsal. No acute displaced fracture or malalignment. Moderate plantar calcaneal spur. Irregularity at the dorsal navicular, chronic. IMPRESSION: 1. No definite acute osseous abnormality 2. Chronic deformity of the fourth metatarsal Electronically Signed   By: Jasmine Pang M.D.   On: 07/08/2016 03:24   Initial Impression / Assessment and Plan / ED Course  I  have reviewed the triage vital signs and the nursing notes.  Pertinent labs & imaging  results that were available during my care of the patient were reviewed by me and considered in my medical decision making (see chart for details).     Patient does not wish opioid pain medicine as he is a recovered opioid addict.. IV antibiotics ordered. Dr.Saraiya from internal medicine service consulted and will arrange for overnight stay Subcutaneous insulin ordered. Patient has failed outpatient therapy for cellulitis Final Clinical Impressions(s) / ED Diagnoses  Diagnosis cellulitis of right lower extremity #2 hyperglycemia Final diagnoses:  None    New Prescriptions New Prescriptions   No medications on file     Doug Sou, MD 07/15/16 1424

## 2016-07-15 NOTE — ED Notes (Signed)
Heart healthy carb mod tray ordered 

## 2016-07-15 NOTE — ED Triage Notes (Signed)
Pt states that he was seen at his MD and sent here for eval of rt foot cellulitis and needing IV antibiotics. Pt foot swollen and red.

## 2016-07-15 NOTE — H&P (Signed)
Date: 07/15/2016               Patient Name:  Enrigue Hashimi MRN: [id number]  DOB: 26-Dec-1980 Age / Sex: 36 y.o., male   PCP: Penni Bombard, PA         Medical Service: Internal Medicine Teaching Service         Attending Physician: Dr. Lucious Groves, DO    First Contact: Dr. Holley Raring Pager: [phone]  Second Contact: Dr. Burgess Estelle Pager: [phone]       After Hours (After 5p /  First Contact Pager: [phone]  Weekends / Holidays): Second Contact Pager: [phone]   Chief Complaint: Foot ulcer  History of Present Illness: Mr. Larron Armor is a 36 y.o. male with a h/o of DM and HTN who presents with 8 day h/o right foot pain and wound which has failed outpt treatment with keflex and clindamycin.  Pt was first seen in the ED on 4/16 for c/o right foot pain. Patient reported that at that time he developed a headache and went to bed when he woke up his foot was hurting him and he could barely walk. He presented to the emergency department for further evaluation. XR imaging demonstrated chronic 4th met w/ extensive callous formation. Clinical exam was consistent with foot ulceration in the setting of diabetes and poor fitting shoes. He was started on Keflex at that time. He then returned to the ED on 4/20 for the same complaint with mild interval worsening of symptoms and purulent drainage. He was then started on Clindamycin. He was evaluated by his PCP today for his foot pain and again referred to the ED for further evaluation d/t worsening of his pain and swelling. Pt had negative doppler study for DVT on 4/17. He has consistently had elevated BG at 300s and reported no current treatment for DM. Pt had fevers initially, which have resolved and not recurred since. No N/V, chills.  Today patient complains of significant pain in his right foot. There is significant amount of swelling which patient was treated with heat, ice, elevation with limited success. Patient has been unable to  walk on his foot for the last week. Patient takes Suboxone at home for history of opioid abuse and reports that he has been very depressed recently in light of his uncontrolled pain. Patient does have chronic pain in his right knee and foot. He reports that in 2012 he suffered a fall which caused damage to his foot and knee and did not seek medical attention due to his lack of insurance at that time. He has had difficulty with pain in these joints ever since. He is currently followed by Dr. Alphonzo Grieve who is prescribing him Suboxone in addition to his other chronic medications.  In the ED today, pt had a mild WBC to 11, afebrile and HDS. He had hyperglycemia to 392 and psuedohyponatremia to 129. He was given Vancomycin and SQ insulin and IMTS was called for admission d/t failed outpt therapy for diabetic foot ulcer.  Meds: Current Facility-Administered Medications  Medication Dose Route Frequency Provider Last Rate Last Dose  . oxyCODONE-acetaminophen (PERCOCET/ROXICET) 5-325 MG per tablet 1 tablet  1 tablet Oral Q4H PRN Holley Raring, MD       And  . oxyCODONE (Oxy IR/ROXICODONE) immediate release tablet 5 mg  5 mg Oral Q4H PRN Holley Raring, MD       Current Outpatient Prescriptions  Medication Sig Dispense Refill  . cephALEXin (KEFLEX) 500 MG  capsule Take 1 capsule (500 mg total) by mouth 4 (four) times daily. 20 capsule 0  . clindamycin (CLEOCIN) 150 MG capsule Take 3 capsules (450 mg total) by mouth 3 (three) times daily. 118 capsule 0  . gabapentin (NEURONTIN) 300 MG capsule Take 900 mg by mouth 3 (three) times daily.    . hydrochlorothiazide (HYDRODIURIL) 25 MG tablet Take 25 mg by mouth daily.    . metFORMIN (GLUCOPHAGE) 1000 MG tablet Take 1,000 mg by mouth 2 (two) times daily with a meal.    . omeprazole (PRILOSEC) 40 MG capsule Take 40 mg by mouth daily before breakfast.    . sertraline (ZOLOFT) 100 MG tablet Take 100 mg by mouth daily.  0  . ZUBSOLV 5.7-1.4 MG SUBL Place 1 tablet under  the tongue See admin instructions. LUNCH AND IN THE EVENING  0  . ZUBSOLV 8.6-2.1 MG SUBL Place 1 tablet under the tongue every morning.  0  . albuterol (PROVENTIL HFA;VENTOLIN HFA) 108 (90 BASE) MCG/ACT inhaler Inhale 1-2 puffs into the lungs every 6 (six) hours as needed for wheezing or shortness of breath. (Patient not taking: Reported on 07/15/2016) 1 Inhaler 0  . guaiFENesin-codeine 100-10 MG/5ML syrup Take 5 mLs by mouth every 4 (four) hours as needed for cough. (Patient not taking: Reported on 07/15/2016) 120 mL 0  . ibuprofen (ADVIL,MOTRIN) 400 MG tablet Take 2 tablets (800 mg total) by mouth every 8 (eight) hours as needed for mild pain. (Patient not taking: Reported on 07/15/2016)    . metFORMIN (GLUCOPHAGE) 1000 MG tablet Take 0.5 tablets (500 mg total) by mouth 2 (two) times daily with a meal. (Patient not taking: Reported on 07/15/2016) 30 tablet 0  . methocarbamol (ROBAXIN) 500 MG tablet Take 2 tablets (1,000 mg total) by mouth 4 (four) times daily as needed (Pain). (Patient not taking: Reported on 07/15/2016) 20 tablet 0  . potassium chloride SA (K-DUR,KLOR-CON) 20 MEQ tablet Take 1 tablet (20 mEq total) by mouth 2 (two) times daily. (Patient not taking: Reported on 07/15/2016) 6 tablet 0   Allergies: Allergies as of 07/15/2016 - Review Complete 07/15/2016  Allergen Reaction Noted  . Lisinopril Other (See Comments) and Cough 02/15/2014   Past Medical History:  Diagnosis Date  . Addiction, opium ([place])   . Anxiety   . Depression   . Diabetes mellitus without complication ([place])   . Hypertension   . Morbid obesity (HCC)    Family History: Pt family history includes Diabetes in his mother; Hypertension in his father and mother.  Social History: Pt  reports that he has been smoking Cigarettes.  He has been smoking about 0.50 packs per day. He has never used smokeless tobacco. He reports that he does not drink alcohol or use drugs.  Review of Systems: A complete ROS was negative  except as per HPI. Review of Systems  Constitutional: Negative for chills, fever and weight loss.  Eyes: Negative for blurred vision.  Respiratory: Negative for cough and shortness of breath.   Cardiovascular: Negative for chest pain and leg swelling.  Gastrointestinal: Negative for abdominal pain, constipation, diarrhea, nausea and vomiting.  Genitourinary: Negative for dysuria, frequency and urgency.  Musculoskeletal: Positive for joint pain (R knee and foot). Negative for myalgias.  Skin: Negative for rash.  Neurological: Positive for headaches. Negative for dizziness and tremors.  Endo/Heme/Allergies: Negative for polydipsia.  Psychiatric/Behavioral: Positive for depression. The patient is not nervous/anxious.    Physical Exam: Vitals:   07/15/16 1600 07/15/16 1615 07/15/16  1627 07/15/16 1630  BP: 130/76 116/78 116/78 127/87  Pulse: 89 87 81 87  Resp:   18   Temp:      TempSrc:      SpO2: 98% 95% 91% 94%   Physical Exam  Constitutional: He is oriented to person, place, and time. He appears well-developed and well-nourished. He is cooperative. No distress.  HENT:  Head: Normocephalic and atraumatic.  Right Ear: Hearing normal.  Left Ear: Hearing normal.  Nose: Nose normal.  Mouth/Throat: Mucous membranes are normal.  Cardiovascular: Normal rate, regular rhythm, S1 normal, S2 normal and intact distal pulses.  Exam reveals no gallop.   No murmur heard. Pulses:      Dorsalis pedis pulses are 0 on the right side, and 2+ on the left side.       Posterior tibial pulses are 0 on the right side, and 0 on the left side.  Pulmonary/Chest: Effort normal and breath sounds normal. No respiratory distress. He has no wheezes. He has no rhonchi. He has no rales. He exhibits no tenderness.  Abdominal: Soft. Normal appearance and bowel sounds are normal. He exhibits no ascites. There is no hepatosplenomegaly. There is no tenderness.  Musculoskeletal:       Right ankle: He exhibits swelling.  Tenderness.       Feet:  Right foot is grossly swollen, warm to touch and mild erythema w/o streaks. Very TTP. Extends to above the ankle with pain on ROM of ankle joint.  Feet:  Right Foot:  Protective Sensation: 3 sites tested. 1 site sensed. Skin Integrity: Positive for skin breakdown, erythema, warmth and callus.  Left Foot:  Protective Sensation: 3 sites tested. 3 sites sensed.  Neurological: He is alert and oriented to person, place, and time. He has normal strength.  Skin: Skin is warm, dry and intact. He is not diaphoretic.  Psychiatric: He has a normal mood and affect. His speech is normal and behavior is normal.   Labs:  Recent Labs Lab 07/11/16 0931 07/15/16 1239  LATICACIDVEN 1.17 2.19*   CBC:  Recent Labs Lab 07/11/16 0921 07/15/16 1225  WBC 11.5* 11.0*  NEUTROABS 9.0* 7.7  HGB 13.0 14.4  HCT 38.0* 41.9  MCV 92.9 91.3  PLT 196 035   Basic Metabolic Panel:  Recent Labs Lab 07/11/16 0921 07/15/16 1225  NA 128* 129*  K 3.9 4.6  CL 87* 90*  CO2 31 27  GLUCOSE 361* 392*  BUN 13 9  CREATININE 0.74 0.72  CALCIUM 8.8* 9.3   Liver Function Tests:  Recent Labs Lab 07/15/16 1225  AST 32  ALT 38  ALKPHOS 90  BILITOT 0.8  PROT 7.9  ALBUMIN 2.7*   CBG: Lab Results  Component Value Date   HGBA1C 6.9% 11/23/2012   Urinalysis    Component Value Date/Time   COLORURINE ORANGE (A) 07/11/2016 0923   APPEARANCEUR CLEAR 07/11/2016 0923   LABSPEC 1.029 07/11/2016 0923   PHURINE 5.5 07/11/2016 0923   GLUCOSEU >=500 (A) 07/11/2016 0923   HGBUR NEGATIVE 07/11/2016 0923   BILIRUBINUR MODERATE (A) 07/11/2016 0923   KETONESUR 15 (A) 07/11/2016 0923   PROTEINUR NEGATIVE 07/11/2016 0923   UROBILINOGEN 1.0 11/07/2012 2006   NITRITE POSITIVE (A) 07/11/2016 0923   LEUKOCYTESUR NEGATIVE 07/11/2016 0923   Assessment & Plan by Problem: Active Problems:   Type 2 diabetes mellitus ([place])   Morbid obesity (HCC)   Foot ulcer ([place])   Cellulitis  Mr. Chales Pelissier is a 36 y.o. male with  DM and HTN who presents for right foot ulcer which failed outpt therapy.  1) R foot ulcer and cellulitis: Failed keflex and clinda outpt. s/p Vanco in ED. No s/s of systemic infection/sepsis. XR w/o concern for osteo, low clinical suspicion. Has mild LA elevation likely 2/2 dehydration, no clinical concern for sepsis. Will give IVF 1065m bolus. - admit to med-surg for obs - f/u BCx - IVF for LA - continue vancomycin IV, no concern for pseudomonas at this time given appearance of wound w/o ulceration - treat pain with home suboxone + oxycodone [id number]  2) DM: BG elevated to 300s. Started on metformin by ED provider 07/11/16. Last A1c in 2014 was 6.9, will update. - SSI-m, titrate to CBGs - will need outpt therapy, likely continue metformin + additional oral hypoglycemic  3) HTN: Normotensive on admission. Home meds: olmesartan 2047m HCTZ 35m76montinue home meds.  4) h/o opioid abuse: Pt has h/o narcotic abuse and is recovering. Requests pain medications for significant pain in foot, will give home suboxone + breakthrough as above. Control pain with NSAIDs if additional support is needed.  DVT PPx - low molecular weight heparin  Code Status - Full  Dispo: Admit patient to Inpatient with expected length of stay greater than 2 midnights.  Signed: BryaHolley Raring 07/15/2016, 5:01 PM  Pager: [phone]

## 2016-07-15 NOTE — ED Notes (Signed)
Re-ordered [facility]- Service Response claimed they did not have the initial order

## 2016-07-15 NOTE — Progress Notes (Signed)
Pharmacy Antibiotic Note  Brian Day is a 36 y.o. male admitted on 07/15/2016 with cellulitis.  Pharmacy has been consulted for Vancomycin dosing. Pt here with R foot ulcer/cellulitis. Vancomycin dosing will be challenging given obesity (~207 kg). WBC 11. Renal function ok.   Plan: Vancomycin 1750 mg IV q8h Trend WBC, temp, renal function  F/U infectious work-up Drug levels ASAP   Temp (24hrs), Avg:98.4 F (36.9 C), Min:97.8 F (36.6 C), Max:98.9 F (37.2 C)   Recent Labs Lab 07/11/16 0921 07/11/16 0931 07/15/16 1225 07/15/16 1239 07/15/16 1759  WBC 11.5*  --  11.0*  --   --   CREATININE 0.74  --  0.72  --   --   LATICACIDVEN  --  1.17  --  2.19* 1.58    Estimated Creatinine Clearance: 243.2 mL/min (by C-G formula based on SCr of 0.72 mg/dL).    Allergies  Allergen Reactions  . Lisinopril Other (See Comments) and Cough    Made throat dry also   Abran Duke 07/15/2016 11:36 PM

## 2016-07-15 NOTE — ED Notes (Signed)
Admitting MD at bedside at this time to assess pt for admission

## 2016-07-16 ENCOUNTER — Inpatient Hospital Stay (HOSPITAL_COMMUNITY): Payer: Medicaid Other

## 2016-07-16 DIAGNOSIS — G8929 Other chronic pain: Secondary | ICD-10-CM

## 2016-07-16 LAB — GLUCOSE, CAPILLARY
GLUCOSE-CAPILLARY: 332 mg/dL — AB (ref 65–99)
GLUCOSE-CAPILLARY: 212 mg/dL — AB (ref 65–99)
GLUCOSE-CAPILLARY: 264 mg/dL — AB (ref 65–99)
Glucose-Capillary: 196 mg/dL — ABNORMAL HIGH (ref 65–99)
Glucose-Capillary: 255 mg/dL — ABNORMAL HIGH (ref 65–99)
Glucose-Capillary: 319 mg/dL — ABNORMAL HIGH (ref 65–99)
Glucose-Capillary: 332 mg/dL — ABNORMAL HIGH (ref 65–99)
Glucose-Capillary: 333 mg/dL — ABNORMAL HIGH (ref 65–99)

## 2016-07-16 LAB — CBC
HEMATOCRIT: 37.8 % — AB (ref 39.0–52.0)
Hemoglobin: 12.9 g/dL — ABNORMAL LOW (ref 13.0–17.0)
MCH: 31.2 pg (ref 26.0–34.0)
MCHC: 34.1 g/dL (ref 30.0–36.0)
MCV: 91.3 fL (ref 78.0–100.0)
Platelets: 228 10*3/uL (ref 150–400)
RBC: 4.14 MIL/uL — ABNORMAL LOW (ref 4.22–5.81)
RDW: 12.5 % (ref 11.5–15.5)
WBC: 7.9 10*3/uL (ref 4.0–10.5)

## 2016-07-16 LAB — COMPREHENSIVE METABOLIC PANEL
ALT: 36 U/L (ref 17–63)
ANION GAP: 10 (ref 5–15)
AST: 27 U/L (ref 15–41)
Albumin: 2.3 g/dL — ABNORMAL LOW (ref 3.5–5.0)
Alkaline Phosphatase: 70 U/L (ref 38–126)
BILIRUBIN TOTAL: 1.2 mg/dL (ref 0.3–1.2)
BUN: 8 mg/dL (ref 6–20)
CO2: 30 mmol/L (ref 22–32)
Calcium: 8.6 mg/dL — ABNORMAL LOW (ref 8.9–10.3)
Chloride: 93 mmol/L — ABNORMAL LOW (ref 101–111)
Creatinine, Ser: 0.75 mg/dL (ref 0.61–1.24)
Glucose, Bld: 191 mg/dL — ABNORMAL HIGH (ref 65–99)
POTASSIUM: 3.8 mmol/L (ref 3.5–5.1)
Sodium: 133 mmol/L — ABNORMAL LOW (ref 135–145)
TOTAL PROTEIN: 6.5 g/dL (ref 6.5–8.1)

## 2016-07-16 LAB — HEMOGLOBIN A1C
Hgb A1c MFr Bld: 10.8 % — ABNORMAL HIGH (ref 4.8–5.6)
Mean Plasma Glucose: 263 mg/dL

## 2016-07-16 LAB — HIV ANTIBODY (ROUTINE TESTING W REFLEX): HIV SCREEN 4TH GENERATION: NONREACTIVE

## 2016-07-16 MED ORDER — ADULT MULTIVITAMIN W/MINERALS CH
1.0000 | ORAL_TABLET | Freq: Every day | ORAL | Status: DC
  Administered 2016-07-16 – 2016-07-22 (×7): 1 via ORAL
  Filled 2016-07-16 (×7): qty 1

## 2016-07-16 MED ORDER — GADOBENATE DIMEGLUMINE 529 MG/ML IV SOLN
20.0000 mL | Freq: Once | INTRAVENOUS | Status: AC
  Administered 2016-07-16: 20 mL via INTRAVENOUS

## 2016-07-16 MED ORDER — INSULIN DETEMIR 100 UNIT/ML ~~LOC~~ SOLN
15.0000 [IU] | Freq: Two times a day (BID) | SUBCUTANEOUS | Status: DC
  Administered 2016-07-16 (×2): 15 [IU] via SUBCUTANEOUS
  Filled 2016-07-16 (×3): qty 0.15

## 2016-07-16 NOTE — Progress Notes (Signed)
NURSING PROGRESS NOTE  Brian BumgardnerMRN:[id number] Admission Data: 07/15/16 at 2020 Attending Provider: Gust Rung, DO PCP: Gilda Crease, MD Code status: Full  Allergies:  Allergies  Allergen Reactions  . Lisinopril Other (See Comments) and Cough    Made throat dry also   Past Medical History:  Past Medical History:  Diagnosis Date  . Addiction, opium (HCC)   . Anxiety   . Arthritis    "right knee" (07/15/2016)  . CAP (community acquired pneumonia) 05/2014   hx/notes 06/07/2014  . Depression   . GERD (gastroesophageal reflux disease)   . Hypertension   . Migraine    "once q 3-4 years" (07/15/2016)  . Morbid obesity (HCC)   . Type II diabetes mellitus (HCC)    Past Surgical History:  Past Surgical History:  Procedure Laterality Date  . KNEE ARTHROSCOPY Right 2000s   [name] is a 36 y.o. male patient, arrived to floor in room [phone] via stretcher, transferred from ED. Patient alert and oriented X 4. No acute distress noted.Complains of pain 10/10 RLE pain.   Vital signs: Oral temperature 98.9 F (37.2 C), Blood pressure 130/89, Pulse 84, RR 16, SpO2 92 % on room air.   Cardiac monitoring: Telemetry box 5W # 15 in place. Second verified by Glo Herring.,NT  IV access: Left hand-saline locked; condition patent and no redness.  Skin: intact, no pressure ulcer noted in sacral area. Healed wound on outer aspect of Right foot and dark callus noted also on second toe. Second verified by Myrene Buddy and Marcos Eke, RN  Patient's ID armband verified with patient and in place. Information packet given to patient. Fall risk assessed, SR up X2, patient able to verbalize understanding of risks associated with falls and to call nurse or staff to assist before getting out of bed. Patient oriented to room and equipment. Call bell within reach.

## 2016-07-16 NOTE — Progress Notes (Signed)
   Subjective:   No acute events overnight. Pt says that his pain is improved. His erythema and swelling has improved.  We explained the need for MRI.    Objective:  Vital signs in last 24 hours: Vitals:   07/15/16 1745 07/15/16 1815 07/15/16 2018 07/16/16 0500  BP: 113/70 118/71 130/89 122/75  Pulse: 77 84 84 70  Resp:   16 16  Temp:   98.9 F (37.2 C) 98.3 F (36.8 C)  TempSrc:   Oral Oral  SpO2: 97% 96% 92% 95%  Height:   '6\' 3"'$  (1.905 m) '6\' 3"'$  (1.905 m)   General: Vital signs reviewed. Morbidly obese Cardiovascular: rrr, no m/r/g Pulmonary/Chest: Clear to auscultation anteriorly  Abdominal: Soft, non-tender, non-distended, BS + Extremities: right foot swelling, and tenderness, improved from yesterday. No draining site.     Assessment/Plan:  Active Problems:   Uncontrolled type 2 diabetes mellitus with hyperglycemia, without long-term current use of insulin (HCC)   Morbid obesity (HCC)   Diabetic ulcer of right foot (HCC)   Cellulitis of right foot   Benign essential hypertension   Diabetic peripheral neuropathy (HCC)  Right foot ulcer and cellulitis, rule out osteomyelitis: failed clinda and keflex. Cellulitis seems to have improved on vancomycin. Both ESR and CRP are elevated, so we are ordering MRI w and wo contrast of right foot. BCx pending. ABis pending.    -continue Vanc  -follow up MRI  DM: CBGs have been in the 300s  -per diabetes coordinator, added levemir 15 units BID -SSI-M  Chronic pain: -continue home suboxone  -oxycodone 10-325 q4 hours   HTN: normotensive  -continue hctz 25 mg   Dispo: Anticipated discharge in approximately 1-2 day(s).   Burgess Estelle, MD 07/16/2016, 11:24 AM

## 2016-07-16 NOTE — Progress Notes (Signed)
Inpatient Diabetes Program Recommendations  AACE/ADA: New Consensus Statement on Inpatient Glycemic Control (2015)  Target Ranges:  Prepandial:   less than 140 mg/dL      Peak postprandial:   less than 180 mg/dL (1-2 hours)      Critically ill patients:  140 - 180 mg/dL   Lab Results  Component Value Date   GLUCAP 212 (H) 07/16/2016   HGBA1C 10.8 (H) 07/15/2016    Review of Glycemic Control:  Results for JOANDY, BURGET (MRN [id number]) as of 07/16/2016 10:40  Ref. Range 07/15/2016 20:10 07/16/2016 00:11 07/16/2016 04:13 07/16/2016 08:03  Glucose-Capillary Latest Ref Range: 65 - 99 mg/dL 409 (H) 811 (H) 914 (H) 212 (H)    Diabetes history: Type 2 diabetes Outpatient Diabetes medications: Metformin 1000 mg bid Current orders for Inpatient glycemic control:  Novolog moderate q 4 hours  Inpatient Diabetes Program Recommendations:    Note that A1C indicates that average blood sugars approximately 264 mg/dL.  While in the hospital, patient would likely benefit from the addition of basal insulin.  Consider adding Levemir 15 units bid.    Thanks, Beryl Meager, RN, BC-ADM Inpatient Diabetes Coordinator Pager [phone] (8a-5p)

## 2016-07-16 NOTE — Consult Note (Signed)
WOC Nurse wound consult note Reason for Consult: right foot Patient with right leg swelling and foot pain x 2 weeks. DM with patient reported chronic LL swelling to the thigh for many years.  ?lymphedema Palpable pulses bilaterally, no skin color changes  Patient with no evidence of insensate foot, reports more pain in the ankle than at the wound site Wound type:neuropathic foot injury right 5th toe Measurement:calloused area with darkening centrally that is 0.2cm x0.2cm x 0cm Wound bed: intact  Drainage (amount, consistency, odor) none Periwound: intact Dressing procedure/placement/frequency: No topical care needed.   Discussed POC with patient and bedside nurse.  Re consult if needed, will not follow at this time. Thanks  Marleena Shubert [employer], RN,CWOCN, CNS [phone])

## 2016-07-16 NOTE — Progress Notes (Signed)
Initial Nutrition Assessment  DOCUMENTATION CODES:   Morbid obesity  INTERVENTION:   -MVI daily  NUTRITION DIAGNOSIS:   Increased nutrient needs related to wound healing as evidenced by estimated needs.  GOAL:   Patient will meet greater than or equal to 90% of their needs  MONITOR:   PO intake, Supplement acceptance, Diet advancement, Labs, Weight trends, Skin, I & O's  REASON FOR ASSESSMENT:   Consult Wound healing  ASSESSMENT:   [name] is a 36 y.o. male with DM and HTN who presents for right foot ulcer which failed outpt therapy.  Pt admitted with rt foot DM ulcer.   Spoke with pt, who reports generally good appetite. Meal completion 80-100%. Pt shares that his appetite was poor for a few days PTA. He reports that since developing DM ulcer he has put forth more effort in trying to better optimize glycemic control, primarily by taking medications and decreasing sugary beverage intake. He reports it has been a long time since Hgb A1c is checked and is unsure of last reading was. Noted current Hgb A1c pending.   Pt shares he believes he has lost 15# over the past week, however, this is not consistent with wt hx.   Nutrition-Focused physical exam completed. Findings are no fat depletion, no muscle depletion, and moderate edema.   Discussed importance of good PO intake and good glycemic control to assist with wound healing. Pt amenable to MVI.  Outpatient DM medication is 1,000 mg Metformin BID.  Labs reviewed: Na: 133, CBGS: 196-255.   Diet Order:  Diet heart healthy/carb modified Room service appropriate? Yes; Fluid consistency: Thin  Skin:  Wound (see comment) (RLE cellulitis)  Last BM:  07/13/16  Height:   Ht Readings from Last 1 Encounters:  07/16/16  (1.905 m)    Weight:   Wt Readings from Last 1 Encounters:  07/16/16 (!) 456 lb (206.8 kg)    Ideal Body Weight:  89.1 kg  BMI:  Body mass index is 57 kg/m.  Estimated Nutritional  Needs:   Kcal:  2200-2400  Protein:  120-135 grams  Fluid:  >2.2 L  EDUCATION NEEDS:   Education needs addressed  Anica Alcaraz A. Mayford Knife, RD, LDN, CDE Pager: [phone] After hours Pager: [phone]

## 2016-07-17 ENCOUNTER — Inpatient Hospital Stay (HOSPITAL_COMMUNITY): Payer: Medicaid Other

## 2016-07-17 DIAGNOSIS — E1169 Type 2 diabetes mellitus with other specified complication: Principal | ICD-10-CM

## 2016-07-17 DIAGNOSIS — M86171 Other acute osteomyelitis, right ankle and foot: Secondary | ICD-10-CM

## 2016-07-17 DIAGNOSIS — R609 Edema, unspecified: Secondary | ICD-10-CM

## 2016-07-17 DIAGNOSIS — M79609 Pain in unspecified limb: Secondary | ICD-10-CM

## 2016-07-17 LAB — CBC
HEMATOCRIT: 37.5 % — AB (ref 39.0–52.0)
Hemoglobin: 12.6 g/dL — ABNORMAL LOW (ref 13.0–17.0)
MCH: 30.9 pg (ref 26.0–34.0)
MCHC: 33.6 g/dL (ref 30.0–36.0)
MCV: 91.9 fL (ref 78.0–100.0)
PLATELETS: 251 10*3/uL (ref 150–400)
RBC: 4.08 MIL/uL — AB (ref 4.22–5.81)
RDW: 12.5 % (ref 11.5–15.5)
WBC: 7.7 10*3/uL (ref 4.0–10.5)

## 2016-07-17 LAB — GLUCOSE, CAPILLARY
GLUCOSE-CAPILLARY: 280 mg/dL — AB (ref 65–99)
GLUCOSE-CAPILLARY: 313 mg/dL — AB (ref 65–99)
GLUCOSE-CAPILLARY: 228 mg/dL — AB (ref 65–99)
GLUCOSE-CAPILLARY: 227 mg/dL — AB (ref 65–99)
Glucose-Capillary: 200 mg/dL — ABNORMAL HIGH (ref 65–99)
Glucose-Capillary: 326 mg/dL — ABNORMAL HIGH (ref 65–99)

## 2016-07-17 LAB — BASIC METABOLIC PANEL
Anion gap: 11 (ref 5–15)
BUN: 9 mg/dL (ref 6–20)
CO2: 35 mmol/L — ABNORMAL HIGH (ref 22–32)
Calcium: 9.2 mg/dL (ref 8.9–10.3)
Chloride: 88 mmol/L — ABNORMAL LOW (ref 101–111)
Creatinine, Ser: 0.87 mg/dL (ref 0.61–1.24)
GFR calc non Af Amer: >60 mL/min (ref >60)
Glucose, Bld: 210 mg/dL — ABNORMAL HIGH (ref 65–99)
POTASSIUM: 4.2 mmol/L (ref 3.5–5.1)
Sodium: 134 mmol/L — ABNORMAL LOW (ref 135–145)

## 2016-07-17 LAB — VANCOMYCIN, TROUGH: Vancomycin Tr: 15 ug/mL (ref 15–20)

## 2016-07-17 LAB — HEMOGLOBIN A1C
Hgb A1c MFr Bld: 10.9 % — ABNORMAL HIGH (ref 4.8–5.6)
Mean Plasma Glucose: 266 mg/dL

## 2016-07-17 MED ORDER — INSULIN DETEMIR 100 UNIT/ML ~~LOC~~ SOLN
30.0000 [IU] | Freq: Every day | SUBCUTANEOUS | Status: DC
Start: 2016-07-17 — End: 2016-07-18
  Administered 2016-07-17: 30 [IU] via SUBCUTANEOUS
  Filled 2016-07-17: qty 0.3

## 2016-07-17 MED ORDER — KETOROLAC TROMETHAMINE 30 MG/ML IJ SOLN
30.0000 mg | Freq: Once | INTRAMUSCULAR | Status: AC
  Administered 2016-07-17: 30 mg via INTRAVENOUS
  Filled 2016-07-17: qty 1

## 2016-07-17 MED ORDER — INSULIN ASPART 100 UNIT/ML ~~LOC~~ SOLN
8.0000 [IU] | Freq: Three times a day (TID) | SUBCUTANEOUS | Status: DC
  Administered 2016-07-17 – 2016-07-18 (×6): 8 [IU] via SUBCUTANEOUS

## 2016-07-17 MED ORDER — INSULIN DETEMIR 100 UNIT/ML ~~LOC~~ SOLN: 20.0000 [IU] | Freq: Every day | SUBCUTANEOUS | Status: DC

## 2016-07-17 MED ORDER — INSULIN DETEMIR 100 UNIT/ML ~~LOC~~ SOLN: 15.0000 [IU] | Freq: Every day | SUBCUTANEOUS | Status: DC

## 2016-07-17 NOTE — Progress Notes (Signed)
Pharmacy Antibiotic Note  Brian Day is a 36 y.o. male admitted on 07/15/2016 with cellulitis of R foot.  MRI now osteomyelitis as well.  Pharmacy has been consulted for Vancomycin dosing.  Due to patient's young age, renal functino, and morbid obesity, he was initially placed on a q8h Vancomycin regimen.  Trough was checked early in therapy to confirm and was therapeutic.   Plan: Continue Vancomycin  IV every 8 hours. Will need a repeat trough in 48-72 hours to rule out accumulation if therapy continues.  Height:  (190.5 cm) Weight: (!) 456 lb (206.8 kg) (07/11/16) IBW/kg (Calculated) : 84.5  Temp (24hrs), Avg:98.1 F (36.7 C), Min:98 F (36.7 C), Max:98.2 F (36.8 C)   Recent Labs Lab 07/11/16 0921 07/11/16 0931 07/15/16 1225 07/15/16 1239 07/15/16 1759 07/16/16 0632 07/17/16 0621  WBC 11.5*  --  11.0*  --   --  7.9 7.7  CREATININE 0.74  --  0.72  --   --  0.75 0.87  LATICACIDVEN  --  1.17  --  2.19* 1.58  --   --   VANCOTROUGH  --   --   --   --   --   --  15    Estimated Creatinine Clearance: 223.6 mL/min (by C-G formula based on SCr of 0.87 mg/dL).    Allergies  Allergen Reactions  . Lisinopril Other (See Comments) and Cough    Made throat dry also    Antimicrobials this admission: Vanc 4/24 >>  Dose adjustments this admission: 4/26 VT=15, no change  Microbiology results: 4/24 blood x 2 >> ngtd 4/20 urine >> neg F  Thank you for allowing pharmacy to be a part of this patient's care.  [employer], Pharm.D., BCPS Clinical Pharmacist Pager: [phone] Clinical phone for 07/17/2016 from 8:30-4:00 is [phone]. After 4pm, please call Main Rx [phone]) for assistance. 07/17/2016 2:39 PM

## 2016-07-17 NOTE — Progress Notes (Signed)
Inpatient Diabetes Program Recommendations  AACE/ADA: New Consensus Statement on Inpatient Glycemic Control (2015)  Target Ranges:  Prepandial:   less than 140 mg/dL      Peak postprandial:   less than 180 mg/dL (1-2 hours)      Critically ill patients:  140 - 180 mg/dL   Lab Results  Component Value Date   GLUCAP 200 (H) 07/17/2016   HGBA1C 10.9 (H) 07/15/2016    Review of Glycemic Control:  Results for BECKER, CHRISTOPHER (MRN [id number]) as of 07/17/2016 10:26  Ref. Range 07/16/2016 21:17 07/17/2016 00:08 07/17/2016 04:07 07/17/2016 07:58  Glucose-Capillary Latest Ref Range: 65 - 99 mg/dL 409 (H) 811 (H) 914 (H) 200 (H)   Diabetes history: Type 2 diabetes Outpatient Diabetes medications: Metformin 1000 mg bid Current orders for Inpatient glycemic control:  Novolog moderate q 4 hours, Novolog 8 units tid with meals, Levemir 20 units q HS  Inpatient Diabetes Program Recommendations:   Consider increasing Levemir to 40 units q HS (received Levemir 15 units bid yesterday).  Based on A1C, will patient need insulin at discharge?   Will follow.  Thanks,  Beryl Meager, RN, BC-ADM Inpatient Diabetes Coordinator Pager [phone] (8a-5p)

## 2016-07-17 NOTE — Consult Note (Signed)
Reason for Consult:Foot osteomyelitis Referring Physician: [name] is an 36 y.o. male.  HPI: Brian Day was in his usual state of health until about 10d ago when he developed some pain in his right foot. After a few days he sought care in the ED and was given an antibiotic. An ulcer developed after that drained purulence. He returned to the ED and his abx were changed. When he still was not improving he came back to the ED and was admitted. MRI showed a small abscess and osteomyelitis in 3 tarsal bones and the 5th MT. Orthopedic surgery was consulted. He confirms the primary service's evaluation that the swelling, redness, and pain are improved today. Of note, he is diabetic and is not well controlled. He also smokes about 1ppd.  Past Medical History:  Diagnosis Date  . Addiction, opium ([place])   . Anxiety   . Arthritis    "right knee" (07/15/2016)  . CAP (community acquired pneumonia) 05/2014   hx/notes 06/07/2014  . Depression   . GERD (gastroesophageal reflux disease)   . Hypertension   . Migraine    "once q 3-4 years" (07/15/2016)  . Morbid obesity ([place])   . Type II diabetes mellitus ([place])     Past Surgical History:  Procedure Laterality Date  . KNEE ARTHROSCOPY Right 2000s    Family History  Problem Relation Age of Onset  . Diabetes Mother   . Hypertension Mother   . Hypertension Father     Social History:  reports that he has been smoking Cigarettes.  He has a 12.00 pack-year smoking history. He has quit using smokeless tobacco. His smokeless tobacco use included Snuff and Chew. He reports that he does not drink alcohol or use drugs.  Allergies:  Allergies  Allergen Reactions  . Lisinopril Other (See Comments) and Cough    Made throat dry also    Medications: I have reviewed the patient's current medications.  Results for orders placed or performed during the hospital encounter of 07/15/16 (from the past 48 hour(s))  Comprehensive metabolic panel      Status: Abnormal   Collection Time: 07/15/16 12:25 PM  Result Value Ref Range   Sodium 129 (L) 135 - 145 mmol/L   Potassium 4.6 3.5 - 5.1 mmol/L   Chloride 90 (L) 101 - 111 mmol/L   CO2 27 22 - 32 mmol/L   Glucose, Bld 392 (H) 65 - 99 mg/dL   BUN 9 6 - 20 mg/dL   Creatinine, Ser 0.72 0.61 - 1.24 mg/dL   Calcium 9.3 8.9 - 10.3 mg/dL   Total Protein 7.9 6.5 - 8.1 g/dL   Albumin 2.7 (L) 3.5 - 5.0 g/dL   AST 32 15 - 41 U/L   ALT 38 17 - 63 U/L   Alkaline Phosphatase 90 38 - 126 U/L   Total Bilirubin 0.8 0.3 - 1.2 mg/dL   GFR calc non Af Amer >60 >60 mL/min   GFR calc Af Amer >60 >60 mL/min    Comment: (NOTE) The eGFR has been calculated using the CKD EPI equation. This calculation has not been validated in all clinical situations. eGFR's persistently <60 mL/min signify possible Chronic Kidney Disease.    Anion gap 12 5 - 15  CBC with Differential     Status: Abnormal   Collection Time: 07/15/16 12:25 PM  Result Value Ref Range   WBC 11.0 (H) 4.0 - 10.5 K/uL   RBC 4.59 4.22 - 5.81 MIL/uL   Hemoglobin 14.4  13.0 - 17.0 g/dL   HCT 41.9 39.0 - 52.0 %   MCV 91.3 78.0 - 100.0 fL   MCH 31.4 26.0 - 34.0 pg   MCHC 34.4 30.0 - 36.0 g/dL   RDW 12.6 11.5 - 15.5 %   Platelets 286 150 - 400 K/uL   Neutrophils Relative % 70 %   Neutro Abs 7.7 1.7 - 7.7 K/uL   Lymphocytes Relative 18 %   Lymphs Abs 2.0 0.7 - 4.0 K/uL   Monocytes Relative 10 %   Monocytes Absolute 1.1 (H) 0.1 - 1.0 K/uL   Eosinophils Relative 2 %   Eosinophils Absolute 0.2 0.0 - 0.7 K/uL   Basophils Relative 0 %   Basophils Absolute 0.0 0.0 - 0.1 K/uL  I-Stat CG4 Lactic Acid, ED     Status: Abnormal   Collection Time: 07/15/16 12:39 PM  Result Value Ref Range   Lactic Acid, Venous 2.19 (HH) 0.5 - 1.9 mmol/L   Comment NOTIFIED PHYSICIAN   Blood culture (routine x 2)     Status: None (Preliminary result)   Collection Time: 07/15/16  2:02 PM  Result Value Ref Range   Specimen Description BLOOD LEFT HAND    Special  Requests      BOTTLES DRAWN AEROBIC AND ANAEROBIC Blood Culture adequate volume   Culture NO GROWTH 1 DAY    Report Status PENDING   Hemoglobin A1c     Status: Abnormal   Collection Time: 07/15/16  5:39 PM  Result Value Ref Range   Hgb A1c MFr Bld 10.8 (H) 4.8 - 5.6 %    Comment: (NOTE)         Pre-diabetes: 5.7 - 6.4         Diabetes: >6.4         Glycemic control for adults with diabetes: <7.0    Mean Plasma Glucose 263 mg/dL    Comment: (NOTE) Performed At: [facility] [place], [place] [id number] Lindon Romp MD [id number]   Blood culture (routine x 2)     Status: None (Preliminary result)   Collection Time: 07/15/16  5:44 PM  Result Value Ref Range   Specimen Description BLOOD RIGHT ANTECUBITAL    Special Requests      BOTTLES DRAWN AEROBIC AND ANAEROBIC Blood Culture adequate volume   Culture NO GROWTH < 24 HOURS    Report Status PENDING   I-Stat CG4 Lactic Acid, ED     Status: None   Collection Time: 07/15/16  5:59 PM  Result Value Ref Range   Lactic Acid, Venous 1.58 0.5 - 1.9 mmol/L  Glucose, capillary     Status: Abnormal   Collection Time: 07/15/16  8:10 PM  Result Value Ref Range   Glucose-Capillary 332 (H) 65 - 99 mg/dL  Hemoglobin A1c     Status: Abnormal   Collection Time: 07/15/16  9:34 PM  Result Value Ref Range   Hgb A1c MFr Bld 10.9 (H) 4.8 - 5.6 %    Comment: (NOTE)         Pre-diabetes: 5.7 - 6.4         Diabetes: >6.4         Glycemic control for adults with diabetes: <7.0    Mean Plasma Glucose 266 mg/dL    Comment: (NOTE) Performed At: [facility] [address] [place], [place] [id number] Lindon Romp MD [id number]   HIV antibody     Status: None   Collection Time: 07/15/16  9:43  PM  Result Value Ref Range   HIV Screen 4th Generation wRfx Non Reactive Non Reactive    Comment: (NOTE) Performed At: [facility] [place], [place] [id number] Lindon Romp MD  [id number]   Sedimentation rate     Status: Abnormal   Collection Time: 07/15/16  9:43 PM  Result Value Ref Range   Sed Rate 113 (H) 0 - 16 mm/hr  C-reactive protein     Status: Abnormal   Collection Time: 07/15/16  9:43 PM  Result Value Ref Range   CRP 10.6 (H) <1.0 mg/dL  Prealbumin     Status: Abnormal   Collection Time: 07/15/16  9:43 PM  Result Value Ref Range   Prealbumin 6.0 (L) 18 - 38 mg/dL  Glucose, capillary     Status: Abnormal   Collection Time: 07/16/16 12:11 AM  Result Value Ref Range   Glucose-Capillary 255 (H) 65 - 99 mg/dL  Glucose, capillary     Status: Abnormal   Collection Time: 07/16/16  4:13 AM  Result Value Ref Range   Glucose-Capillary 196 (H) 65 - 99 mg/dL  CBC     Status: Abnormal   Collection Time: 07/16/16  6:32 AM  Result Value Ref Range   WBC 7.9 4.0 - 10.5 K/uL   RBC 4.14 (L) 4.22 - 5.81 MIL/uL   Hemoglobin 12.9 (L) 13.0 - 17.0 g/dL   HCT 37.8 (L) 39.0 - 52.0 %   MCV 91.3 78.0 - 100.0 fL   MCH 31.2 26.0 - 34.0 pg   MCHC 34.1 30.0 - 36.0 g/dL   RDW 12.5 11.5 - 15.5 %   Platelets 228 150 - 400 K/uL  Comprehensive metabolic panel     Status: Abnormal   Collection Time: 07/16/16  6:32 AM  Result Value Ref Range   Sodium 133 (L) 135 - 145 mmol/L   Potassium 3.8 3.5 - 5.1 mmol/L   Chloride 93 (L) 101 - 111 mmol/L   CO2 30 22 - 32 mmol/L   Glucose, Bld 191 (H) 65 - 99 mg/dL   BUN 8 6 - 20 mg/dL   Creatinine, Ser 0.75 0.61 - 1.24 mg/dL   Calcium 8.6 (L) 8.9 - 10.3 mg/dL   Total Protein 6.5 6.5 - 8.1 g/dL   Albumin 2.3 (L) 3.5 - 5.0 g/dL   AST 27 15 - 41 U/L   ALT 36 17 - 63 U/L   Alkaline Phosphatase 70 38 - 126 U/L   Total Bilirubin 1.2 0.3 - 1.2 mg/dL   GFR calc non Af Amer >60 >60 mL/min   GFR calc Af Amer >60 >60 mL/min    Comment: (NOTE) The eGFR has been calculated using the CKD EPI equation. This calculation has not been validated in all clinical situations. eGFR's persistently <60 mL/min signify possible Chronic  Kidney Disease.    Anion gap 10 5 - 15  Glucose, capillary     Status: Abnormal   Collection Time: 07/16/16  8:03 AM  Result Value Ref Range   Glucose-Capillary 212 (H) 65 - 99 mg/dL  Glucose, capillary     Status: Abnormal   Collection Time: 07/16/16 11:19 AM  Result Value Ref Range   Glucose-Capillary 333 (H) 65 - 99 mg/dL  Glucose, capillary     Status: Abnormal   Collection Time: 07/16/16  4:32 PM  Result Value Ref Range   Glucose-Capillary 332 (H) 65 - 99 mg/dL  Glucose, capillary     Status: Abnormal   Collection  Time: 07/16/16  8:17 PM  Result Value Ref Range   Glucose-Capillary 264 (H) 65 - 99 mg/dL  Glucose, capillary     Status: Abnormal   Collection Time: 07/16/16  9:17 PM  Result Value Ref Range   Glucose-Capillary 319 (H) 65 - 99 mg/dL  Glucose, capillary     Status: Abnormal   Collection Time: 07/17/16 12:08 AM  Result Value Ref Range   Glucose-Capillary 326 (H) 65 - 99 mg/dL  Glucose, capillary     Status: Abnormal   Collection Time: 07/17/16  4:07 AM  Result Value Ref Range   Glucose-Capillary 280 (H) 65 - 99 mg/dL  CBC     Status: Abnormal   Collection Time: 07/17/16  6:21 AM  Result Value Ref Range   WBC 7.7 4.0 - 10.5 K/uL   RBC 4.08 (L) 4.22 - 5.81 MIL/uL   Hemoglobin 12.6 (L) 13.0 - 17.0 g/dL   HCT 37.5 (L) 39.0 - 52.0 %   MCV 91.9 78.0 - 100.0 fL   MCH 30.9 26.0 - 34.0 pg   MCHC 33.6 30.0 - 36.0 g/dL   RDW 12.5 11.5 - 15.5 %   Platelets 251 150 - 400 K/uL  Basic metabolic panel     Status: Abnormal   Collection Time: 07/17/16  6:21 AM  Result Value Ref Range   Sodium 134 (L) 135 - 145 mmol/L   Potassium 4.2 3.5 - 5.1 mmol/L   Chloride 88 (L) 101 - 111 mmol/L   CO2 35 (H) 22 - 32 mmol/L   Glucose, Bld 210 (H) 65 - 99 mg/dL   BUN 9 6 - 20 mg/dL   Creatinine, Ser 0.87 0.61 - 1.24 mg/dL   Calcium 9.2 8.9 - 10.3 mg/dL   GFR calc non Af Amer >60 >60 mL/min   GFR calc Af Amer >60 >60 mL/min    Comment: (NOTE) The eGFR has been calculated using  the CKD EPI equation. This calculation has not been validated in all clinical situations. eGFR's persistently <60 mL/min signify possible Chronic Kidney Disease.    Anion gap 11 5 - 15  Vancomycin, trough     Status: None   Collection Time: 07/17/16  6:21 AM  Result Value Ref Range   Vancomycin Tr 15 15 - 20 ug/mL  Glucose, capillary     Status: Abnormal   Collection Time: 07/17/16  7:58 AM  Result Value Ref Range   Glucose-Capillary 200 (H) 65 - 99 mg/dL    Mr Foot Right W Wo Contrast  Result Date: 07/16/2016 CLINICAL DATA:  36 year old male complaining of right foot pain and ankle pain radiating to the calf onset 1.5 weeks ago, progressively worsening. Patient has been treated for cellulitis without relief. Fever to 101.2 approximately 8 days ago. EXAM: MRI OF THE RIGHT FOREFOOT WITHOUT AND WITH CONTRAST TECHNIQUE: Multiplanar, multisequence MR imaging of the right foot was performed before and after the administration of intravenous contrast. CONTRAST:  14m MULTIHANCE GADOBENATE DIMEGLUMINE 529 MG/ML IV SOLN COMPARISON:  07/11/2016 right foot radiographs FINDINGS: Bones/Joint/Cartilage Diffuse marrow edema is noted of the base of the second and third metatarsals and fifth metatarsal along its entirety. Marrow signal abnormalities consistent with edema are also identified of the midfoot in particular the cuneiform and cuboid with lesser degree of involvement of the navicular. Upon contrast administration, there are subtle areas of hypointensity involving the middle and lateral cuneiform, anterior plantar aspect of the cuboid and head of the fifth metatarsal concerning for changes of  acute osteomyelitis. 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 Ligaments No frank ligamentous tear. Muscles and Tendons There is tenosynovitis along the peroneal tendons with thickened  appearing peroneal longus consistent with tendinopathy. The extensor and medial tendons crossing the ankle joint appear grossly intact. The Achilles tendon is intact. There is mild thickening and edema along the plantar fascia near its calcaneal insertion. There are small plantar abscesses of the forefoot measuring on the order of 1 x 0.3 cm overlying the peroneal longus tendon, series 10, image 34 at the base of the first and second metatarsals with a 10 x 7 x 14 mm plantar abscess further distal along the plantar aspect of the second metatarsal at midshaft, series 10, image 27 and series 8, image 22. A small cluster of plantar abscesses are also seen just medial to the cuneiform on series 6, image 42 ranging in size from 3 mm to 7 mm. Soft tissues Diffuse soft tissue edema is noted about the visualized forefoot and ankle. IMPRESSION: 1. Marrow signal abnormalities with subtle areas of non enhancement upon contrast administration involving the plantar aspect of the middle and lateral cuneiform, anterior cuboid and head of fifth metatarsal. Findings are concerning for changes of acute osteomyelitis. 2. Small plantar abscesses of the midfoot as above described in the setting of diffuse cellulitis, the largest is 10 x 7 x 14 mm along the plantar aspect of the second metatarsal and midshaft. 3. Tenosynovitis of the peroneal tendons with tendinopathy of the peroneal longus. 4. Slight thickening of the plantar fascia with edema near the calcaneal insertion suggesting a component of fasciitis. 5. 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 Electronically Signed   By: Ashley Royalty M.D.   On: 07/16/2016 20:29    Review of Systems  Constitutional: Positive for fever. Negative for weight loss.  HENT: Negative for ear discharge, ear pain, hearing loss and tinnitus.   Eyes: Negative for blurred vision, double vision, photophobia and pain.  Respiratory: Negative for cough, sputum production and shortness of breath.    Cardiovascular: Negative for chest pain.  Gastrointestinal: Negative for abdominal pain, nausea and vomiting.  Genitourinary: Negative for dysuria, flank pain, frequency and urgency.  Musculoskeletal: Positive for joint pain (Right foot). Negative for back pain, falls, myalgias and neck pain.  Neurological: Positive for headaches. Negative for dizziness, tingling, sensory change, focal weakness and loss of consciousness.  Endo/Heme/Allergies: Does not bruise/bleed easily.  Psychiatric/Behavioral: Negative for depression, memory loss and substance abuse. The patient is not nervous/anxious.    Blood pressure 130/83, pulse 68, temperature 98 F (36.7 C), temperature source Oral, resp. rate 16, height 6' 3"  (1.905 m), weight (!) 206.8 kg (456 lb), SpO2 92 %. Physical Exam  Constitutional: He appears well-developed and well-nourished. No distress.  HENT:  Head: Normocephalic.  Eyes: Conjunctivae are normal. Right eye exhibits no discharge. Left eye exhibits no discharge. No scleral icterus.  Cardiovascular: Normal rate and regular rhythm.   Respiratory: Effort normal. No respiratory distress.  Musculoskeletal:  RLE No ecchymosis or rash, ulcer on lateral 5th MTP joint             Diffuse TTP, especially over achilles, plantar fascia, midfoot  No effusions  Knee stable to varus/ valgus and anterior/posterior stress  Sens DPN, TN paresthetic, SPN intact  Motor EHL, ext, flex, evers 5/5  DP 2+, PT 2+, No significant  edema   LLE No traumatic wounds, ecchymosis, or rash  Nontender  No effusions  Knee stable to varus/ valgus and anterior/posterior stress  Sens DPN, SPN, TN intact  Motor EHL, ext, flex, evers 5/5  DP 2+, PT 2+, No significant edema  Neurological: He is alert.  Skin: Skin is warm and dry. He is not diaphoretic.  Psychiatric: He has a normal mood and affect. His behavior is normal.    Assessment/Plan: Right foot cellulitis, abscess, osteomyelitis of 5th MT, tarsals --  Discussed courses of care available to him at length but in general he could attempt long-term IV abx therapy vs likely transtibial amputation. I told him that any hope he had of keeping his foot (if that's even an option at this point) would hinge on his ability to control his blood sugars and stop smoking. I also told him that if he failed to do that then future similar issues were likely. Dr. Sharol Given is unable to see the patient as an inpatient until Monday. If his primary team feels he is ready for discharge it is fine from an orthopedic standpoint and he can follow up with Dr. Sharol Given in the office (contact information supplied in discharge navigator). He may WBAT on foot. Will consult WOC to aid in wound care. DM -- Advise aggressive control of blood sugar Tobacco abuse -- Advised pt to quit smoking. Will leave to primary team for best approach Obesity -- Advised on weight loss as an adjuct. He would probably benefit from gastric bypass given his diabetic complications; referral would probably be indicated.  Thank you for the opportunity to participate in Brian Day's care.    Lisette Abu, PA-C Orthopedic Surgery [phone] 07/17/2016, 11:37 AM

## 2016-07-17 NOTE — Progress Notes (Addendum)
VASCULAR LAB PRELIMINARY  ARTERIAL  ABI completed:    RIGHT    LEFT    PRESSURE WAVEFORM  PRESSURE WAVEFORM  BRACHIAL 135 Triphasic BRACHIAL 123 Triphasic  DP 174 Trihasic DP 153 Triphasic  PT 160 Triphasic PT 163 Triphasic    RIGHT LEFT  ABI 1.29 1.21   ABIs and Doppler waveforms indicate normal arterial flow bilaterally at rest.  Brian Day, RVS 07/17/2016, 4:35 PM

## 2016-07-17 NOTE — Consult Note (Signed)
WOC consulted 07/16/16, no topical care needed. No open wound, only hyperkeratotic skin  WOC Nurse has reviewed record and this patient has a positive xray or MRI for osteomyelitis, this is considered outside of the scope of practice for the [facility] nurse   Westyn Keatley [facility], CNS [phone]

## 2016-07-17 NOTE — Progress Notes (Addendum)
Subjective:  Pt is feeling improved. Still with some pain in R foot with movement. Swelling improved. No fevers/chills. MRI results relayed and pt understands infection is deeper than initially thought. Awaiting Ortho recs.  Objective:  Vital signs in last 24 hours: Vitals:   07/16/16 1418 07/16/16 1442 07/16/16 2121 07/17/16 0459  BP: (!) 117/56  135/81 130/83  Pulse: 73  82 68  Resp: 20  16 16  Temp: 98.2 F (36.8 C)  98.2 F (36.8 C) 98 F (36.7 C)  TempSrc: Oral  Oral Oral  SpO2: 97%  94% 92%  Weight:  (!) 456 lb (206.8 kg)    Height:  6' 3" (1.905 m)     General: Vital signs reviewed. Morbidly obese Cardiovascular: rrr, no m/r/g Pulmonary/Chest: Clear to auscultation anteriorly  Abdominal: Soft, non-tender, non-distended, BS + Extremities: right foot swelling, and tenderness, improved from yesterday. No draining site.   MRI R Foot: IMPRESSION: 1. Marrow signal abnormalities with subtle areas of non enhancement upon contrast administration involving the plantar aspect of the middle and lateral cuneiform, anterior cuboid and head of fifth metatarsal. Findings are concerning for changes of acute osteomyelitis. 2. Small plantar abscesses of the midfoot as above described in the setting of diffuse cellulitis, the largest is 10 x 7 x 14 mm along the plantar aspect of the second metatarsal and midshaft. 3. Tenosynovitis of the peroneal tendons with tendinopathy of the peroneal longus. 4. Slight thickening of the plantar fascia with edema near the calcaneal insertion suggesting a component of fasciitis. 5. 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  Assessment/Plan:  Active Problems:   Uncontrolled type 2 diabetes mellitus with hyperglycemia, without long-term current use of insulin (HCC)   Morbid obesity (HCC)   Diabetic ulcer of right foot (HCC)   Cellulitis of right foot   Benign essential hypertension   Diabetic peripheral neuropathy (HCC)  Right foot  ulcer and cellulitis with osteomyelitis of : failed clinda and keflex. Improved on vancomycin. ESR elevated. MRI R foot shows concern for osteo + midfoot abcess, concern for tenosynovitus and fascitis, in addition to diffuse cellulitis. - continue Vanc IV - Ortho c/s for eval of midfoot abcess  DM: A1c 10.9. CBGs have been in the 300s  - Increase to levemir 30U qHS + Novolog 8U qAC - SSI-M  Chronic pain: - continue home suboxone  - oxycodone 10-325 q4 hours   Dispo: Anticipated discharge in approximately 1-2 day(s).    , MD 07/17/2016, 7:54 AM   

## 2016-07-18 DIAGNOSIS — M868X7 Other osteomyelitis, ankle and foot: Secondary | ICD-10-CM

## 2016-07-18 DIAGNOSIS — L97518 Non-pressure chronic ulcer of other part of right foot with other specified severity: Secondary | ICD-10-CM

## 2016-07-18 DIAGNOSIS — Z79891 Long term (current) use of opiate analgesic: Secondary | ICD-10-CM

## 2016-07-18 DIAGNOSIS — L03115 Cellulitis of right lower limb: Secondary | ICD-10-CM | POA: Diagnosis present

## 2016-07-18 DIAGNOSIS — Z794 Long term (current) use of insulin: Secondary | ICD-10-CM

## 2016-07-18 DIAGNOSIS — E1165 Type 2 diabetes mellitus with hyperglycemia: Secondary | ICD-10-CM

## 2016-07-18 LAB — GLUCOSE, CAPILLARY
GLUCOSE-CAPILLARY: 190 mg/dL — AB (ref 65–99)
GLUCOSE-CAPILLARY: 207 mg/dL — AB (ref 65–99)
GLUCOSE-CAPILLARY: 242 mg/dL — AB (ref 65–99)
GLUCOSE-CAPILLARY: 248 mg/dL — AB (ref 65–99)
GLUCOSE-CAPILLARY: 253 mg/dL — AB (ref 65–99)
Glucose-Capillary: 185 mg/dL — ABNORMAL HIGH (ref 65–99)
Glucose-Capillary: 253 mg/dL — ABNORMAL HIGH (ref 65–99)

## 2016-07-18 MED ORDER — INSULIN DETEMIR 100 UNIT/ML ~~LOC~~ SOLN
40.0000 [IU] | Freq: Every day | SUBCUTANEOUS | Status: DC
  Administered 2016-07-18 – 2016-07-19 (×2): 40 [IU] via SUBCUTANEOUS
  Filled 2016-07-18 (×2): qty 0.4

## 2016-07-18 MED ORDER — IBUPROFEN 600 MG PO TABS
600.0000 mg | ORAL_TABLET | Freq: Four times a day (QID) | ORAL | Status: DC | PRN
  Administered 2016-07-18 – 2016-07-20 (×2): 600 mg via ORAL
  Filled 2016-07-18 (×2): qty 1

## 2016-07-18 NOTE — Progress Notes (Signed)
Inpatient Diabetes Program Recommendations  AACE/ADA: New Consensus Statement on Inpatient Glycemic Control (2015)  Target Ranges:  Prepandial:   less than 140 mg/dL      Peak postprandial:   less than 180 mg/dL (1-2 hours)      Critically ill patients:  140 - 180 mg/dL   Spoke with patient about diabetes and home regimen for diabetes control. Patient reports that he is followed by a PCP for DM management but patient reports physician is not aggressive managing him. Patient reports being diagnosed about 3-4 years ago. Patient had knowledge of A1c.  Discussed A1C results (10.9% on 07/15/16). Discussed glucose and A1C goals. Discussed importance of checking CBGs and maintaining good CBG control to prevent long-term and short-term complications. Explained how hyperglycemia leads to damage within blood vessels which lead to the common complications seen with uncontrolled diabetes. Stressed to the patient the importance of improving glycemic control to prevent further complications from uncontrolled diabetes. Discussed impact of nutrition, exercise, stress, sickness, and medications on diabetes control.Patient has plans to have bariatric surgery after his acute problems are resolved. Discussed the Levemir, Novolog, 70/30 insulins. Patient expects to have Medicaid coverage within 2-3 weeks and will then be able to afford Levemir and Novolog. Patient will watch DM educational videos including the insulin administration instructional videos. Patient verbalized understanding of information discussed and he states that he has no further questions at this time related to diabetes.   Thanks,  Christena Deem RN, MSN, [facility] Inpatient Diabetes Coordinator Team Pager [phone] (8a-5p)

## 2016-07-18 NOTE — Progress Notes (Signed)
   Subjective:  Pt is feeling improved. Has had time to think about ortho recs for BKA and is willing to proceed if that is the recommended course. Swelling is improved, still responding to IV vanco well. Able to ambulate some with assistive device yesterday.  Objective:  Vital signs in last 24 hours: Vitals:   07/17/16 1456 07/17/16 2016 07/18/16 0521 07/18/16 0526  BP: 125/83 120/65 (!) 68/38 115/68  Pulse: 74 76 72 67  Resp: Temp: 99.2 F (37.3 C) 98.5 F (36.9 C) 97.9 F (36.6 C)   TempSrc: Oral     SpO2: 96% 96% 91% 94%  Weight:      Height:       General: Vital signs reviewed. Morbidly obese Cardiovascular: rrr, no m/r/g Pulmonary/Chest: Clear to auscultation anteriorly  Abdominal: Soft, non-tender, non-distended, BS + Extremities: right foot swelling, and tenderness, improved from yesterday. No draining site.   Assessment/Plan:  Active Problems:   Uncontrolled type 2 diabetes mellitus with hyperglycemia, without long-term current use of insulin (HCC)   Morbid obesity (HCC)   Diabetic ulcer of right foot (HCC)   Cellulitis of right foot   Benign essential hypertension   Diabetic peripheral neuropathy (HCC)   Cellulitis of right lower extremity  Right foot ulcer and cellulitis with osteomyelitis of : Improved on vancomycin. Ortho recs likely need for BKA. Dr. Lajoyce Corners to see on 07/21/16. - continue Vanc IV  DM: A1c 10.9. CBGs still 200s. Will plan to switch to 70/30 once total daily requirement for control determined - Increase to levemir 40U qHS + Novolog 8U qAC - SSI-m qAC/HS  Chronic pain: - continue home suboxone  - oxycodone 10-325 q4 hours   Dispo: Anticipated discharge in approximately 3-4 day(s).   Carolynn Comment, MD 07/18/2016, 11:00 AM

## 2016-07-19 LAB — GLUCOSE, CAPILLARY
GLUCOSE-CAPILLARY: 194 mg/dL — AB (ref 65–99)
GLUCOSE-CAPILLARY: 255 mg/dL — AB (ref 65–99)
Glucose-Capillary: 210 mg/dL — ABNORMAL HIGH (ref 65–99)
Glucose-Capillary: 157 mg/dL — ABNORMAL HIGH (ref 65–99)
Glucose-Capillary: 220 mg/dL — ABNORMAL HIGH (ref 65–99)

## 2016-07-19 MED ORDER — INSULIN ASPART 100 UNIT/ML ~~LOC~~ SOLN
10.0000 [IU] | Freq: Three times a day (TID) | SUBCUTANEOUS | Status: DC
  Administered 2016-07-19 (×2): 10 [IU] via SUBCUTANEOUS

## 2016-07-19 MED ORDER — INSULIN ASPART 100 UNIT/ML ~~LOC~~ SOLN
12.0000 [IU] | Freq: Three times a day (TID) | SUBCUTANEOUS | Status: DC
  Administered 2016-07-19 – 2016-07-21 (×5): 12 [IU] via SUBCUTANEOUS

## 2016-07-19 MED ORDER — INSULIN ASPART 100 UNIT/ML ~~LOC~~ SOLN
0.0000 [IU] | Freq: Three times a day (TID) | SUBCUTANEOUS | Status: DC
  Administered 2016-07-19 (×2): 4 [IU] via SUBCUTANEOUS
  Administered 2016-07-19: 11 [IU] via SUBCUTANEOUS

## 2016-07-19 NOTE — Progress Notes (Signed)
   Subjective:  Pt is feeling well. No complaints. Pain under control. Asks what time Dr. Lajoyce Corners will be by on Monday because he wants his mother to be here.  Objective:  Vital signs in last 24 hours: Vitals:   07/18/16 0526 07/18/16 1618 07/18/16 2018 07/19/16 0405  BP: 115/68 133/76 (!) 112/53 123/65  Pulse: 67 68 64 66  Resp:  Temp:  98.4 F (36.9 C) 97.8 F (36.6 C) 98.5 F (36.9 C)  TempSrc:      SpO2: 94% 98% 96% 96%  Weight:      Height:       General: Vital signs reviewed. Morbidly obese Cardiovascular: rrr, no m/r/g Pulmonary/Chest: Clear to auscultation anteriorly  Abdominal: Soft, non-tender, non-distended, BS + Extremities: right foot swelling, and tenderness, improved from yesterday. No draining site.   Assessment/Plan:  Active Problems:   Uncontrolled type 2 diabetes mellitus with hyperglycemia, without long-term current use of insulin (HCC)   Morbid obesity (HCC)   Diabetic ulcer of right foot (HCC)   Cellulitis and abscess of right lower extremity   Benign essential hypertension   Diabetic peripheral neuropathy (HCC)  Right foot ulcer and cellulitis with osteomyelitis: Improving. On Vanc. Dr. Lajoyce Corners to see on 07/21/16, suspect need for BKA. - continue Vanc IV  DM: A1c 10.9. CBGs still 200s. Will plan to switch to 70/30 once total daily requirement for control determined - Increase to levemir 40U qHS + Novolog 8U qAC - SSI-m qAC/HS  Chronic pain: - continue home suboxone  - oxycodone 10-325 q4 hours  - ibuprofen  q6h PRN  Dispo: Anticipated discharge after 3-4 days.   Carolynn Comment, MD 07/19/2016, 7:37 AM

## 2016-07-20 LAB — BASIC METABOLIC PANEL
Anion gap: 11 (ref 5–15)
BUN: 7 mg/dL (ref 6–20)
CHLORIDE: 94 mmol/L — AB (ref 101–111)
CO2: 28 mmol/L (ref 22–32)
CREATININE: 0.66 mg/dL (ref 0.61–1.24)
Calcium: 8.7 mg/dL — ABNORMAL LOW (ref 8.9–10.3)
GFR calc non Af Amer: >60 mL/min (ref >60)
Glucose, Bld: 259 mg/dL — ABNORMAL HIGH (ref 65–99)
POTASSIUM: 3.9 mmol/L (ref 3.5–5.1)
Sodium: 133 mmol/L — ABNORMAL LOW (ref 135–145)

## 2016-07-20 LAB — CULTURE, BLOOD (ROUTINE X 2)
CULTURE: NO GROWTH
Culture: NO GROWTH
Special Requests: ADEQUATE
Special Requests: ADEQUATE

## 2016-07-20 LAB — GLUCOSE, CAPILLARY
GLUCOSE-CAPILLARY: 320 mg/dL — AB (ref 65–99)
GLUCOSE-CAPILLARY: 234 mg/dL — AB (ref 65–99)
GLUCOSE-CAPILLARY: 261 mg/dL — AB (ref 65–99)
GLUCOSE-CAPILLARY: 163 mg/dL — AB (ref 65–99)
Glucose-Capillary: 188 mg/dL — ABNORMAL HIGH (ref 65–99)
Glucose-Capillary: 251 mg/dL — ABNORMAL HIGH (ref 65–99)

## 2016-07-20 MED ORDER — OXYCODONE HCL 5 MG PO TABS
5.0000 mg | ORAL_TABLET | ORAL | Status: DC | PRN
  Administered 2016-07-20 – 2016-07-22 (×10): 5 mg via ORAL
  Filled 2016-07-20 (×10): qty 1

## 2016-07-20 MED ORDER — OXYCODONE HCL 5 MG PO TABS: 5.0000 mg | ORAL_TABLET | Freq: Three times a day (TID) | ORAL | Status: DC | PRN

## 2016-07-20 MED ORDER — INSULIN ASPART 100 UNIT/ML ~~LOC~~ SOLN
0.0000 [IU] | Freq: Three times a day (TID) | SUBCUTANEOUS | Status: DC
  Administered 2016-07-20: 7 [IU] via SUBCUTANEOUS
  Administered 2016-07-20: 11 [IU] via SUBCUTANEOUS
  Administered 2016-07-21 (×3): 7 [IU] via SUBCUTANEOUS
  Administered 2016-07-22: 4 [IU] via SUBCUTANEOUS
  Administered 2016-07-22: 11 [IU] via SUBCUTANEOUS

## 2016-07-20 MED ORDER — OXYCODONE HCL 5 MG PO TABS
5.0000 mg | ORAL_TABLET | Freq: Four times a day (QID) | ORAL | Status: DC | PRN
  Administered 2016-07-20: 5 mg via ORAL
  Filled 2016-07-20: qty 1

## 2016-07-20 MED ORDER — OXYCODONE-ACETAMINOPHEN 5-325 MG PO TABS
1.0000 | ORAL_TABLET | ORAL | Status: DC | PRN
  Administered 2016-07-20 – 2016-07-22 (×10): 1 via ORAL
  Filled 2016-07-20 (×10): qty 1

## 2016-07-20 MED ORDER — INSULIN ASPART 100 UNIT/ML ~~LOC~~ SOLN
0.0000 [IU] | SUBCUTANEOUS | Status: DC
  Administered 2016-07-20: 15 [IU] via SUBCUTANEOUS
  Administered 2016-07-20: 4 [IU] via SUBCUTANEOUS

## 2016-07-20 MED ORDER — INSULIN DETEMIR 100 UNIT/ML ~~LOC~~ SOLN
50.0000 [IU] | Freq: Every day | SUBCUTANEOUS | Status: DC
  Administered 2016-07-20: 50 [IU] via SUBCUTANEOUS
  Filled 2016-07-20: qty 0.5

## 2016-07-20 MED ORDER — INSULIN ASPART 100 UNIT/ML ~~LOC~~ SOLN: 0.0000 [IU] | Freq: Every day | SUBCUTANEOUS | Status: DC

## 2016-07-20 MED ORDER — OXYCODONE-ACETAMINOPHEN 5-325 MG PO TABS
1.0000 | ORAL_TABLET | Freq: Four times a day (QID) | ORAL | Status: DC | PRN
  Administered 2016-07-20: 1 via ORAL
  Filled 2016-07-20: qty 1

## 2016-07-20 MED ORDER — OXYCODONE-ACETAMINOPHEN 5-325 MG PO TABS: 1.0000 | ORAL_TABLET | Freq: Three times a day (TID) | ORAL | Status: DC | PRN

## 2016-07-20 NOTE — Progress Notes (Addendum)
Inpatient Diabetes Program Recommendations  AACE/ADA: New Consensus Statement on Inpatient Glycemic Control (2015)  Target Ranges:  Prepandial:   less than 140 mg/dL      Peak postprandial:   less than 180 mg/dL (1-2 hours)      Critically ill patients:  140 - 180 mg/dL   Review of Glycemic Control  Diabetes history: DM 2 Outpatient Diabetes medications: Metformin Current orders for Inpatient glycemic control: Levemir 40, Novolog Resistant Q4 hours, Novolog 12 units TID meal coverage.  Inpatient Diabetes Program Recommendations:    Fasting glucose in labs 300's this am, consider increasing Levemir again instead of Novolog, to Levemir 50 units to overall lower glucose trends. Patient received a total of 55 unit of Novolog yesterday. Would leave meal coverage the same with current dose.  Thanks,  Christena Deem RN, MSN, [facility] Inpatient Diabetes Coordinator Team Pager [phone] (8a-5p)

## 2016-07-20 NOTE — Progress Notes (Signed)
Notified MD about patient pain not being controlled after MD adjusted frequency of pain medication. Madelin Rear, MSN, RN, [employer]

## 2016-07-20 NOTE — Progress Notes (Addendum)
   Subjective:  Pt is feeling well. No complaints. Looking forward to discussing definitive treatment options with Dr. Lajoyce Corners tomorrow.  Objective:  Vital signs in last 24 hours: Vitals:   07/19/16 0405 07/19/16 1536 07/19/16 2036 07/20/16 0433  BP: 123/65 115/65 113/83 118/63  Pulse: 66 69 77 71  Resp: Temp: 98.5 F (36.9 C) 98 F (36.7 C) 97.7 F (36.5 C) 98.2 F (36.8 C)  TempSrc:   Oral Oral  SpO2: 96% 100% 96% 95%  Weight:      Height:       General: Vital signs reviewed. Morbidly obese Cardiovascular: rrr, no m/r/g Pulmonary/Chest: Clear to auscultation anteriorly  Abdominal: Soft, non-tender, non-distended, BS + Extremities: right foot swelling, and tenderness, improved from yesterday. No draining site.   Assessment/Plan:  Active Problems:   Uncontrolled type 2 diabetes mellitus with hyperglycemia, without long-term current use of insulin (HCC)   Morbid obesity (HCC)   Diabetic ulcer of right foot (HCC)   Cellulitis and abscess of right lower extremity   Benign essential hypertension   Diabetic peripheral neuropathy (HCC)  Right foot ulcer and cellulitis with osteomyelitis: Improving. On Vanc. Dr. Lajoyce Corners to see on 07/21/16, suspect need for BKA. - continue Vanc IV  DM: A1c 10.9. CBGs still 200-300. Will plan to switch to 70/30 once total daily requirement for control determined. - Increase to levemir 50U qHS + Novolog 12U qAC - SSI-r qAC/HS  Chronic pain: - continue home suboxone  - oxycodone 10-325 q4 hours  - ibuprofen  q6h PRN  Dispo: Anticipated discharge after 2-3 days.   Carolynn Comment, MD 07/20/2016, 7:43 AM

## 2016-07-21 DIAGNOSIS — M1A0710 Idiopathic chronic gout, right ankle and foot, without tophus (tophi): Secondary | ICD-10-CM

## 2016-07-21 DIAGNOSIS — E11628 Type 2 diabetes mellitus with other skin complications: Secondary | ICD-10-CM

## 2016-07-21 DIAGNOSIS — G8921 Chronic pain due to trauma: Secondary | ICD-10-CM

## 2016-07-21 DIAGNOSIS — M25571 Pain in right ankle and joints of right foot: Secondary | ICD-10-CM

## 2016-07-21 DIAGNOSIS — L97501 Non-pressure chronic ulcer of other part of unspecified foot limited to breakdown of skin: Secondary | ICD-10-CM

## 2016-07-21 DIAGNOSIS — L03115 Cellulitis of right lower limb: Secondary | ICD-10-CM

## 2016-07-21 LAB — GLUCOSE, CAPILLARY
GLUCOSE-CAPILLARY: 234 mg/dL — AB (ref 65–99)
GLUCOSE-CAPILLARY: 198 mg/dL — AB (ref 65–99)
Glucose-Capillary: 199 mg/dL — ABNORMAL HIGH (ref 65–99)
Glucose-Capillary: 204 mg/dL — ABNORMAL HIGH (ref 65–99)
Glucose-Capillary: 219 mg/dL — ABNORMAL HIGH (ref 65–99)
Glucose-Capillary: 237 mg/dL — ABNORMAL HIGH (ref 65–99)

## 2016-07-21 LAB — URIC ACID: URIC ACID, SERUM: 3.2 mg/dL — AB (ref 4.4–7.6)

## 2016-07-21 LAB — VANCOMYCIN, TROUGH: VANCOMYCIN TR: 25 ug/mL — AB (ref 15–20)

## 2016-07-21 MED ORDER — INSULIN ASPART 100 UNIT/ML ~~LOC~~ SOLN
20.0000 [IU] | Freq: Three times a day (TID) | SUBCUTANEOUS | Status: DC
  Administered 2016-07-21 – 2016-07-22 (×4): 20 [IU] via SUBCUTANEOUS

## 2016-07-21 MED ORDER — COLCHICINE 0.6 MG PO TABS
0.6000 mg | ORAL_TABLET | Freq: Two times a day (BID) | ORAL | Status: DC
  Administered 2016-07-21 – 2016-07-22 (×3): 0.6 mg via ORAL
  Filled 2016-07-21 (×3): qty 1

## 2016-07-21 MED ORDER — VANCOMYCIN HCL 10 G IV SOLR
1500.0000 mg | Freq: Three times a day (TID) | INTRAVENOUS | Status: DC
  Filled 2016-07-21: qty 1500

## 2016-07-21 MED ORDER — DOXYCYCLINE HYCLATE 100 MG PO TABS
100.0000 mg | ORAL_TABLET | Freq: Two times a day (BID) | ORAL | Status: DC
  Administered 2016-07-21 – 2016-07-22 (×3): 100 mg via ORAL
  Filled 2016-07-21 (×3): qty 1

## 2016-07-21 MED ORDER — INSULIN DETEMIR 100 UNIT/ML ~~LOC~~ SOLN
60.0000 [IU] | Freq: Every day | SUBCUTANEOUS | Status: DC
  Administered 2016-07-21: 60 [IU] via SUBCUTANEOUS
  Filled 2016-07-21 (×2): qty 0.6

## 2016-07-21 NOTE — Consult Note (Signed)
ORTHOPAEDIC CONSULTATION  REQUESTING PHYSICIAN: Gust Rung, DO  Chief Complaint: Global pain right foot and ankle with weightbearing. Patient complains of swelling and redness globallyAround the foot and ankle. Denies any fever or chills denies any purulent draining abscess.   HPI: Brian Day is a 36 y.o. male who presents with Several week history of pain and swelling right foot. Patient states the pain is worse with weightbearing. Patient states with weightbearing the foot swells and the ankle and foot turns red. Patient complains of pain with weightbearing or with range of motion of the ankle joint and the metatarsal heads. Past Medical History:  Diagnosis Date  . Addiction, opium (HCC)   . Anxiety   . Arthritis    "right knee" (07/15/2016)  . CAP (community acquired pneumonia) 05/2014   hx/notes 06/07/2014  . Depression   . GERD (gastroesophageal reflux disease)   . Hypertension   . Migraine    "once q 3-4 years" (07/15/2016)  . Morbid obesity (HCC)   . Type II diabetes mellitus (HCC)    Past Surgical History:  Procedure Laterality Date  . KNEE ARTHROSCOPY Right 2000s   Social History   Social History  . Marital status: Single    Spouse name: N/A  . Number of children: N/A  . Years of education: N/A   Social History Main Topics  . Smoking status: Current Every Day Smoker    Packs/day: 1.00    Years: 12.00    Types: Cigarettes  . Smokeless tobacco: Former Neurosurgeon    Types: Snuff, Chew     Comment: 07/15/2016 "quit chew/snuff in my early 20s"  . Alcohol use No  . Drug use: No     Comment: 07/15/2016 "nothing since 05/27/2016; was abusing pain pills"  . Sexual activity: No   Other Topics Concern  . None   Social History Narrative  . None   Family History  Problem Relation Age of Onset  . Diabetes Mother   . Hypertension Mother   . Hypertension Father    - negative except otherwise stated in the family history section Allergies  Allergen Reactions    . Lisinopril Other (See Comments) and Cough    Made throat dry also   Prior to Admission medications   Medication Sig Start Date End Date Taking? Authorizing Provider  cephALEXin (KEFLEX) 500 MG capsule Take 1 capsule (500 mg total) by mouth 4 (four) times daily. 07/08/16  Yes Rise Mu, PA-C  clindamycin (CLEOCIN) 150 MG capsule Take 3 capsules (450 mg total) by mouth 3 (three) times daily. 07/11/16  Yes Lavera Guise, MD  gabapentin (NEURONTIN) 300 MG capsule Take 900 mg by mouth 3 (three) times daily. 11/14/13  Yes Historical Provider, MD  hydrochlorothiazide (HYDRODIURIL) 25 MG tablet Take 25 mg by mouth daily.   Yes Historical Provider, MD  metFORMIN (GLUCOPHAGE) 1000 MG tablet Take 1,000 mg by mouth 2 (two) times daily with a meal.   Yes Historical Provider, MD  omeprazole (PRILOSEC) 40 MG capsule Take 40 mg by mouth daily before breakfast. 11/26/13  Yes Historical Provider, MD  sertraline (ZOLOFT) 100 MG tablet Take 100 mg by mouth daily. 05/26/16  Yes Historical Provider, MD  ZUBSOLV 5.7-1.4 MG SUBL Place 1 tablet under the tongue See admin instructions. LUNCH AND IN THE EVENING 06/16/16  Yes Historical Provider, MD  ZUBSOLV 8.6-2.1 MG SUBL Place 1 tablet under the tongue every morning. 06/16/16  Yes Historical Provider, MD  albuterol (PROVENTIL HFA;VENTOLIN HFA)  108 (90 BASE) MCG/ACT inhaler Inhale 1-2 puffs into the lungs every 6 (six) hours as needed for wheezing or shortness of breath. Patient not taking: Reported on 07/15/2016 08/23/14   Nelva Nay, MD  guaiFENesin-codeine 100-10 MG/5ML syrup Take 5 mLs by mouth every 4 (four) hours as needed for cough. Patient not taking: Reported on 07/15/2016 06/06/14   Kristen N Ward, DO  ibuprofen (ADVIL,MOTRIN) 400 MG tablet Take 2 tablets (800 mg total) by mouth every 8 (eight) hours as needed for mild pain. Patient not taking: Reported on 07/15/2016 06/09/14   Calvert Cantor, MD  metFORMIN (GLUCOPHAGE) 1000 MG tablet Take 0.5 tablets (500 mg  total) by mouth 2 (two) times daily with a meal. Patient not taking: Reported on 07/15/2016 07/11/16   Lavera Guise, MD  methocarbamol (ROBAXIN) 500 MG tablet Take 2 tablets (1,000 mg total) by mouth 4 (four) times daily as needed (Pain). Patient not taking: Reported on 07/15/2016 12/03/14   Joni Reining Pisciotta, PA-C  potassium chloride SA (K-DUR,KLOR-CON) 20 MEQ tablet Take 1 tablet (20 mEq total) by mouth 2 (two) times daily. Patient not taking: Reported on 07/15/2016 07/08/16   Rise Mu, PA-C   No results found. - pertinent xrays, CT, MRI studies were reviewed and independently interpreted  Positive ROS: All other systems have been reviewed and were otherwise negative with the exception of those mentioned in the HPI and as above.  Physical Exam: General: Alert, no acute distress Psychiatric: Patient is competent for consent with normal mood and affect Lymphatic: No axillary or cervical lymphadenopathy Cardiovascular: No pedal edema Respiratory: No cyanosis, no use of accessory musculature GI: No organomegaly, abdomen is soft and non-tender  Skin:  patient does have some swelling of the right lower extremity there is no cellulitis no draining ulcers is a very small superficial ulcer over the lateral border of the little toe but there is no drainage no tenderness to palpation.    Neurologic: Patient does not have protective sensation bilateral lower extremities.   MUSCULOSKEexamination patient has a good dorsalis pedis pulse bilaterally. Patient ABI shows good triphasic flow. Patient has exquisite pain to palpation across the ankle joint and has pain to palpation across the metatarsal heads. There is no redness no cellulitis no open ulcers or blisters. There is a very small superficial ulcer over the lateral border of the little toe. There is no sausage digit swelling no clinical signs of chronic infection. The patient's radiographs and MRI scan was reviewed. There is some increased  uptake in the midfoot bones but this is an area the patient is asymptomatic. There is some areas of periarticular fluid collections which could be consistent with gout.  Assessment:  diabetic insensate neuropathy with right foot pain swelling and redness most likely consistent with gout possible osteomyelitis.  Plan: We will obtain a uric acid level will start the patient on colchicine I will follow-up in the morning continue IV antibiotic   Aldean Baker, MD [facility] [phone] 7:05 AM

## 2016-07-21 NOTE — Progress Notes (Signed)
   Subjective:  Pt is feeling okay. Swelling and erythema of right foot completely resolved. Still with significant TTP over R ankle joint and pain with wt bearing and ambulation. D/w Dr. Lajoyce Corners and agreeable to evaluate for gout.  Objective:  Vital signs in last 24 hours: Vitals:   07/20/16 0433 07/20/16 1512 07/20/16 2102 07/21/16 0423  BP: 118/63 (!) 123/51 123/62 (!) 122/51  Pulse: 71 65 66 62  Resp: Temp: 98.2 F (36.8 C) 98.7 F (37.1 C) 98.7 F (37.1 C) 97.9 F (36.6 C)  TempSrc: Oral  Oral Oral  SpO2: 95% 96% 97% 96%  Weight:      Height:       General: Vital signs reviewed. Morbidly obese Cardiovascular: rrr, no m/r/g Pulmonary/Chest: Clear to auscultation anteriorly  Abdominal: Soft, non-tender, non-distended, BS + Extremities: right foot w/ severely decreased sensation, small superficial ulceration of lateral 5th digit, no swelling/erythema of soft tissues  Assessment/Plan:  Active Problems:   Uncontrolled type 2 diabetes mellitus with hyperglycemia, without long-term current use of insulin (HCC)   Morbid obesity (HCC)   Diabetic ulcer of right foot (HCC)   Cellulitis and abscess of right lower extremity   Benign essential hypertension   Diabetic peripheral neuropathy (HCC)   Idiopathic chronic gout of right ankle without tophus  Right foot cellulitis: Resolved on Vanc. Ortho feels continued pain and MRI findings may be more consistent with acute gout vs osteo. Will discontinue Vanc IV now that cellulitis is resolved, give IV holiday. - start Doxy PO  BID  MRI findings right foot: initially read with concern for acute osteomyelitis and abscesses, Ortho evaluation today is not convinced and recommends evaluation for acute gout. Empiric colchicine. - uric acid level - colchicine - Ortho c/s, appreciate recs  DM: A1c 10.9. CBGs still 200-300. - Increase to levemir 60U qHS + Novolog 20U qAC - SSI-r qAC/HS  Chronic pain: - continue home  suboxone  - oxycodone 10-325 q4 hours  - ibuprofen  q6h PRN  Dispo: Anticipated discharge after 1-2 days.   Carolynn Comment, MD 07/21/2016, 7:46 AM

## 2016-07-21 NOTE — Progress Notes (Signed)
Per MD order, IV was D/C. Will continue to monitor.

## 2016-07-22 ENCOUNTER — Telehealth: Payer: Self-pay | Admitting: Internal Medicine

## 2016-07-22 ENCOUNTER — Telehealth: Payer: Self-pay

## 2016-07-22 ENCOUNTER — Telehealth (INDEPENDENT_AMBULATORY_CARE_PROVIDER_SITE_OTHER): Payer: Self-pay | Admitting: Orthopedic Surgery

## 2016-07-22 LAB — GLUCOSE, CAPILLARY
GLUCOSE-CAPILLARY: 182 mg/dL — AB (ref 65–99)
GLUCOSE-CAPILLARY: 268 mg/dL — AB (ref 65–99)
Glucose-Capillary: 324 mg/dL — ABNORMAL HIGH (ref 65–99)

## 2016-07-22 LAB — BASIC METABOLIC PANEL
ANION GAP: 10 (ref 5–15)
BUN: 11 mg/dL (ref 6–20)
CHLORIDE: 95 mmol/L — AB (ref 101–111)
CO2: 29 mmol/L (ref 22–32)
Calcium: 8.9 mg/dL (ref 8.9–10.3)
Creatinine, Ser: 0.71 mg/dL (ref 0.61–1.24)
GFR calc Af Amer: >60 mL/min (ref >60)
Glucose, Bld: 189 mg/dL — ABNORMAL HIGH (ref 65–99)
POTASSIUM: 3.7 mmol/L (ref 3.5–5.1)
SODIUM: 134 mmol/L — AB (ref 135–145)

## 2016-07-22 MED ORDER — INSULIN STARTER KIT- SYRINGES (ENGLISH): 1.0000 | Freq: Once | 0 refills | Status: AC

## 2016-07-22 MED ORDER — BLOOD GLUCOSE METER KIT: PACK | 0 refills | Status: DC

## 2016-07-22 MED ORDER — INSULIN STARTER KIT- SYRINGES (ENGLISH)
1.0000 | Freq: Once | Status: AC
  Administered 2016-07-22: 1
  Filled 2016-07-22: qty 1

## 2016-07-22 MED ORDER — DOXYCYCLINE HYCLATE 100 MG PO TABS: 100.0000 mg | ORAL_TABLET | Freq: Two times a day (BID) | ORAL | 0 refills | Status: DC

## 2016-07-22 MED ORDER — COLCHICINE 0.6 MG PO TABS: 0.6000 mg | ORAL_TABLET | Freq: Two times a day (BID) | ORAL | 0 refills | Status: DC

## 2016-07-22 MED ORDER — "INSULIN SYRINGE-NEEDLE U-100 31G X 15/64"" 1 ML MISC": 0 refills | Status: DC

## 2016-07-22 MED ORDER — DOXYCYCLINE HYCLATE 100 MG PO TABS
100.0000 mg | ORAL_TABLET | Freq: Two times a day (BID) | ORAL | 0 refills | Status: DC
Start: 2016-07-22 — End: 2016-07-23

## 2016-07-22 MED ORDER — COLCHICINE 0.6 MG PO CAPS: 0.6000 mg | ORAL_CAPSULE | Freq: Two times a day (BID) | ORAL | 1 refills | Status: DC | PRN

## 2016-07-22 MED ORDER — INSULIN NPH ISOPHANE & REGULAR (70-30) 100 UNIT/ML ~~LOC~~ SUSP: 60.0000 [IU] | Freq: Two times a day (BID) | SUBCUTANEOUS | 11 refills | Status: DC

## 2016-07-22 NOTE — Progress Notes (Addendum)
Patient ID: Brian Day, male   DOB: 1980-08-23, 36 y.o.   MRN: [id number] Patient's uric acid was 3.2. He has received 2 doses of colchicine. On examination patient's foot is significantly better. He has no pain to palpation around the ankle no pain with range of motion of the ankle and has no pain to palpation across the metatarsal heads. These were exquisitely painful yesterday morning. Patient's foot still has no signs or symptoms of infection. There is no sausage digit swelling of the toescellulitis no blisters.  I feel the patient did have acute gout. Would recommend discharging on colchicine 0.6 mg twice a day and 2 weeks of doxycycline 100 mg twice a day. Orders are written. I will follow-up in the office next week.

## 2016-07-22 NOTE — Progress Notes (Signed)
Pt/ family refusing to leave wants pain medication prescription.

## 2016-07-22 NOTE — Progress Notes (Signed)
Inpatient Diabetes Program Recommendations  AACE/ADA: New Consensus Statement on Inpatient Glycemic Control (2015)  Target Ranges:  Prepandial:   less than 140 mg/dL      Peak postprandial:   less than 180 mg/dL (1-2 hours)      Critically ill patients:  140 - 180 mg/dL   Lab Results  Component Value Date   GLUCAP 324 (H) 07/22/2016   HGBA1C 10.9 (H) 07/15/2016    Review of Glycemic Control  Spoke with pt regarding his glycemic control and going home on insulin. Pt states he has a terrific headache at present and doesn't feel ready to go home now. Pt states his Mom can give his insulin, and this Diabetes Coordinator explained importance of pt taking ownership of his diabetes which includes monitoring his blood sugars and taking insulin and meds as prescribed. Pt to give insulin injection at lunch today. Discussed hypoglycemia s/s and treatment. Pt to f/u with PCP and take logbook for review and MD to make needed adjustments. Discussed diet and exercise and how they affect blood sugar control. Pt states he will need to work on portion control. Will order OP Diabetes Education consult for uncontrolled DM.  Discussed above with RN.  Pt to be discharged on Novolin 70/30 60 units bid. To purchase [employer] meter and supplies.  Thank you. Ailene Ards, RD, LDN, CDE Inpatient Diabetes Coordinator [phone]

## 2016-07-22 NOTE — Discharge Summary (Signed)
Name: Brian Day MRN: [id number] DOB: [birth year] 36 y.o. PCP: Javier Docker, MD  Date of Admission: 07/15/2016 12:50 PM Date of Discharge: 07/22/2016 Attending Physician: Bartholomew Crews, MD  Discharge Diagnosis: Principal Problem:   Cellulitis and abscess of right lower extremity Active Problems:   Uncontrolled type 2 diabetes mellitus with hyperglycemia, without long-term current use of insulin (HCC)   Morbid obesity ([place])   Diabetic ulcer of right foot (HCC)   Benign essential hypertension   Diabetic peripheral neuropathy (HCC)   Idiopathic chronic gout of right ankle without tophus   Discharge Medications: Allergies as of 07/22/2016      Reactions   Lisinopril Other (See Comments), Cough   Made throat dry also      Medication List    STOP taking these medications   albuterol 108 (90 Base) MCG/ACT inhaler Commonly known as:  PROVENTIL HFA;VENTOLIN HFA   cephALEXin 500 MG capsule Commonly known as:  KEFLEX   clindamycin 150 MG capsule Commonly known as:  CLEOCIN   gabapentin 300 MG capsule Commonly known as:  NEURONTIN   guaiFENesin-codeine 100-10 MG/5ML syrup   hydrochlorothiazide 25 MG tablet Commonly known as:  HYDRODIURIL   ibuprofen 400 MG tablet Commonly known as:  ADVIL,MOTRIN   methocarbamol 500 MG tablet Commonly known as:  ROBAXIN   omeprazole 40 MG capsule Commonly known as:  PRILOSEC   potassium chloride SA 20 MEQ tablet Commonly known as:  K-DUR,KLOR-CON   sertraline 100 MG tablet Commonly known as:  ZOLOFT     TAKE these medications   blood glucose meter kit and supplies Dispense based on patient and insurance preference. Use up to four times daily as directed. (FOR ICD-9 250.00, 250.01).   Colchicine 0.6 MG Caps Take 0.6 mg by mouth 2 (two) times daily as needed.   colchicine 0.6 MG tablet Take 1 tablet (0.6 mg total) by mouth 2 (two) times daily.   doxycycline 100 MG tablet Commonly known as:  VIBRA-TABS Take 1  tablet (100 mg total) by mouth 2 (two) times daily.   doxycycline 100 MG tablet Commonly known as:  VIBRA-TABS Take 1 tablet (100 mg total) by mouth every 12 (twelve) hours.   insulin NPH-regular Human (70-30) 100 UNIT/ML injection Commonly known as:  NOVOLIN 70/30 Inject 60 Units into the skin 2 (two) times daily with a meal.   Insulin Syringe-Needle U-100 31G X 15/64" 1 ML Misc The patient is insulin requiring, ICD 10 code E10.9. The patient tests 4 times per day.   metFORMIN 1000 MG tablet Commonly known as:  GLUCOPHAGE Take 1,000 mg by mouth 2 (two) times daily with a meal. What changed:  Another medication with the same name was removed. Continue taking this medication, and follow the directions you see here.   ZUBSOLV 5.7-1.4 MG Subl Generic drug:  Buprenorphine HCl-Naloxone HCl Place 1 tablet under the tongue See admin instructions. LUNCH AND IN THE EVENING   ZUBSOLV 8.6-2.1 MG Subl Generic drug:  Buprenorphine HCl-Naloxone HCl Place 1 tablet under the tongue every morning.            Durable Medical Equipment        Start     Ordered   07/22/16 1143  For home use only DME Walker  Once    Question:  Patient needs a walker to treat with the following condition  Answer:  Foot pain, right   07/22/16 1143      Disposition and follow-up:   [name]  was discharged from [facility] in Good condition.  At the hospital follow up visit please address:  1.  DM: New start insulin, assess BG, education, insulin administration. Gout: Assess symptoms, continued response to colchicine, f/u w/ Ortho. Cellulitis/Osteo: Assess recurrence of infection, worsening of symptoms, compliance with Abx, f/u with Ortho. Consider repeat MRI for repeat evaluation in 6-8 weeks.  2.  Labs / imaging needed at time of follow-up: consider ESR and MRI R foot for monitoring of osteo  Follow-up Appointments: Follow-up Information    Newt Minion, MD. Schedule an  appointment as soon as possible for a visit in 1 week(s).   Specialty:  Orthopedic Surgery Contact information: [place] [address] [phone]        [place]. Go on 07/29/2016.   Why:  Apptointment at 10:15am for hospital follow up and to establish care. Contact information: [address] [place] [facility] Hospital Course by problem list: Principal Problem:   Cellulitis and abscess of right lower extremity Active Problems:   Uncontrolled type 2 diabetes mellitus with hyperglycemia, without long-term current use of insulin (HCC)   Morbid obesity (HCC)   Diabetic ulcer of right foot (HCC)   Benign essential hypertension   Diabetic peripheral neuropathy (HCC)   Idiopathic chronic gout of right ankle without tophus   1. 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 He was seen in the emergency department twice prior to her admission and was treated with oral antibiotics which initially had some small response followed by deterioration. Patient was evaluated with MRI on admission which showed concern for osteomyelitis of the fifth metatarsal head and some concern for small abscesses throughout the midfoot. It also showed significant cellulitic changes in the soft tissues of the foot extending up past the ankle. Patient was started on vancomycin IV given his failed oral antibiotic therapy previously. Patient had excellent initial response with significant resolution of pain, swelling and erythema in the first 24 hours. Orthopedic surgery was consulted for findings of osteo on MRI. Patient could not be evaluated for several days, and by the time he was seen initial  cellulitic process had completely resolved. Patient was still complaining of significant pain and tenderness over the ankle joint and MTPs of the right foot which orthopedics thought was consistent with acute gout. The MRI was reviewed and while chronic osteomyelitis was possible, there interpretation was thought to be consistent with gouty arthropathy as well. Orthopedic also noted some concern for Charcot arthropathy in the midfoot. Ultimate recommendations were for continued oral antibiotic therapy with doxycycline for 2 weeks, and follow-up with orthopedics for further evaluation and management. Given the patient's MRI findings suggestive of chronic osteo-we would recommend reevaluation and potentially following ESR and repeat MRI in 6-8 weeks for evaluation progression.  2. Acute gout: Following resolution of the patient's acute cellulitis, patient was evaluated by orthopedics who felt that his continued pain in the right ankle and MTP joints was consistent with acute gout. Patient was started empirically on colchicine and after 2 doses 24 hours the patient's pain was significantly improved. Whether recommended that he continue his colchicine for 2 weeks and should follow up with orthopedics at  that time. This is the patient's first gout attack, uric acid level obtained was not elevated at 3.2. Patient may not benefit from allopurinol at this time but continues follow-up if gouty symptoms return. No evidence of tophi presently.  3. Diabetes: Patient carried a history of diabetes mellitus, was currently untreated. He presented with significantly elevated blood glucose in the 400s. Patient was started on insulin after A1c was found to be 10.9. Insulin was titrated and he was transitioned to 7030 Novolin at 60 units twice a day. He was also encouraged to take metformin titrating up to 1000 mg twice a day for insulin sensitizing. Many discussions were had with the patient on diet and lifestyle modifications  to address his diabetes and control his blood sugars. Patient is morbidly obese and is aware that his obesity is contributing to his condition. Patient is also aware of the significant risk for diabetes presents to his long-term health as well as increased risk for infections as the one he presented with this hospitalization. Patient will continue to work on his diabetes care with [facility].  Discharge Vitals:   BP 138/74 (BP Location: Left Arm)   Pulse 66   Temp 98 F (36.7 C) (Oral)   Resp 18   Ht 6' 3"  (1.905 m)   Wt (!) 456 lb (206.8 kg) Comment: 07/11/16  SpO2 98%   BMI 57.00 kg/m   Pertinent Labs, Studies, and Procedures: As above.  Procedures Performed:  Dg Ankle Complete Right  Result Date: 07/08/2016 CLINICAL DATA:  Pain in the right foot and ankle red and swollen EXAM: RIGHT ANKLE - COMPLETE 3+ VIEW COMPARISON:  12/20/2015 FINDINGS: Old fracture or ossicle adjacent to the dorsal navicular. Moderate plantar calcaneal spur. Chronic deformity of the fourth metatarsal. No definite acute fracture or malalignment. The ankle mortise is grossly symmetric. IMPRESSION: 1. No definite acute osseous abnormality 2. Old deformity of the fourth metatarsal Electronically Signed   By: Donavan Foil M.D.   On: 07/08/2016 03:23   Mr Foot Right W Wo Contrast  Result Date: 07/16/2016 CLINICAL DATA:  36 year old male complaining of right foot pain and ankle pain radiating to the calf onset 1.5 weeks ago, progressively worsening. Patient has been treated for cellulitis without relief. Fever to 101.2 approximately 8 days ago. EXAM: MRI OF THE RIGHT FOREFOOT WITHOUT AND WITH CONTRAST TECHNIQUE: Multiplanar, multisequence MR imaging of the right foot was performed before and after the administration of intravenous contrast. CONTRAST:  44m MULTIHANCE GADOBENATE DIMEGLUMINE 529 MG/ML IV SOLN COMPARISON:  07/11/2016 right foot radiographs FINDINGS: Bones/Joint/Cartilage Diffuse marrow  edema is noted of the base of the second and third metatarsals and fifth metatarsal along its entirety. Marrow signal abnormalities consistent with edema are also identified of the midfoot in particular the cuneiform and cuboid with lesser degree of involvement of the navicular. Upon contrast administration, there are subtle areas of hypointensity involving the middle and lateral cuneiform, anterior plantar aspect of the cuboid and head of the fifth metatarsal concerning for changes of acute osteomyelitis. 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 Ligaments No frank ligamentous tear. Muscles and Tendons There is tenosynovitis along the peroneal tendons with thickened appearing peroneal longus consistent with tendinopathy. The extensor and medial tendons crossing the ankle joint appear grossly intact. The Achilles tendon is intact. There is mild thickening and edema along the plantar  fascia near its calcaneal insertion. There are small plantar abscesses of the forefoot measuring on the order of 1 x 0.3 cm overlying the peroneal longus tendon, series 10, image 34 at the base of the first and second metatarsals with a 10 x 7 x 14 mm plantar abscess further distal along the plantar aspect of the second metatarsal at midshaft, series 10, image 27 and series 8, image 22. A small cluster of plantar abscesses are also seen just medial to the cuneiform on series 6, image 42 ranging in size from 3 mm to 7 mm. Soft tissues Diffuse soft tissue edema is noted about the visualized forefoot and ankle. IMPRESSION: 1. Marrow signal abnormalities with subtle areas of non enhancement upon contrast administration involving the plantar aspect of the middle and lateral cuneiform, anterior cuboid and head of fifth metatarsal. Findings are concerning for changes of acute osteomyelitis. 2. Small plantar  abscesses of the midfoot as above described in the setting of diffuse cellulitis, the largest is 10 x 7 x 14 mm along the plantar aspect of the second metatarsal and midshaft. 3. Tenosynovitis of the peroneal tendons with tendinopathy of the peroneal longus. 4. Slight thickening of the plantar fascia with edema near the calcaneal insertion suggesting a component of fasciitis. 5. 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 Electronically Signed   By: Ashley Royalty M.D.   On: 07/16/2016 20:29   Dg Foot Complete Right  Result Date: 07/11/2016 CLINICAL DATA:  36 year old male with diabetes and right lateral foot pain for the past 2 weeks EXAM: RIGHT FOOT COMPLETE - 3+ VIEW COMPARISON:  Recent prior radiographs of the right foot and ankle performed at [facility] on 07/08/2016 FINDINGS: 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 No evidence of acute fracture, malalignment or irregularity involving the fifth metatarsal or fifth digit. Mild degenerative changes present in the midfoot at the talonavicular joint. Plantar calcaneal spurring. There is soft tissue swelling about the forefoot. No acute fracture, malalignment or osseous lesion. IMPRESSION: No significant interval change in the appearance of the foot compared to recent imaging from 07/08/2016. 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 Electronically Signed   By: Jacqulynn Cadet M.D.   On: 07/11/2016 09:40   Dg Foot Complete Right  Result Date: 07/08/2016 CLINICAL DATA:  Pain with redness and swelling EXAM: RIGHT FOOT COMPLETE - 3+ VIEW COMPARISON:  12/20/2015 FINDINGS: Chronic deformity of the fourth metatarsal. No acute displaced fracture or malalignment. Moderate plantar calcaneal spur. Irregularity at the dorsal navicular, chronic. IMPRESSION: 1. No definite acute osseous abnormality 2. Chronic deformity of the  fourth metatarsal Electronically Signed   By: Donavan Foil M.D.   On: 07/08/2016 03:24   Consults: Dr. Sharol Given, Orthopaedics  Discharge Instructions: Discharge Instructions    Call MD for:  persistant nausea and vomiting    Complete by:  As directed    Call MD for:  severe uncontrolled pain    Complete by:  As directed    Call MD for:  temperature >100.4    Complete by:  As directed    Diet - low sodium heart healthy    Complete by:  As directed    Discharge instructions    Complete by:  As directed    You had an infection in your right foot which we have treated with antibiotics. Please continue to take Doxycycline two times every day for the next  two weeks to continue to treat this infection. You pain was also likely affected by an attack of gout, which is an inflammatory reaction to crystals which form in your foot joints. We have treated this with a medication called colchicine, please continue to take this medication two times every day until you follow up with the Orthopaedic surgeon, Dr. Sharol Given.  You have diabetes and we found that you blood sugar was very high when you were admitted. This poses serious risks to your health including damage to your blood vessels which can cause kidney damage, blindness, heart attacks and strokes. It can also increase your risk for infections like you had during this hospitalization. We have started you on insulin to control your diabetes. Please take 60U of the Novolin 70/30 insulin two times every day and remember to check your blood sugars every morning and before each meal and bedtime. You should also continue to take the Metformin medication two times every day for your diabetes. We have scheduled an appointment for you in the [facility] on the first floor of the hospital to continue to work on your diabetes.  When you return home, please restart taking your home Suboxone medication around 6-8 hours after your last dose of Oxycodone you  received in the hospital. You may start to feel slightly bad from withdrawal with symptoms of sweating, anxiety, and nausea which are indicators to take your dose of Suboxone. You will meet Dr. Daryll Drown at your clinic follow up visit who can continue to work with you on your opioid dependence.   Increase activity slowly    Complete by:  As directed       Signed: Holley Raring, MD 07/22/2016, 11:44 AM   Pager: [phone]

## 2016-07-22 NOTE — Telephone Encounter (Signed)
Pt mom Beth called to advise she cannot afford the medication he was prescribed (cultizine) and wants to know if there's anything else you can give him less expensive.  [phone]

## 2016-07-22 NOTE — Telephone Encounter (Signed)
Mother called to report that the cost of colchicine is 300.00 requesting new medication be called in. Advised her to call prescriber to change medication because patient has not be seem by [facility]

## 2016-07-22 NOTE — Progress Notes (Signed)
Brian Day to be D/C'd Home per MD order.  Discussed with the patient and all questions fully answered.  VSS, Skin clean, dry and intact without evidence of skin break down, no evidence of skin tears noted. IV catheter discontinued intact. Site without signs and symptoms of complications. Dressing and pressure applied.  An After Visit Summary was printed and given to the patient. Patient received prescriptions.  D/c education completed with patient/family including follow up instructions, medication list, d/c activities limitations if indicated, with other d/c instructions as indicated by MD - patient able to verbalize understanding, all questions fully answered. Pt demonstrated well how to check own BG and give himself insulin.    Patient instructed to return to ED, call 911, or call MD for any changes in condition.   Patient escorted via WC, and D/C home via private auto.  [name] 07/22/2016 3:12 PM

## 2016-07-22 NOTE — Telephone Encounter (Signed)
Hospital TOC per dr Marinda Elk, discharge 07/22/2016, appt 07/29/2016.

## 2016-07-22 NOTE — Telephone Encounter (Signed)
   Reason for call:   I received a call from Mr. Macio Kissoon at 907 PM. When I called 15 minutes later, his mother picked up the phone and provided the information below.   Pertinent Data:   Since discharge, he has left-sided abdominal cramping, nausea, and vomiting.  He is able to hold down liquids but not solids.   Gas-X has not been helpful.  He does not feel dizzy upon standing or moving around.  She recalls his only new medication is colchicine, and his CBG was in the 200s prior to my call.   Assessment / Plan / Recommendations:   I reassured her that he may be reacting to this medication but will touch base with his team of doctors who are more familiar with him tomorrow morning. She also felt the medication cost a lot.   I offered to call in a prescription anti-emetic, but she preferred something OTC and agreed on Pepto-Bismo.   As always, pt is advised that if symptoms worsen or new symptoms arise, they should go to an urgent care facility or to to ER for further evaluation.   Beather Arbour, MD   07/22/2016, 9:34 PM

## 2016-07-22 NOTE — Progress Notes (Signed)
   Subjective:  Pt is feeling improved in terms of R foot pain. Ankle tenderness and MTP pain improved. Swelling completely resolved, no redness. Good response to colchicine. To have Ortho f/u w/ Dr. Lajoyce Corners. Still with pain on wt bearing, but able to ambulate w/ assistance.  Objective:  Vital signs in last 24 hours: Vitals:   07/21/16 0423 07/21/16 1400 07/21/16 2222 07/22/16 0507  BP: (!) 122/51 138/83 (!) 144/85 138/74  Pulse: 62 73 86 66  Resp: Temp: 97.9 F (36.6 C) 98 F (36.7 C) 98.2 F (36.8 C) 98 F (36.7 C)  TempSrc: Oral Oral Oral Oral  SpO2: 96% 97% 97% 98%  Weight:      Height:       General: Vital signs reviewed. Morbidly obese Cardiovascular: rrr, no m/r/g Pulmonary/Chest: Clear to auscultation anteriorly  Abdominal: Soft, non-tender, non-distended, BS + Extremities: right foot w/ severely decreased sensation, small superficial ulceration of lateral 5th digit, no swelling/erythema of soft tissues  Assessment/Plan:  Principal Problem:   Cellulitis and abscess of right lower extremity Active Problems:   Uncontrolled type 2 diabetes mellitus with hyperglycemia, without long-term current use of insulin (HCC)   Morbid obesity (HCC)   Diabetic ulcer of right foot (HCC)   Benign essential hypertension   Diabetic peripheral neuropathy (HCC)   Idiopathic chronic gout of right ankle without tophus  Right foot cellulitis/chronic osteomyelitis: Swelling erythema resolved on Vanc. Ortho feels continued pain and MRI findings may be more consistent with acute gout. MRI suggests chronic osteo in 5th metatarsal head, would benefit from continued Abx therapy. - continue Doxy PO  BID x 2 wks  Acute gout: Improved pain and TTP after colchicine. Ortho recs continuation. Will have f/u arranged in several weeks.  DM: A1c 10.9. CBGs improving to <200 w/ ~120U total daily dose. Switch to Novolin 70/30 insuline 60U BID for discharge.  Chronic pain: - restart home  suboxone 6-8hrs after last Oxy after discharge.  - oxycodone 10-325 q4 hours  - ibuprofen  q6h PRN  Dispo: Anticipated discharge today.  Carolynn Comment, MD 07/22/2016, 11:08 AM

## 2016-07-23 ENCOUNTER — Other Ambulatory Visit: Payer: Self-pay | Admitting: Internal Medicine

## 2016-07-23 MED ORDER — SULFAMETHOXAZOLE-TRIMETHOPRIM 400-80 MG PO TABS: 2.0000 | ORAL_TABLET | Freq: Two times a day (BID) | ORAL | 0 refills | Status: AC

## 2016-07-23 MED ORDER — IBUPROFEN 200 MG PO TABS: 600.0000 mg | ORAL_TABLET | Freq: Four times a day (QID) | ORAL | 0 refills | Status: DC | PRN

## 2016-07-23 NOTE — Progress Notes (Signed)
  Reason for call:   I placed an outgoing call to [name] at 7:30 AM regarding follow up after discharge (see Dr. Eliane Decree phone note from 07/22/16). His mother answered the phone.   Assessment/ Plan:   Mother reported that pt was feeling better after taking Pepto-Bismol last night. No further vomitting or diarrhea. Complained of significant gas and belching prior to Pepto, but somewhat improved.  I agree with Dr. Allena Katz that symptoms are likely the result of colchicine side-effects. Pt was unable to afford colchicine Rx, and I recommended pt should take  ibuprofen q6h with food instead, which should avoid GI upset and be more affordable.  Pt was able to start taking Suboxone yesterday evening and reportedly only minor symptoms of opioid withdrawal including some sweats.  Will f/u as previously schedule in clinic next week.  As always, pt is advised that if symptoms worsen or new symptoms arise, they should go to an urgent care facility or to to ER for further evaluation.   Carolynn Comment, MD   07/23/2016, 7:55 AM

## 2016-07-23 NOTE — Telephone Encounter (Signed)
I called and advised pt's mother that I left samples for her at the front desk. The cost to the pt was over 100$ and I advised that it was roughly 80$ at [facility] and this is still too expensive. I advised I would leave what samples that we had at the front desk. Lets see how this works and will reassess at follow up appt.

## 2016-07-24 ENCOUNTER — Ambulatory Visit (INDEPENDENT_AMBULATORY_CARE_PROVIDER_SITE_OTHER): Payer: Self-pay | Admitting: Orthopedic Surgery

## 2016-07-24 ENCOUNTER — Encounter (INDEPENDENT_AMBULATORY_CARE_PROVIDER_SITE_OTHER): Payer: Self-pay | Admitting: Orthopedic Surgery

## 2016-07-24 DIAGNOSIS — M1A0710 Idiopathic chronic gout, right ankle and foot, without tophus (tophi): Secondary | ICD-10-CM

## 2016-07-24 MED ORDER — METHYLPREDNISOLONE ACETATE 40 MG/ML IJ SUSP
40.0000 mg | INTRAMUSCULAR | Status: AC | PRN
  Administered 2016-07-24: 40 mg via INTRA_ARTICULAR

## 2016-07-24 MED ORDER — ALLOPURINOL 100 MG PO TABS: 100.0000 mg | ORAL_TABLET | Freq: Two times a day (BID) | ORAL | 3 refills | Status: DC

## 2016-07-24 MED ORDER — LIDOCAINE HCL 1 % IJ SOLN
2.0000 mL | INTRAMUSCULAR | Status: AC | PRN
  Administered 2016-07-24: 2 mL

## 2016-07-24 NOTE — Progress Notes (Signed)
Office Visit Note   Patient: Brian BeetsJohn Day           Date of Birth: [birth year]           MRN: [id number] Visit Date: 07/24/2016              Requested by: Gilda Creaseichard M Pavelock, MD [address] [place], [place] [id number] PCP: Gilda Creaseichard M Pavelock, MD  Chief Complaint  Patient presents with  . Right Foot - Follow-up    [facility], right foot cellulitis, tophus gout.      HPI: Patient is a 36 year old gentleman with ankle pain and swelling. Patient was started on colchicine and this didn't relieve his symptoms however he started developing GI upset nausea and vomiting and was unable to tolerate the medication. Past medical history positive for diabetes and hypertension with venous insufficiency his most recent hemoglobin A1c was 10.  Assessment & Plan: Visit Diagnoses:  1. Idiopathic chronic gout of right ankle without tophus     Plan: Recommended a proper diet with [place]'s knots and frayed. We will place him on some allopurinol the right ankle was injected without complications follow-up in 2 weeks. Patient was given a prescription for knee-high 15-20 mm compression stockings.  Follow-Up Instructions: Return in about 2 weeks (around 08/07/2016).   Ortho Exam  Patient is alert, oriented, no adenopathy, well-dressed, normal affect, normal respiratory effort. Examination patient has venous stasis swelling in both lower extremities. He has no plantar ulcers there is no cellulitis. The midfoot and forefoot are nontender to palpation patient does have swelling and tenderness to palpation over the ankle there is no warmth or redness there were no ulcers.  Imaging: No results found.  Labs: Lab Results  Component Value Date   HGBA1C 10.9 (H) 07/15/2016   HGBA1C 10.8 (H) 07/15/2016   HGBA1C 6.9% 11/23/2012   ESRSEDRATE 113 (H) 07/15/2016   CRP 10.6 (H) 07/15/2016   LABURIC 3.2 (L) 07/21/2016   REPTSTATUS 07/20/2016 FINAL  07/15/2016   CULT NO GROWTH 5 DAYS 07/15/2016    Orders:  No orders of the defined types were placed in this encounter.  No orders of the defined types were placed in this encounter.    Procedures: Medium Joint Inj Date/Time: 07/24/2016 9:59 AM Performed by: DUDA, MARCUS V Authorized by: Nadara MustardUDA, MARCUS V   Consent Given by:  Patient Site marked: the procedure site was marked   Timeout: prior to procedure the correct patient, procedure, and site was verified   Indications:  Pain and diagnostic evaluation Location:  Ankle Site:  R ankle Prep: patient was prepped and draped in usual sterile fashion   Needle Size:  22 G Needle Length:  1.5 inches Approach:  Anteromedial Ultrasound Guided: No   Fluoroscopic Guidance: No   Medications:  2 mL lidocaine 1 %; 40 mg methylPREDNISolone acetate 40 MG/ML Aspiration Attempted: No   Patient tolerance:  Patient tolerated the procedure well with no immediate complications    Clinical Data: No additional findings.  ROS:  All other systems negative, except as noted in the HPI. Review of Systems  Objective: Vital Signs: There were no vitals taken for this visit.  Specialty Comments:  No specialty comments available.  PMFS History: Patient Active Problem List   Diagnosis Date Noted  . Idiopathic chronic gout of right ankle without tophus   . Diabetic ulcer of right foot (HCC) 07/15/2016  . Cellulitis and abscess of right lower extremity  07/15/2016  . Influenza with pneumonia 06/08/2014  . C. difficile colitis 06/08/2014  . CAP (community acquired pneumonia) 06/07/2014  . Diabetic peripheral neuropathy (HCC) 12/29/2013  . Benign essential hypertension 10/27/2013  . Hypokalemia 11/23/2012  . Uncontrolled type 2 diabetes mellitus with hyperglycemia, without long-term current use of insulin (HCC) 11/23/2012  . Morbid obesity (HCC) 11/23/2012  . Low back pain 11/23/2012  . Fungal infection of skin of abdomen 11/23/2012   Past  Medical History:  Diagnosis Date  . Addiction, opium (HCC)   . Anxiety   . Arthritis    "right knee" (07/15/2016)  . CAP (community acquired pneumonia) 05/2014   hx/notes 06/07/2014  . Depression   . GERD (gastroesophageal reflux disease)   . Hypertension   . Migraine    "once q 3-4 years" (07/15/2016)  . Morbid obesity (HCC)   . Type II diabetes mellitus (HCC)     Family History  Problem Relation Age of Onset  . Diabetes Mother   . Hypertension Mother   . Hypertension Father     Past Surgical History:  Procedure Laterality Date  . KNEE ARTHROSCOPY Right 2000s   Social History   Occupational History  . Not on file.   Social History Main Topics  . Smoking status: Current Every Day Smoker    Packs/day: 1.00    Years: 12.00    Types: Cigarettes  . Smokeless tobacco: Former Neurosurgeon    Types: Snuff, Chew     Comment: 07/15/2016 "quit chew/snuff in my early 20s"  . Alcohol use No  . Drug use: No     Comment: 07/15/2016 "nothing since 05/27/2016; was abusing pain pills"  . Sexual activity: No

## 2016-07-25 ENCOUNTER — Telehealth: Payer: Self-pay | Admitting: Internal Medicine

## 2016-07-25 NOTE — Telephone Encounter (Signed)
   Reason for call:   I received a call from [name] at 723 PM and returned his call around 745PM.   Pertinent Data:   He complains that his foot is no better and is worse with any activity requiring weightbearing. He is using his cane to ambulate.  He has taken 2 tablets of Aleve and ibuprofen 800 mg this morning, lunch, evening.  Ankle itself is warm to touch and swollen though without redness, and  On further conversation, he recalled having no pain this morning with awakening.   Per review of the chart, he received a steroid injection yesterday.   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   His acknowledged the absence of systemic symptoms, like fever, chills, nausea, which would be more suggestive of a joint infection.  He also recalled being told that his uric acid level was normal, and I explained that sometimes it could be falsely low in the setting of an acute attack given that its deposition in the joint is the underlying mechanism for inflammation. He appreciated the explanation.  As always, pt is advised that if symptoms worsen or new symptoms arise, they should go to an urgent care facility or to to ER for further evaluation.   Brian Day, Brian Silvestro V, MD   07/25/2016, 7:44 PM

## 2016-07-29 ENCOUNTER — Ambulatory Visit: Payer: Medicaid Other

## 2016-07-31 ENCOUNTER — Ambulatory Visit (INDEPENDENT_AMBULATORY_CARE_PROVIDER_SITE_OTHER): Payer: Medicaid Other | Admitting: Internal Medicine

## 2016-07-31 ENCOUNTER — Telehealth: Payer: Self-pay | Admitting: *Deleted

## 2016-07-31 ENCOUNTER — Telehealth: Payer: Self-pay | Admitting: Pharmacist

## 2016-07-31 VITALS — BP 158/123 | HR 99 | Temp 98.2°F | Wt >= 6400 oz

## 2016-07-31 DIAGNOSIS — E1165 Type 2 diabetes mellitus with hyperglycemia: Secondary | ICD-10-CM

## 2016-07-31 DIAGNOSIS — L02415 Cutaneous abscess of right lower limb: Secondary | ICD-10-CM

## 2016-07-31 DIAGNOSIS — L02611 Cutaneous abscess of right foot: Secondary | ICD-10-CM

## 2016-07-31 DIAGNOSIS — Z79899 Other long term (current) drug therapy: Secondary | ICD-10-CM

## 2016-07-31 DIAGNOSIS — L03115 Cellulitis of right lower limb: Secondary | ICD-10-CM | POA: Diagnosis not present

## 2016-07-31 DIAGNOSIS — B9689 Other specified bacterial agents as the cause of diseases classified elsewhere: Secondary | ICD-10-CM | POA: Diagnosis not present

## 2016-07-31 DIAGNOSIS — E1142 Type 2 diabetes mellitus with diabetic polyneuropathy: Secondary | ICD-10-CM

## 2016-07-31 DIAGNOSIS — Z794 Long term (current) use of insulin: Secondary | ICD-10-CM | POA: Diagnosis not present

## 2016-07-31 MED ORDER — OXYCODONE-ACETAMINOPHEN 10-325 MG PO TABS: 1.0000 | ORAL_TABLET | Freq: Three times a day (TID) | ORAL | 0 refills | Status: DC | PRN

## 2016-07-31 NOTE — Assessment & Plan Note (Addendum)
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 Presented there with 1 week of pain, swelling, and redness of right foot with superficial ulcer of lateral 5th MTP draining purulent fluid. MRI showed concern of osteomyelitis of 5th metatarsal headache and small abscess throughotu midfoot. Was given vanc IV with good response, ortho was consulted, his cellulitis improved by this tiime but was still having pain over the ankle and MTP's. Ortho was concerned about gouty arthropathy and Charcot arthropathy. They recommedned continuing oral abx with doxy for 2 weeks and f/up with ortho. For gout he got colchichine but had GI upset with this. Was not started on allopurinol as this was his first attack. Saw Dr. Lajoyce Cornersuda on 5/3, had steroid injection of right ankle by Lajoyce Cornersuda.   This could be chronic osteomyelits resulting from his foot ulcer but his swelling could also be due to charcot's foot due to diabetic neuropathy.   Has f/up with Dr. Lajoyce Cornersuda on Monday. I encouraged him to continue the bactrim prescribed by Dr. Lajoyce Cornersuda for now. Gave him percocet 12 tablts to last until MOnday. No plan for chronic pain med, only until definite treatment such as surgery if Dr. Lajoyce Cornersuda plans to do that. Tylenol, NSAID, and tramadol did not help. He also bought percocet from the street to treat his pain. I cautioned him not to buy or sell any pain meds going forward as that would prohibit us from giving him more pain meds. He promised me that he will not do that.   f/up next week.

## 2016-07-31 NOTE — Telephone Encounter (Signed)
Pharmacist called stating patient there to refill percocet. Patient was recently using subsolv & wanted to know if MD aware of this. Per OV notes today, MD aware of subsolv. Informed pharmacy.

## 2016-07-31 NOTE — Patient Instructions (Signed)
Continue Insulin at current dose.  Keep checking your sugar  Follow up in 1 week.  Follow up with Dr. Lajoyce Cornersuda.

## 2016-07-31 NOTE — Progress Notes (Signed)
CC: hospital f/up for cellulitis and abscess of right lower ext  HPI:  [name] is a 36 y.o. with pmh as listed below is here for hospital f/up for infection of right lower ext, also to discuss about chronic pain medication  Past Medical History:  Diagnosis Date  . Addiction, opium (HCC)   . Anxiety   . Arthritis    "right knee" (07/15/2016)  . CAP (community acquired pneumonia) 05/2014   hx/notes 06/07/2014  . Depression   . GERD (gastroesophageal reflux disease)   . Hypertension   . Migraine    "once q 3-4 years" (07/15/2016)  . Morbid obesity (HCC)   . 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 Presented there with 1 week of pain, swelling, and redness of right foot with superficial ulcer of lateral 5th MTP draining purulent fluid. MRI showed concern of osteomyelitis of 5th metatarsal headache and small abscess throughotu midfoot. Was given vanc IV with good response, ortho was consulted, his cellulitis improved by this tiime but was still having pain over the ankle and MTP's. Ortho was concerned about gouty arthropathy and Charcot arthropathy. They recommedned continuing oral abx with doxy for 2 weeks and f/up with ortho. For gout he got colchichine but had GI upset with this. Was not started on allopurinol as this was his first attack.   DM II - he was hyperglycemic in 400's (was not on treatment rpeviously). hgab1c 10.9, was dischartged to 70/30 novologin 60 units BID and metformin 1000mg  bid.   Saw Dr. Lajoyce Day on 5/3, had steroid injection of right ankle by Brian Day.   He continues to have right ankle/foot pain and also knee pain, has numbness on right feet. Tried 800mg  ibuprofen and exra strength tylenol every 4-6 hours but has not had any relief. Has bought percocet from a friend and took it for pain relief and it worked. Wants something for pain.  Was on zubsolv 8.6 AM, 5.7  afternoon and 5.7 in the evening in the past for 3 years with Dr. Nancee Day. He had chronic pain since 5 years, was on percocet initially, then started to over use it, bought from the street sometimes as well, so due to pain contract violation his percoet prescription was stopped, continued to misuse it from the street as he was having withdrawal at that time including diarrhea. Then he was put on maintenance therapy with Zubsolv with Dr. Nancee Day. Currently he has no withdrawal symnptoms despite not taking zubsolv for last few weeks, was off it for 6 months previously without any withdrawal symptom as well. In the past he had social arugments related to opioud use as well as tolerance, withdrawal, recurrent opioud use, craving, which was consistent with moderate OUD.  Now he does not have any withdrawals but does still misuse opioud by getting it from the street to treat his pain. Has hx of THC use, no other drug use, no IV drug use.   Review of Systems:   Review of Systems  Constitutional: Negative for chills and fever.  Respiratory: Negative for cough and hemoptysis.   Cardiovascular: Negative for chest pain and palpitations.  Musculoskeletal: Positive for joint pain. Negative for myalgias.  Neurological: Negative for dizziness and headaches.  Psychiatric/Behavioral: Negative for depression.     Physical Exam:  Vitals:   07/31/16 0958  BP: (!) 158/123  Pulse: 99  Temp: 98.2 F (36.8 C)  TempSrc: Oral  SpO2: 97%  Weight: (!) 446 lb 14.4 oz (202.7 kg)   Physical Exam  Constitutional:  Obese male. NAD.   HENT:  Head: Normocephalic and atraumatic.  Cardiovascular: Normal rate and regular rhythm.  Exam reveals no gallop and no friction rub.   No murmur heard. Respiratory: Effort normal and breath sounds normal.  Musculoskeletal:  Right ankle has swelling on the dorsal surface and around the ankle joint with erythema and some warmth. ROM of the ankle is limited due to pain and swelling.    No open wounds.   Has significantly decreased sensation to touch and also on monofilament test distal to the midfoot on both sides. Lacks vibratory sense on both sides upto the knees.   Vibration intact on upper exts.     Assessment & Plan:   See Encounters Tab for problem based charting.  Patient seen with Dr. Criselda Day

## 2016-07-31 NOTE — Assessment & Plan Note (Signed)
Home sugar readings are improving on insulin and staying under 200's now after modifying his diet. No hypoglycemia.  Cont metformin 1000mg  bid + novolog 70/30 60 units BID Gave samples of 2 vials of novolog, waiting for medicaid approval. f/up in 1 week

## 2016-07-31 NOTE — Telephone Encounter (Signed)
-----   Message from Hyacinth Meekerasrif Ahmed, MD sent at 07/31/2016 11:49 AM EDT ----- Would you please help him with his meds. I tried to find you but you were not available.  He is waiting for medicaid, can't afford any of his meds. I gave him 2 vials of novolog 70/30 today. But he needs help with all of his other meds.  Thanks a llot.  Tasrif

## 2016-07-31 NOTE — Assessment & Plan Note (Signed)
Has significant vibratory sense loss on both sides likely due to diabetes.  Already on gabapentin 900mg  TID. Cont this.  Control DM II better.

## 2016-08-01 NOTE — Progress Notes (Signed)
Contacted patient to review medications, he states he can afford $4 for now while awaiting medicaid, [facility]. Patient verbalized understanding.

## 2016-08-04 ENCOUNTER — Ambulatory Visit (INDEPENDENT_AMBULATORY_CARE_PROVIDER_SITE_OTHER): Payer: Self-pay | Admitting: Orthopedic Surgery

## 2016-08-04 ENCOUNTER — Encounter (INDEPENDENT_AMBULATORY_CARE_PROVIDER_SITE_OTHER): Payer: Self-pay | Admitting: Orthopedic Surgery

## 2016-08-04 ENCOUNTER — Ambulatory Visit (INDEPENDENT_AMBULATORY_CARE_PROVIDER_SITE_OTHER): Payer: Self-pay

## 2016-08-04 DIAGNOSIS — S92901K Unspecified fracture of right foot, subsequent encounter for fracture with nonunion: Secondary | ICD-10-CM

## 2016-08-04 DIAGNOSIS — E1165 Type 2 diabetes mellitus with hyperglycemia: Secondary | ICD-10-CM

## 2016-08-04 DIAGNOSIS — E11610 Type 2 diabetes mellitus with diabetic neuropathic arthropathy: Secondary | ICD-10-CM

## 2016-08-04 MED ORDER — OXYCODONE-ACETAMINOPHEN 10-325 MG PO TABS: 1.0000 | ORAL_TABLET | Freq: Three times a day (TID) | ORAL | 0 refills | Status: DC | PRN

## 2016-08-04 NOTE — Progress Notes (Signed)
Patient went to see Dr. Lajoyce Cornersuda and his pain was thought to be from diabetic neurogenic arthropathy and nonunion foot fx of right.  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 Patient was thought to be at high risk for internal fixation.  He continues to be in pain, ran out of the percocet 12 tablets I gave him.  Requesting something for his excruating pain.  Gave him another script of percocet #15 tablets. He needs to be seen before any further refill as he had hx of buying meds from the street in the past.

## 2016-08-04 NOTE — Addendum Note (Signed)
Addended by: Hyacinth MeekerAHMED, Cadin Luka on: 08/04/2016 04:14 PM   Modules accepted: Orders

## 2016-08-04 NOTE — Progress Notes (Signed)
Office Visit Note   Patient: Brian Day           Date of Birth: [birth year]           MRN: [id number] Visit Date: 08/04/2016              Requested by: Gilda CreasePavelock, Richard M, MD [address] [place], [place] [id number] PCP: Patient, No Pcp Per  Chief Complaint  Patient presents with  . Right Foot - Edema, Pain      HPI: Patient is a 36 year old gentleman morbidly obese with type 2 diabetes who states about 4 years ago he fell on the right foot sustaining a fracture of the fourth metatarsal. Patient states that he was in a fracture boot for about 4 months. He states the foot pain has persisted. Patient states the ankle injection on his last visit did not help with his pain relief.  Assessment & Plan: Visit Diagnoses:  1. Diabetic neurogenic arthropathy (HCC)   2. Uncontrolled type 2 diabetes mellitus with hyperglycemia, without long-term current use of insulin (HCC)   3. Nonunion of foot fracture, right     Plan: We'll place him in a short leg cast for the fibrous union of the fourth metatarsal fracture. Discussed the patient is at increased risk of attempted internal fixation with most likely microcirculatory disease due to his diabetes.  Follow-Up Instructions: No Follow-up on file.   Ortho Exam  Patient is alert, oriented, no adenopathy, well-dressed, normal affect, normal respiratory effort. Examination he has an antalgic gait examination the foot has no redness no cellulitis no hypersensitivity to light touch he does have swelling across the midfoot. The ankle is nontender to palpation.  Imaging: No results found.  Labs: Lab Results  Component Value Date   HGBA1C 10.9 (H) 07/15/2016   HGBA1C 10.8 (H) 07/15/2016   HGBA1C 6.9% 11/23/2012   ESRSEDRATE 113 (H) 07/15/2016   CRP 10.6 (H) 07/15/2016   LABURIC 3.2 (L) 07/21/2016   REPTSTATUS 07/20/2016 FINAL 07/15/2016   CULT NO GROWTH 5 DAYS 07/15/2016    Orders:  Orders Placed This Encounter    Procedures  . XR Foot Complete Right   No orders of the defined types were placed in this encounter.    Procedures: No procedures performed  Clinical Data: No additional findings.  ROS:  All other systems negative, except as noted in the HPI. Review of Systems  Objective: Vital Signs: There were no vitals taken for this visit.  Specialty Comments:  No specialty comments available.  PMFS History: Patient Active Problem List   Diagnosis Date Noted  . Diabetic neurogenic arthropathy (HCC) 08/04/2016  . Nonunion of foot fracture, right 08/04/2016  . Idiopathic chronic gout of right ankle without tophus   . Diabetic ulcer of right foot (HCC) 07/15/2016  . Cellulitis and abscess of right lower extremity 07/15/2016  . Diabetic peripheral neuropathy (HCC) 12/29/2013  . Benign essential hypertension 10/27/2013  . Hypokalemia 11/23/2012  . Uncontrolled type 2 diabetes mellitus with hyperglycemia, without long-term current use of insulin (HCC) 11/23/2012  . Morbid obesity (HCC) 11/23/2012  . Low back pain 11/23/2012  . Fungal infection of skin of abdomen 11/23/2012   Past Medical History:  Diagnosis Date  . Addiction, opium (HCC)   . Anxiety   . Arthritis    "right knee" (07/15/2016)  . CAP (community acquired pneumonia) 05/2014   hx/notes 06/07/2014  . Depression   . GERD (gastroesophageal reflux disease)   . Hypertension   .  Migraine    "once q 3-4 years" (07/15/2016)  . Morbid obesity (HCC)   . Type II diabetes mellitus (HCC)     Family History  Problem Relation Age of Onset  . Diabetes Mother   . Hypertension Mother   . Hypertension Father     Past Surgical History:  Procedure Laterality Date  . KNEE ARTHROSCOPY Right 2000s   Social History   Occupational History  . Not on file.   Social History Main Topics  . Smoking status: Current Every Day Smoker    Packs/day: 1.00    Years: 12.00    Types: Cigarettes  . Smokeless tobacco: Former Neurosurgeon    Types:  Snuff, Chew     Comment: 07/15/2016 "quit chew/snuff in my early 20s"  . Alcohol use No  . Drug use: No     Comment: 07/15/2016 "nothing since 05/27/2016; was abusing pain pills"  . Sexual activity: No

## 2016-08-05 NOTE — Progress Notes (Signed)
Internal Medicine Clinic Attending  I saw and evaluated the patient.  I personally confirmed the key portions of the history and exam documented by Dr. Tasia CatchingsAhmed and I reviewed pertinent patient test results.  The assessment, diagnosis, and plan were formulated together and I agree with the documentation in the resident's note.  I discussed [name]'s use of opiates at length.  He did qualify as moderate OUD in the past, but now denies any withdrawal symptoms, denies cravings.  He adamently reports that he is buying percocet because it is the "only" thing that helps with the pain from his foot issues.  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  He reports being off zubsolv for 3 weeks with no withdrawal, no cravings.  He is asking for better pain management.  He was on a zubsolv regimen from a pain clinic for pain treatment and OUD.    I think he has real reasons for pain.  He is at risk for misuse of narcotics in the future.  He should be given a pain regimen for his active orthopedic issues, using the lowest dose of narcotics possible.  He is requesting Dr. Mikey BussingHoffman be his PCP, which Dr. Mikey BussingHoffman is amenable to.  I do not think he currently qualifies for our OUD clinic and suboxone therapy.  This may change in future and should be monitored closely.   Plan per Dr. Tasia CatchingsAhmed, he needs follow up with Dr. Mikey BussingHoffman in the close future to develop a pain management plan which includes a multi-modal plan including anti-inflammatory, neuropathic pain medication and possibly a very low dose of narcotic pain therapy.

## 2016-08-06 ENCOUNTER — Telehealth (INDEPENDENT_AMBULATORY_CARE_PROVIDER_SITE_OTHER): Payer: Self-pay | Admitting: Orthopedic Surgery

## 2016-08-06 ENCOUNTER — Ambulatory Visit (INDEPENDENT_AMBULATORY_CARE_PROVIDER_SITE_OTHER): Payer: Self-pay | Admitting: Family

## 2016-08-06 DIAGNOSIS — E1165 Type 2 diabetes mellitus with hyperglycemia: Secondary | ICD-10-CM

## 2016-08-06 DIAGNOSIS — S92901K Unspecified fracture of right foot, subsequent encounter for fracture with nonunion: Secondary | ICD-10-CM

## 2016-08-06 NOTE — Telephone Encounter (Signed)
Advised patients mother to bring patient in so we can evaluate in office. Cast was put on Monday. Unfortunately cast breakdown is very likely to patients body habitus.

## 2016-08-06 NOTE — Telephone Encounter (Signed)
Patients mother called stating that her son has a blister on his foot from the cast. She was wondering what they needed to do if anything. CB # [phone]

## 2016-08-06 NOTE — Progress Notes (Signed)
Office Visit Note   Patient: Brian BeetsJohn Day           Date of Birth: 09/15/1980           MRN: [id number] Visit Date: 08/06/2016              Requested by: No referring provider defined for this encounter. PCP: Patient, No Pcp Per  No chief complaint on file.     HPI: Patient is a 36 year old gentleman morbidly obese with type 2 diabetes who states about 4 years ago he fell on the right foot sustaining a fracture of the fourth metatarsal. Patient states that he was in a fracture boot for about 4 months. He states the foot pain has persisted. Patient states the recent ankle injection on his last visit did not help with his pain relief.  Was placed in a cast at last visit on Monday of this week. States has been full weight bearing in this. States is unable to nonweight bearing either with crutches or wheel chair. Has developed an ulceration over dorsomedial foot from rubbing. Would like the cast discontinued.  Assessment & Plan: Visit Diagnoses:  No diagnosis found.  Plan: Discussed necessity of keeping weight off the foot as much as possible at length. 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 Cast discontinued. Keep follow up visit as scheduled.   Follow-Up Instructions: No Follow-up on file.   Ortho Exam  Patient is alert, oriented, no adenopathy, well-dressed, normal affect, normal respiratory effort. Examination he has an antalgic gait examination the foot has no redness no cellulitis no hypersensitivity to light touch he does have swelling across the midfoot. Superficial ulceration to dorsomedial left foot. There is no surrounding erythema, drainage odor or sign of infection. The ankle is nontender to palpation.  Imaging: No results found.  Labs: Lab Results  Component Value Date   HGBA1C 10.9 (H) 07/15/2016   HGBA1C 10.8 (H) 07/15/2016   HGBA1C 6.9% 11/23/2012   ESRSEDRATE 113 (H) 07/15/2016   CRP 10.6 (H) 07/15/2016   LABURIC 3.2 (L) 07/21/2016   REPTSTATUS 07/20/2016 FINAL 07/15/2016   CULT NO GROWTH 5 DAYS 07/15/2016    Orders:  No orders of the defined types were placed in this encounter.  No orders of the defined types were placed in this encounter.    Procedures: No procedures performed  Clinical Data: No additional findings.  ROS:  All other systems negative, except as noted in the HPI. Review of Systems  Constitutional: Negative for chills and fever.  Musculoskeletal: Positive for arthralgias.    Objective: Vital Signs: There were no vitals taken for this visit.  Specialty Comments:  No specialty comments available.  PMFS History: Patient Active Problem List   Diagnosis Date Noted  . Diabetic neurogenic arthropathy (HCC) 08/04/2016  . Nonunion of foot fracture, right 08/04/2016  . Idiopathic chronic gout of right ankle without tophus   . Diabetic ulcer of right foot (HCC) 07/15/2016  . Cellulitis and abscess of right lower extremity 07/15/2016  . Diabetic peripheral neuropathy (HCC) 12/29/2013  . Benign essential hypertension 10/27/2013  . Hypokalemia 11/23/2012  . Uncontrolled type 2 diabetes mellitus with hyperglycemia, without long-term current use of insulin (HCC) 11/23/2012  . Morbid obesity (HCC) 11/23/2012  . Low back pain 11/23/2012  . Fungal infection of skin of abdomen 11/23/2012   Past Medical History:  Diagnosis Date  . Addiction, opium (HCC)   . Anxiety   . Arthritis    "  right knee" (07/15/2016)  . CAP (community acquired pneumonia) 05/2014   hx/notes 06/07/2014  . Depression   . GERD (gastroesophageal reflux disease)   . Hypertension   . Migraine    "once q 3-4 years" (07/15/2016)  . Morbid obesity (HCC)   . Type II diabetes mellitus (HCC)     Family History  Problem Relation Age of Onset  . Diabetes Mother   . Hypertension Mother   . Hypertension Father     Past Surgical History:  Procedure Laterality Date  . KNEE ARTHROSCOPY Right 2000s    Social History   Occupational History  . Not on file.   Social History Main Topics  . Smoking status: Current Every Day Smoker    Packs/Day: 1.00    Years: 12.00    Types: Cigarettes  . Smokeless tobacco: Former Neurosurgeon    Types: Snuff, Chew     Comment: 07/15/2016 "quit chew/snuff in my early 20s"  . Alcohol use No  . Drug use: No     Comment: 07/15/2016 "nothing since 05/27/2016; was abusing pain pills"  . Sexual activity: No

## 2016-08-08 NOTE — Telephone Encounter (Signed)
No answer

## 2016-08-09 ENCOUNTER — Telehealth: Payer: Self-pay | Admitting: Internal Medicine

## 2016-08-09 NOTE — Telephone Encounter (Signed)
  INTERNAL MEDICINE RESIDENCY PROGRAM After-Hours Telephone Call    Reason for call:   I received a call from [name] at 10:45 PM, 08/09/2016 indicating having right foot pain    Pertinent Data:   Patient was seen in our clinic and also Dr. Audrie Liauda's clinic for his 4th metatarsal fx and diabetic related arthropathy causing sever pain. He has chronic opioid misuse hx and had been on buprenorphine in the past. We saw him and gave him percoet for his acute pain. Dr. Lajoyce Cornersuda put a cast on his foot which caused blsitering and had to be taken out, then was given a boot which has also caused blistering so he is not wearing it anymore.   He also noticed continuation of his swelling and redness of his right foot from midfoot to upward. Having severe pain which is not controlled with percocet. Took percocet 3x so far today without help. Does not have any more percocet.  No fever or any other systemic signs of infection.    Assessment / Plan / Recommendations:   I have asked him to keep his feet elevated as much as possible and to minimize any weight bearing. Notified him that I can't prescribed any pain medication over the phone so the earliest we could be able to do anything for his pain would be on Monday morning which is the time [facility]  I asked him to also call Dr. Audrie Liauda's office and leave VM to let them know about his pain and swelling  He was advised to go the urgent care or ED if pain continued to worsen. He stated he will just wait to be seen on Monday morning.   As always, pt is advised that if symptoms worsen or new symptoms arise, they should go to an urgent care facility or to to ER for further evaluation.    Brian Day, Brian Faircloth, MD   08/09/2016, 10:45 PM

## 2016-08-11 ENCOUNTER — Ambulatory Visit (INDEPENDENT_AMBULATORY_CARE_PROVIDER_SITE_OTHER): Payer: Medicaid Other | Admitting: Internal Medicine

## 2016-08-11 ENCOUNTER — Encounter: Payer: Self-pay | Admitting: Internal Medicine

## 2016-08-11 ENCOUNTER — Encounter (INDEPENDENT_AMBULATORY_CARE_PROVIDER_SITE_OTHER): Payer: Self-pay

## 2016-08-11 VITALS — BP 125/75 | HR 77 | Temp 98.3°F | Ht 72.0 in | Wt >= 6400 oz

## 2016-08-11 DIAGNOSIS — Z7984 Long term (current) use of oral hypoglycemic drugs: Secondary | ICD-10-CM

## 2016-08-11 DIAGNOSIS — E1165 Type 2 diabetes mellitus with hyperglycemia: Secondary | ICD-10-CM | POA: Diagnosis not present

## 2016-08-11 DIAGNOSIS — E1142 Type 2 diabetes mellitus with diabetic polyneuropathy: Secondary | ICD-10-CM | POA: Diagnosis not present

## 2016-08-11 DIAGNOSIS — S92341K Displaced fracture of fourth metatarsal bone, right foot, subsequent encounter for fracture with nonunion: Secondary | ICD-10-CM | POA: Diagnosis not present

## 2016-08-11 DIAGNOSIS — M79671 Pain in right foot: Secondary | ICD-10-CM

## 2016-08-11 DIAGNOSIS — F1120 Opioid dependence, uncomplicated: Secondary | ICD-10-CM

## 2016-08-11 DIAGNOSIS — G8929 Other chronic pain: Secondary | ICD-10-CM

## 2016-08-11 DIAGNOSIS — X58XXXD Exposure to other specified factors, subsequent encounter: Secondary | ICD-10-CM

## 2016-08-11 DIAGNOSIS — G8921 Chronic pain due to trauma: Secondary | ICD-10-CM

## 2016-08-11 MED ORDER — GABAPENTIN 300 MG PO CAPS: 900.0000 mg | ORAL_CAPSULE | Freq: Three times a day (TID) | ORAL | 0 refills | Status: DC

## 2016-08-11 MED ORDER — GABAPENTIN 300 MG PO CAPS: 300.0000 mg | ORAL_CAPSULE | Freq: Three times a day (TID) | ORAL | 0 refills | Status: DC

## 2016-08-11 MED ORDER — MELOXICAM 15 MG PO TABS: 15.0000 mg | ORAL_TABLET | Freq: Every day | ORAL | 0 refills | Status: DC

## 2016-08-11 MED ORDER — METFORMIN HCL 1000 MG PO TABS: 1000.0000 mg | ORAL_TABLET | Freq: Two times a day (BID) | ORAL | 3 refills | Status: DC

## 2016-08-11 MED ORDER — OXYCODONE-ACETAMINOPHEN 10-325 MG PO TABS: 1.0000 | ORAL_TABLET | Freq: Three times a day (TID) | ORAL | 0 refills | Status: DC | PRN

## 2016-08-11 NOTE — Assessment & Plan Note (Signed)
Assessment He would like a refill of metformin today.  Plan -Refilled metformin 1000 mg twice daily

## 2016-08-11 NOTE — Patient Instructions (Signed)
Try taking meloxicam 1 tablet daily with your Percocet.  We will work on getting you scheduled to see Dr. Mikey BussingHoffman on 5/31. Expect a call from the clinic next Tuesday.

## 2016-08-11 NOTE — Assessment & Plan Note (Signed)
Assessment He has a non-healing fourth metatarsal fracture of the right foot for which he wore a boot but was then changed to a cast last week after he felt the boot didn't fit him. He only wore the cast for a day as it caused him to develop a blister.  Per review of the [place] DEA database, he last refilled oxycodone/acetaminophen 10- 325 x 15 tablets on 08/04/16 but ran out of it by the time he called the on-call provider on 07/30/16 and has not been refilled over the time interval.  He would like something for his right foot pain today. He suggested buprenoprhine and oxycodone/acetaminophen, and I explained to him that I could not prescribe that combination as a resident physician. As he has not had success with naproxen or ibuprofen, he was agreeable to trying meloxicam as he has experienced some relief with it in the past. His functional goal is to bear weight and acknowledges he cannot be completely pain-free. He denies any adverse effects of opiate therapy like nausea, vomiting, constipation, decreased appetite.  Plan -Prescribed meloxicam 15 mg tablets as anti-inflammatory therapy along with oxycodone/acetaminophen 10- 325 x 40 tablets to be taken every 8 hours as needed which should last him until 5/31 at which time he can assume care with Dr .Hoffman 

## 2016-08-11 NOTE — Progress Notes (Signed)
   CC: right foot pain  HPI:  [name] is a 36 y.o. who presents today with his mother for right foot pain. Please see assessment & plan for status of chronic medical problems.   Past Medical History:  Diagnosis Date  . Addiction, opium (HCC)   . Anxiety   . Arthritis    "right knee" (07/15/2016)  . CAP (community acquired pneumonia) 05/2014   hx/notes 06/07/2014  . Depression   . GERD (gastroesophageal reflux disease)   . Hypertension   . Migraine    "once q 3-4 years" (07/15/2016)  . Morbid obesity (HCC)   . Type II diabetes mellitus (HCC)     Review of Systems:  Please see each problem below for a pertinent review of systems.   Physical Exam:  Vitals:   08/11/16 1105  BP: 125/75  Pulse: 77  Temp: 98.3 F (36.8 C)  TempSrc: Oral  SpO2: 100%  Weight: (!) 465 lb (210.9 kg)  Height: 6' (1.829 m)   Physical Exam  Constitutional: No distress.  HENT:  Head: Normocephalic and atraumatic.  Eyes: Conjunctivae are normal. No scleral icterus.  Pulmonary/Chest: Effort normal. No respiratory distress.  Musculoskeletal:  Right foot without exquisite warmth to palpation, erythema, blister near the medial aspect without any purulent drainage. Palpated dorsalis pedis pulse.  Skin: He is not diaphoretic.    Assessment & Plan:   See Encounters Tab for problem based charting.  Patient discussed with Dr. Criselda PeachesMullen

## 2016-08-11 NOTE — Assessment & Plan Note (Signed)
Assessment He has a non-healing fourth metatarsal fracture of the right foot for which he wore a boot but was then changed to a cast last week after he felt the boot didn't fit him. He only wore the cast for a day as it caused him to develop a blister.  Per review of the [place] DEA database, he last refilled oxycodone/acetaminophen 10- 325 x 15 tablets on 08/04/16 but ran out of it by the time he called the on-call provider on 07/30/16 and has not been refilled over the time interval.  He would like something for his right foot pain today. He suggested buprenoprhine and oxycodone/acetaminophen, and I explained to him that I could not prescribe that combination as a resident physician. As he has not had success with naproxen or ibuprofen, he was agreeable to trying meloxicam as he has experienced some relief with it in the past. His functional goal is to bear weight and acknowledges he cannot be completely pain-free. He denies any adverse effects of opiate therapy like nausea, vomiting, constipation, decreased appetite.  Plan -Prescribed meloxicam 15 mg tablets as anti-inflammatory therapy along with oxycodone/acetaminophen 10- 325 x 40 tablets to be taken every 8 hours as needed which should last him until 5/31 at which time he can assume care with Dr .Mikey BussingHoffman

## 2016-08-11 NOTE — Assessment & Plan Note (Addendum)
Assessment He would like a refill of gabapentin today.  Plan -Refilled gabapentin 900 mg to be taken three times daily. I accidentally sent the prescription as 300 mg to be taken three times daily but called the pharmacy to correct the dosage.

## 2016-08-12 NOTE — Progress Notes (Signed)
Internal Medicine Clinic Attending  Case discussed with Dr. Heywood IlesPatel,Brian at the time of the visit.  We reviewed the resident's history and exam and pertinent patient test results.  I agree with the assessment, diagnosis, and plan of care documented in the resident's note.  This is a difficult case.  At last visit, Dr. Tasia Day assessed [name] thoroughly for OUD and he did not currently meet criteria.  He also told Brian Day that he was "allergic" to suboxone and was on zubsolv.  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  I do not think at this time that mixing suboxone and other opiates is appropriate.  He has need for pain control, but is at risk for misusing opiates in the future.  The ideal situation would be to find a multimodal approach with scheduled non narcotic analgesia and prn low dose narcotics to help him be more active, etc.  I discussed this at last visit with Dr. Mikey Day who will be taking over as PCP.  Dr. Mikey Day, unfortunately does not have any open appointments at this time.  I agree with plan by Dr. Allena Day.  There will need to be ongoing discussions about a pain management plan for this patient.

## 2016-08-14 ENCOUNTER — Ambulatory Visit: Payer: Self-pay

## 2016-08-15 ENCOUNTER — Telehealth: Payer: Self-pay

## 2016-08-15 NOTE — Telephone Encounter (Signed)
Called pt, states the percocet is not working, had been taking more than he was suppose to now has decided to stop it all together because it does not help, he has continued the meloxicam, [facility], reminded to bring all meds even if not taking

## 2016-08-15 NOTE — Telephone Encounter (Signed)
If you could switch to the afternoon, Dr. Mikey BussingHoffman should be available [facility]. I think he needs to be a part of his management since he may assume care as PCP.

## 2016-08-15 NOTE — Telephone Encounter (Signed)
Requesting to speak with a nurse about meds. Please call pt back.  

## 2016-08-19 ENCOUNTER — Ambulatory Visit (INDEPENDENT_AMBULATORY_CARE_PROVIDER_SITE_OTHER): Payer: Medicaid Other | Admitting: Internal Medicine

## 2016-08-19 ENCOUNTER — Encounter: Payer: Self-pay | Admitting: Internal Medicine

## 2016-08-19 VITALS — BP 135/80 | HR 88 | Temp 97.7°F | Ht 72.0 in | Wt >= 6400 oz

## 2016-08-19 DIAGNOSIS — G8929 Other chronic pain: Secondary | ICD-10-CM | POA: Diagnosis not present

## 2016-08-19 DIAGNOSIS — E11610 Type 2 diabetes mellitus with diabetic neuropathic arthropathy: Secondary | ICD-10-CM

## 2016-08-19 DIAGNOSIS — S92341G Displaced fracture of fourth metatarsal bone, right foot, subsequent encounter for fracture with delayed healing: Secondary | ICD-10-CM

## 2016-08-19 DIAGNOSIS — F329 Major depressive disorder, single episode, unspecified: Secondary | ICD-10-CM

## 2016-08-19 DIAGNOSIS — E1165 Type 2 diabetes mellitus with hyperglycemia: Secondary | ICD-10-CM | POA: Diagnosis not present

## 2016-08-19 DIAGNOSIS — Z79891 Long term (current) use of opiate analgesic: Secondary | ICD-10-CM

## 2016-08-19 DIAGNOSIS — M79671 Pain in right foot: Secondary | ICD-10-CM | POA: Diagnosis not present

## 2016-08-19 DIAGNOSIS — Z597 Insufficient social insurance and welfare support: Secondary | ICD-10-CM

## 2016-08-19 DIAGNOSIS — E1169 Type 2 diabetes mellitus with other specified complication: Secondary | ICD-10-CM | POA: Diagnosis not present

## 2016-08-19 DIAGNOSIS — Z794 Long term (current) use of insulin: Secondary | ICD-10-CM | POA: Diagnosis not present

## 2016-08-19 DIAGNOSIS — Z6841 Body Mass Index (BMI) 40.0 and over, adult: Secondary | ICD-10-CM | POA: Diagnosis not present

## 2016-08-19 DIAGNOSIS — M86671 Other chronic osteomyelitis, right ankle and foot: Secondary | ICD-10-CM | POA: Diagnosis not present

## 2016-08-19 DIAGNOSIS — F331 Major depressive disorder, recurrent, moderate: Secondary | ICD-10-CM

## 2016-08-19 DIAGNOSIS — X58XXXD Exposure to other specified factors, subsequent encounter: Secondary | ICD-10-CM

## 2016-08-19 MED ORDER — OXYCODONE-ACETAMINOPHEN 10-325 MG PO TABS: 1.0000 | ORAL_TABLET | Freq: Three times a day (TID) | ORAL | 0 refills | Status: DC | PRN

## 2016-08-19 MED ORDER — GLIPIZIDE 5 MG PO TABS: 2.5000 mg | ORAL_TABLET | Freq: Every day | ORAL | 1 refills | Status: DC

## 2016-08-19 MED ORDER — INSULIN NPH ISOPHANE & REGULAR (70-30) 100 UNIT/ML ~~LOC~~ SUSP: 60.0000 [IU] | Freq: Two times a day (BID) | SUBCUTANEOUS | 11 refills | Status: DC

## 2016-08-19 MED ORDER — SERTRALINE HCL 100 MG PO TABS: 150.0000 mg | ORAL_TABLET | Freq: Every day | ORAL | 1 refills | Status: DC

## 2016-08-19 NOTE — Telephone Encounter (Signed)
Called pt last Friday, no answer

## 2016-08-19 NOTE — Patient Instructions (Signed)
[name],  It was a pleasure to see you today. For your diabetes, please continue to take your insulin and metformin as previously prescribed. Please start taking glipizide 2.5 mg (half of a tablet) once a day. Please continue to check your blood sugar daily. For your pain, please take percocet every 8 hours as prescribed. I have increased the dose of your zoloft to 150 mg daily (1 and 1/2 tablets). Please follow up with us in 2 weeks. If you have any questions or concerns, call our clinic at [phone] or after hours call [phone] and ask for the internal medicine resident on call. Thank you!  - Dr. Antony ContrasGuilloud

## 2016-08-20 NOTE — Assessment & Plan Note (Signed)
Patient reports his CBGs have been running high in the 200 range since being discharged from the hospital. His last A1c checked 07/15/2016 was 10.9. He is currently taking 70/30 insulin 60 units BID and metformin 1,000 mg BID. He reports compliance. Unfortunately patient is uninsured limiting therapeutic options. We have discussed starting glipizide on the [employer] $4 list. Patient is agreeable. Advised patient to monitor for signs and symptoms of hypoglycemia. Return to clinic in 2 weeks with glucometer for follow up.  -- Continue Novolin 70/30 60 units BID -- Continue Metformin 1,000 mg BID -- Start glipizide 2.5 mg daily, up titrate to goal  -- Follow up 2 weeks with glucometer

## 2016-08-20 NOTE — Progress Notes (Signed)
Internal Medicine Clinic Attending  I saw and evaluated the patient.  I personally confirmed the key portions of the history and exam documented by Dr. Antony ContrasGuilloud and I reviewed pertinent patient test results.  The assessment, diagnosis, and plan were formulated together and I agree with the documentation in the resident's note.  Difficult case of chronic pain that is hard to control. He has underlying depression which is complicating his pain control, he clearly has poor coping mechanisms. I do not think he would be a good candidate for suboxone alone. He has tolerance to opioids, but no dependence and is not at risk for withdrawal. We will continue opioids with oxycodone 10mg  tid for now, but I anticipate we will have to taper this down over the coming weeks to months as his foot pain improves.

## 2016-08-20 NOTE — Assessment & Plan Note (Addendum)
Patient reports his depression has recently been uncontrolled on his Zoloft 100 mg daily. He has been on this dose for many years. He reports feelings of depressed mood and anhedonia. -- Increase Zoloft 150 mg daily

## 2016-08-20 NOTE — Progress Notes (Addendum)
   CC: Right foot pain   HPI:  Brian Day is a 36 y.o. male with past medical history outlined below here for follow up of his right foot pain. For the details of today's visit, please refer to the assessment and plan.  Past Medical History:  Diagnosis Date  . Addiction, opium (HCC)   . Anxiety   . Arthritis    "right knee" (07/15/2016)  . CAP (community acquired pneumonia) 05/2014   hx/notes 06/07/2014  . Depression   . GERD (gastroesophageal reflux disease)   . Hypertension   . Migraine    "once q 3-4 years" (07/15/2016)  . Morbid obesity (HCC)   . Type II diabetes mellitus (HCC)     Review of Systems:  Denies fevers and chills.   Physical Exam:  Vitals:   08/19/16 1124  BP: 135/80  Pulse: 88  Temp: 97.7 F (36.5 C)  TempSrc: Oral  SpO2: 97%  Weight: (!) 466 lb (211.4 kg)  Height: 6' (1.829 m)    Constitutional: Morbidly obese, NAD, appears comfortable Cardiovascular: RRR, no murmurs, rubs, or gallops.  Pulmonary/Chest: Limited due to habitus but CTAB Extremities: Warm and well perfused. Distal pulses intact. Right foot with circumferential swelling and non pitting edema. Tender to palpation over his 4th and 5th metatarsal. Healing blister near the medial aspect.  Skin: No rashes or erythema  Psychiatric: Normal mood and affect  Assessment & Plan:   See Encounters Tab for problem based charting.  Patient seen with Dr. Oswaldo DoneVincent

## 2016-08-20 NOTE — Assessment & Plan Note (Addendum)
Patient is here for follow-up of his chronic right foot pain. Etiology is multifactorial. 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 Patient was evaluated by orthopedic surgery and managed non surgically with IV antibiotics. He was discharged on a course of oral doxycycline for 2 weeks. Ortho felt patient was high risk for internal fixation and was placed in a short leg cast for fibrous union of his 4th metatarsal fracture. Patient reports the boot was too small and he developed a skin ulcer after wearing it for 1-2 days.   Unfortunately, pain management has been an on-going issue with this patient. He has a history of opioid use disorder, escalating his opioid dose in the past, and obtaining pain medicine illicitly. He was previously following with a pain clinic for other chronic pain issues but his percocet was discontinued due to violations of his pain contract. He subsequently went into withdrawal and continued to obtain it illegally to treat his withdrawal symptoms. He was later started on maintenance therapy with suboxone by Dr. Midge AverPavelock. Patient reports losing insurance coverage and is now off suboxone. Since being discharged from the hospital, pain has been uncontrolled on oxycodone-acetaminophen 10-325. He has run out of multiple prescriptions early and admits to taking more than prescribed. His last prescription was written on 08/11/2016 for percocet 10-325 q8 hours #40 tabs and meant to last him until 08/21/16. He admitted to escalating his dose over the past week to the point where he was taking 5 tablets at once. Despite this, he continued to have on-going uncontrolled pain and stopped the medicine all together. He denied any withdrawal symptoms. He then handed his prescription over to his mother to manage and distribute because he did not trust himself. Patient does have  good insight into his opioid use disorder, unfortunately has poor coping mechanisms to deal with his pain. We discussed multiple options for pain management including restarting suboxone, referral to methadone clinic, or re-referral to another pain clinic. Patient did not feel that suboxone was effective and would like to avoid pain clinics if possible. He is very resistant to the idea of starting methadone. We discussed that as an internal medicine clinic, we cannot escalate his pain regimen further than what he is already taking. Patient expressed understanding. We provided him with another 2 week supply of percocet 10-325 mg q8 hours (#42 tablets) with plans for close follow up. He is scheduled to follow up with Dr. Lajoyce Cornersuda on Monday. We encouraged him to keep this appointment.  -- UDS today -- Percocet 10-325 q8 hours (#42)  -- Follow up 2 weeks with PCP  -- Follow up Dr. Lajoyce Cornersuda   ADDENDUM: USD inappropriate, positive for Oxycodone (expected), THS (Unexpected), and Buprenorphine (unexpected). Patient has known opioid use disorder with multiple on-going red flags. Follow up with PCP for discussion of pain management.

## 2016-08-23 LAB — TOXASSURE SELECT,+ANTIDEPR,UR

## 2016-08-25 ENCOUNTER — Ambulatory Visit (INDEPENDENT_AMBULATORY_CARE_PROVIDER_SITE_OTHER): Payer: Self-pay | Admitting: Orthopedic Surgery

## 2016-08-28 ENCOUNTER — Encounter (INDEPENDENT_AMBULATORY_CARE_PROVIDER_SITE_OTHER): Payer: Self-pay | Admitting: Orthopedic Surgery

## 2016-08-28 ENCOUNTER — Ambulatory Visit (INDEPENDENT_AMBULATORY_CARE_PROVIDER_SITE_OTHER): Payer: Self-pay | Admitting: Orthopedic Surgery

## 2016-08-28 VITALS — Ht 72.0 in | Wt >= 6400 oz

## 2016-08-28 DIAGNOSIS — S92901K Unspecified fracture of right foot, subsequent encounter for fracture with nonunion: Secondary | ICD-10-CM

## 2016-08-28 DIAGNOSIS — E1165 Type 2 diabetes mellitus with hyperglycemia: Secondary | ICD-10-CM

## 2016-08-28 NOTE — Progress Notes (Signed)
Office Visit Note   Patient: Brian Day           Date of Birth: 1980/07/14           MRN: [id number] Visit Date: 08/28/2016              Requested by: Gust Rung, DO [address]  [place], [place] [address] PCP: Gust Rung, DO  Chief Complaint  Patient presents with  . 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 Patient states he is taking Percocet for pain. Patient states he is unable to wear the cast is placed in a fracture boot and he states the fracture boot cut into his foot due to his swelling. Patient shows up today full weightbearing with only a sock and a cane for ambulatory assistance. Patient states he can't use crutches or wheelchair or a walker.  Assessment & Plan: Visit Diagnoses:  1. Nonunion of foot fracture, right   2. Morbid obesity (HCC)   3. Uncontrolled type 2 diabetes mellitus with hyperglycemia, without long-term current use of insulin (HCC)     Plan: We will place the patient a postoperative shoe. Discussed the importance of nonweightbearing. Discussed that due to the massive swelling in his foot uncontrolled type 2 diabetes and microcirculatory disease open reduction internal fixation would have an increased risk of the wound not healing and potential for amputation. We will continue conservative care.  Three-view radiographs of the right foot at follow-up.  Follow-Up Instructions: Return in about 4 weeks (around 09/25/2016).   Ortho Exam  Patient is alert, oriented, no adenopathy, well-dressed, normal affect, normal respiratory effort. Patient has an antalgic gait and uses a cane. BMI greater than 63. Patient's foot is massively swollen and edematous as well as his leg. There are no open ulcers. Patient has tenderness to palpation. Previous radiographs shows a fibrous nonunion of the fourth metatarsal. Most recent hemoglobin A1c  10.9  Imaging: No results found.  Labs: Lab Results  Component Value Date   HGBA1C 10.9 (H) 07/15/2016   HGBA1C 10.8 (H) 07/15/2016   HGBA1C 6.9% 11/23/2012   ESRSEDRATE 113 (H) 07/15/2016   CRP 10.6 (H) 07/15/2016   LABURIC 3.2 (L) 07/21/2016   REPTSTATUS 07/20/2016 FINAL 07/15/2016   CULT NO GROWTH 5 DAYS 07/15/2016    Orders:  No orders of the defined types were placed in this encounter.  No orders of the defined types were placed in this encounter.    Procedures: No procedures performed  Clinical Data: No additional findings.  ROS:  All other systems negative, except as noted in the HPI. Review of Systems  Objective: Vital Signs: Ht 6' (1.829 m)   Wt (!) 466 lb (211.4 kg)   BMI 63.20 kg/m   Specialty Comments:  No specialty comments available.  PMFS History: Patient Active Problem List   Diagnosis Date Noted  . 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 Idiopathic chronic gout of right ankle without tophus   . Chronic pain in right foot 07/15/2016  . Diabetic peripheral neuropathy (HCC) 12/29/2013  . Benign essential hypertension 10/27/2013  . Hypokalemia 11/23/2012  . Uncontrolled type 2 diabetes mellitus with hyperglycemia, without long-term current use of insulin (HCC) 11/23/2012  . Morbid obesity (HCC) 11/23/2012  . Low back pain 11/23/2012  . Fungal infection of skin of abdomen 11/23/2012  Past Medical History:  Diagnosis Date  . Addiction, opium (HCC)   . Anxiety   . Arthritis    "right knee" (07/15/2016)  . CAP (community acquired pneumonia) 05/2014   hx/notes 06/07/2014  . Depression   . GERD (gastroesophageal reflux disease)   . Hypertension   . Migraine    "once q 3-4 years" (07/15/2016)  . Morbid obesity (HCC)   . Type II diabetes mellitus (HCC)     Family History  Problem Relation Age of Onset  . Diabetes Mother   . Hypertension Mother   . Hypertension Father     Past Surgical History:   Procedure Laterality Date  . KNEE ARTHROSCOPY Right 2000s   Social History   Occupational History  . Not on file.   Social History Main Topics  . Smoking status: Current Every Day Smoker    Packs/day: 1.00    Years: 12.00    Types: Cigarettes  . Smokeless tobacco: Former NeurosurgeonUser    Types: Snuff, Chew     Comment: 07/15/2016 "quit chew/snuff in my early 20s"  . Alcohol use No  . Drug use: No     Comment: 07/15/2016 "nothing since 05/27/2016; was abusing pain pills"  . Sexual activity: No

## 2016-09-02 ENCOUNTER — Ambulatory Visit (INDEPENDENT_AMBULATORY_CARE_PROVIDER_SITE_OTHER): Payer: Medicaid Other | Admitting: Internal Medicine

## 2016-09-02 VITALS — BP 163/99 | HR 80 | Temp 97.8°F | Ht 74.0 in | Wt >= 6400 oz

## 2016-09-02 DIAGNOSIS — G8921 Chronic pain due to trauma: Secondary | ICD-10-CM | POA: Diagnosis not present

## 2016-09-02 DIAGNOSIS — I10 Essential (primary) hypertension: Secondary | ICD-10-CM | POA: Diagnosis not present

## 2016-09-02 DIAGNOSIS — M79671 Pain in right foot: Secondary | ICD-10-CM

## 2016-09-02 DIAGNOSIS — F17210 Nicotine dependence, cigarettes, uncomplicated: Secondary | ICD-10-CM

## 2016-09-02 DIAGNOSIS — E119 Type 2 diabetes mellitus without complications: Secondary | ICD-10-CM | POA: Diagnosis not present

## 2016-09-02 DIAGNOSIS — F331 Major depressive disorder, recurrent, moderate: Secondary | ICD-10-CM

## 2016-09-02 DIAGNOSIS — Z6841 Body Mass Index (BMI) 40.0 and over, adult: Secondary | ICD-10-CM

## 2016-09-02 DIAGNOSIS — Z79899 Other long term (current) drug therapy: Secondary | ICD-10-CM

## 2016-09-02 DIAGNOSIS — S92301D Fracture of unspecified metatarsal bone(s), right foot, subsequent encounter for fracture with routine healing: Secondary | ICD-10-CM | POA: Diagnosis not present

## 2016-09-02 DIAGNOSIS — Z79891 Long term (current) use of opiate analgesic: Secondary | ICD-10-CM | POA: Insufficient documentation

## 2016-09-02 DIAGNOSIS — G8929 Other chronic pain: Secondary | ICD-10-CM

## 2016-09-02 DIAGNOSIS — X58XXXD Exposure to other specified factors, subsequent encounter: Secondary | ICD-10-CM

## 2016-09-02 MED ORDER — OXYCODONE-ACETAMINOPHEN 10-325 MG PO TABS: 1.0000 | ORAL_TABLET | Freq: Three times a day (TID) | ORAL | 0 refills | Status: DC | PRN

## 2016-09-02 MED ORDER — SERTRALINE HCL 100 MG PO TABS: 100.0000 mg | ORAL_TABLET | Freq: Every day | ORAL | 1 refills | Status: DC

## 2016-09-02 NOTE — Patient Instructions (Addendum)
To treat your pain and depression, I would like to work on transitioning you from Zoloft to Cymbalta. To start, go down to taking 100 mg of Zoloft again starting tomorrow.  After that, we will plan to have you go down to 50 mg of Zoloft and start Cymbalta. We'll talk more about this next week.  I have prescribed you Percocet for 1 more week. We will check your urine again today. Percocet is the only narcotic pain medicine that you should be taking. Even if you have more buprenorphine or anything else, do not take it anymore. If unexpected drugs or medicines show up in your urine again, we will not continue to prescribe narcotics.

## 2016-09-02 NOTE — Assessment & Plan Note (Addendum)
  [facility] 09/02/2016 08/19/2016 08/11/2016 07/31/2016  Decreased Interest 3 0 3 0  Down, Depressed, Hopeless 3 3 3 1   PHQ - 2 Score 6 3 6 1   Altered sleeping 2 2 3  -  Tired, decreased energy 3 3 3  -  Change in appetite 2 0 0 -  Feeling bad or failure about yourself  2 0 0 -  Trouble concentrating 1 3 3  -  Moving slowly or fidgety/restless 1 3 3  -  Suicidal thoughts 0 - 0 -  PHQ-9 Score 17 14 18  -  Difficult doing work/chores Somewhat difficult - Not difficult at all -    Current medications: Sertraline 150 mg daily  Previous medications: Mirtazapine  Assessment Active major depression with moderately severe symptoms.  Plan Transition from SSRI to SN RI 4 treatment failure of depression as well as treatment of chronic pain.  Medications:  -Decrease sertraline to 100 mg daily for 1 week -Plan to decrease to 50 mg daily and start duloxetine 30 mg daily following week -Then discontinue sertraline and increased duloxetine to 60 mg daily  Other:

## 2016-09-02 NOTE — Assessment & Plan Note (Addendum)
On 5/29 he was prescribed 42 tablets of Percocet 10-325 to last 2 weeks.  His UDS was inappropriate at that visit, with unexpected presence of THC and buprenorphine.  Reports no marijuana in 6 months, and stopped buprenorphine about 1.5 weeks prior to his last visit.  He now says that he still has Zubsolv from his former pain clinic, and that he has been taking intermittently, most recently about 2 days ago when his pain was bad. He denies marijuana use in the last 4-5 months, is not sure how his UDS could have THC in it.  He has been evaluated by orthopedic surgery, most recently last week.  He is being managed conservatively for his fourth metatarsal fracture due to high risk with his uncontrolled diabetes, noncompliance with nonweightbearing, and edema.  Discussed that even if he has other narcotics from prior providers, he should only take prescriptions that are currently prescribed from this clinic and that everything was detected in his urine we will no longer prescribe narcotics.  Review of the [facility] shows that he filled #15 ZUBSOLV 8.6- 2.1 MG TABLETS prescription on 08/23/2016 prescribed by Dr Nicolasa Duckingichard Pavelock on 07/07/2016.  A/P 36 year old man with history of opiate use disorder and multiple red flags for inappropriate opiate use.  Will reduce duration of prescriptions and increase oversight, warned that further prescribing opiates is in jeopardy.  -UDS today -1 week prescription of Percocet 10-325 #21 -Return to clinic 1 week for UDS and follow-up -If UDS is inappropriate at that point, no further prescriptions

## 2016-09-02 NOTE — Assessment & Plan Note (Signed)
Chronic pain in right foot from fourth metatarsal fracture not amenable to surgical repair due to his uncontrolled diabetes, noncompliance with nonweightbearing status, and edema.  He has been noncompliant with orthopedic surgery's recommendations for conservative management, most recently including wearing postoperative shoe.  -Continue Percocet 10-325 mg every 8 hours when necessary -Continued opiate prescriptions are in jeopardy with his multiple red flag symptoms for inappropriate opiate use -Continue meloxicam and gabapentin -Follow up with orthopedic surgery

## 2016-09-02 NOTE — Progress Notes (Signed)
   CC: "My foot is still killing me. I'm not even sure why I'm going to the orthopedic surgeon to do anything."  HPI:  [name] is a 36 y.o. man with history of diabetes, hypertension, opiate use disorder, and subacute right metatarsal fracture presents for management of right foot pain.   Past Medical History:  Diagnosis Date  . Addiction, opium (HCC)   . Anxiety   . Arthritis    "right knee" (07/15/2016)  . CAP (community acquired pneumonia) 05/2014   hx/notes 06/07/2014  . Depression   . GERD (gastroesophageal reflux disease)   . Hypertension   . Migraine    "once q 3-4 years" (07/15/2016)  . Morbid obesity (HCC)   . Type II diabetes mellitus (HCC)     Review of Systems:   Review of Systems  Constitutional: Negative for chills and fever.  Musculoskeletal: Positive for back pain and joint pain.  Psychiatric/Behavioral: Positive for depression. The patient is nervous/anxious.    Physical Exam:  Vitals:   09/02/16 1453  BP: (!) 163/99  Pulse: 80  Temp: 97.8 F (36.6 C)  TempSrc: Oral  SpO2: 100%  Weight: (!) 469 lb (212.7 kg)  Height: 6\' 2"  (1.88 m)   Physical Exam  Constitutional: He is oriented to person, place, and time.  Morbidly obese young man sitting in wheelchair with cane across his lap in no distress. Wearing street shoes.  Cardiovascular: Normal rate and regular rhythm.   Pulmonary/Chest: Effort normal and breath sounds normal.  Musculoskeletal:  Right lower extremity with 1+ pitting edema to above the ankle Right foot with diffuse lateral tenderness to palpation  Neurological: He is alert and oriented to person, place, and time.  Psychiatric: He has a normal mood and affect. His behavior is normal.     Assessment & Plan:   See Encounters Tab for problem based charting.  Patient discussed with Dr. Criselda PeachesMullen

## 2016-09-03 ENCOUNTER — Telehealth: Payer: Self-pay | Admitting: Pharmacist

## 2016-09-03 NOTE — Progress Notes (Signed)
Internal Medicine Clinic Attending  Case discussed with Dr. O'Sullivan at the time of the visit.  We reviewed the resident's history and exam and pertinent patient test results.  I agree with the assessment, diagnosis, and plan of care documented in the resident's note. 

## 2016-09-03 NOTE — Addendum Note (Signed)
Addended by: Debe CoderMULLEN, Christie Copley B on: 09/03/2016 03:00 PM   Modules accepted: Level of Service

## 2016-09-08 LAB — TOXASSURE SELECT,+ANTIDEPR,UR

## 2016-09-08 NOTE — Progress Notes (Signed)
I have been trying to reach patient to help with medications, but was only able to contact him today. Spoke to his mother who states patient was able to obtain insulin. [facility], advised his mother to notify clinic (can ask for me) if further medication concerns arise. She verbalized understanding by repeat back.

## 2016-09-09 ENCOUNTER — Encounter: Payer: Self-pay | Admitting: Internal Medicine

## 2016-09-09 ENCOUNTER — Ambulatory Visit (INDEPENDENT_AMBULATORY_CARE_PROVIDER_SITE_OTHER): Payer: Medicaid Other | Admitting: Internal Medicine

## 2016-09-09 VITALS — BP 148/91 | HR 87 | Temp 98.2°F | Ht 72.0 in | Wt >= 6400 oz

## 2016-09-09 DIAGNOSIS — Z79891 Long term (current) use of opiate analgesic: Secondary | ICD-10-CM

## 2016-09-09 DIAGNOSIS — Z79899 Other long term (current) drug therapy: Secondary | ICD-10-CM | POA: Diagnosis not present

## 2016-09-09 DIAGNOSIS — Z6841 Body Mass Index (BMI) 40.0 and over, adult: Secondary | ICD-10-CM

## 2016-09-09 DIAGNOSIS — I10 Essential (primary) hypertension: Secondary | ICD-10-CM

## 2016-09-09 DIAGNOSIS — S92301D Fracture of unspecified metatarsal bone(s), right foot, subsequent encounter for fracture with routine healing: Secondary | ICD-10-CM | POA: Diagnosis not present

## 2016-09-09 DIAGNOSIS — F331 Major depressive disorder, recurrent, moderate: Secondary | ICD-10-CM

## 2016-09-09 DIAGNOSIS — X58XXXD Exposure to other specified factors, subsequent encounter: Secondary | ICD-10-CM

## 2016-09-09 DIAGNOSIS — E119 Type 2 diabetes mellitus without complications: Secondary | ICD-10-CM | POA: Diagnosis not present

## 2016-09-09 DIAGNOSIS — M79671 Pain in right foot: Secondary | ICD-10-CM | POA: Diagnosis not present

## 2016-09-09 DIAGNOSIS — G8921 Chronic pain due to trauma: Secondary | ICD-10-CM

## 2016-09-09 DIAGNOSIS — G8929 Other chronic pain: Secondary | ICD-10-CM

## 2016-09-09 MED ORDER — OXYCODONE-ACETAMINOPHEN 5-325 MG PO TABS: 1.0000 | ORAL_TABLET | Freq: Four times a day (QID) | ORAL | 0 refills | Status: DC | PRN

## 2016-09-09 MED ORDER — KETOROLAC TROMETHAMINE 30 MG/ML IJ SOLN
30.0000 mg | Freq: Once | INTRAMUSCULAR | Status: AC
  Administered 2016-09-09: 30 mg via INTRAMUSCULAR

## 2016-09-09 NOTE — Progress Notes (Signed)
   CC: "At least I found some shoes that can stretch to fit over my feet."  HPI:  [name] is a 36 y.o. man with history of diabetes, hypertension, opiate use disorder, and subacute right metatarsal fracture presents for management of right foot pain.   Past Medical History:  Diagnosis Date  . Addiction, opium (HCC)   . Anxiety   . Arthritis    "right knee" (07/15/2016)  . CAP (community acquired pneumonia) 05/2014   hx/notes 06/07/2014  . Depression   . GERD (gastroesophageal reflux disease)   . Hypertension   . Migraine    "once q 3-4 years" (07/15/2016)  . Morbid obesity (HCC)   . Type II diabetes mellitus (HCC)     Review of Systems:   Review of Systems  Constitutional: Negative for chills and fever.  Cardiovascular: Negative for chest pain and palpitations.  Musculoskeletal: Positive for joint pain. Negative for falls.     Physical Exam:  Vitals:   09/09/16 1445  BP: (!) 148/91  Pulse: 87  Temp: 98.2 F (36.8 C)  TempSrc: Oral  SpO2: 97%  Weight: (!) 478 lb 1.6 oz (216.9 kg)  Height: 6' (1.829 m)   Physical Exam  Constitutional: He is oriented to person, place, and time.  Morbidly obese young man in no distress  Cardiovascular: Normal rate and regular rhythm.   Pulmonary/Chest: Effort normal and breath sounds normal.  Musculoskeletal:  Right greater than left foot and ankle edema  Neurological: He is alert and oriented to person, place, and time.  Psychiatric: He has a normal mood and affect. His behavior is normal.     Assessment & Plan:   See Encounters Tab for problem based charting.  Patient discussed with Dr. Oswaldo DoneVincent

## 2016-09-09 NOTE — Patient Instructions (Signed)
For your pain, we will give you a Toradol shot today.  We will check your urine again today and start decreasing your Percocet dose with a goal of being off of it in 2 weeks.  I have put in a referral to see a podiatrist to get a second opinion about the fracture in your foot.  I will also refer you to a pain clinic in case they have any other options for managing your pain long-term.

## 2016-09-10 MED ORDER — DULOXETINE HCL 30 MG PO CPEP: 30.0000 mg | ORAL_CAPSULE | Freq: Every day | ORAL | 0 refills | Status: DC

## 2016-09-10 MED ORDER — SERTRALINE HCL 100 MG PO TABS: 50.0000 mg | ORAL_TABLET | Freq: Every day | ORAL | 1 refills | Status: DC

## 2016-09-10 NOTE — Assessment & Plan Note (Addendum)
Discussed last weeks' UDS with the inappropriate absence of oxycodone. He reiterates that he is taking it as prescribed, may be ran out one day early.  Says that this past week he has been taking the oxycodone as prescribed, no buprenorphrine and no marijuana.  Discussed that due to clinic policy further inappropriate UDS is will result in us being unable to prescribe further narcotics for him. Also discussed that indication for acute opioid therapy for his right foot fracture is subsiding, and that the internal medicine Center is not prepared to prescribe him chronic opiates.  Plan is to taper his opiate prescription.  -Taper oxycodone from Percocet 10 every 8 hours to Percocet 5 every 6 -One-week prescription, follow-up next week with repeat UDS -No further narcotics prescriptions further inappropriate UDS -Plan to taper and discontinue narcotics over the next month -Refer to pain clinic ([facility] [place])

## 2016-09-10 NOTE — Progress Notes (Signed)
Internal Medicine Clinic Attending  Case discussed with Dr. Peggyann Juba'Sullivan at the time of the visit.  We reviewed the resident's history and exam and pertinent patient test results.  I agree with the assessment, diagnosis, and plan of care documented in the resident's note.  This is a tough case of a young man with a subacute pain generator and opioid misuse complicated by depression. I reviewed his urine tox results with Dr. Peggyann Juba'Sullivan who also reviewed them with the patient. Brian RuizJohn is not a good candidate for chronic opioids, and this pain generator should not be long term. Ideally, we will down titrate the opioids over the coming weeks, and should be fine coming off them within a month. If today's urine tox returns inappropriate, I think [facility] should stop providing oxycodone. If in that case he is worried about withdrawal, we can provide suboxone.

## 2016-09-10 NOTE — Assessment & Plan Note (Addendum)
He continues to have severe right foot pain which has not improved, though he thinks that the Percocet makes it somewhat easier to get around.   -Toradol 30 mg today -Oxycodone acetaminophen 5-325 mq Q6H for 1 week -Start duloxetine 30 mg daily -Return to clinic 1 week for UDS, opiate taper, and uptitration of SNRI -Refer to podiatry for second opinion -Refer to pain clinic ([facility] [place] [facility] [place] [facility] [place], and he said he would like to return there)

## 2016-09-10 NOTE — Assessment & Plan Note (Addendum)
  [facility] 09/09/2016 09/02/2016 08/19/2016 08/11/2016 07/31/2016  Decreased Interest 3 3 0 3 0  Down, Depressed, Hopeless 3 3 3 3 1   PHQ - 2 Score 6 6 3 6 1   Altered sleeping 2 2 2 3  -  Tired, decreased energy 3 3 3 3  -  Change in appetite 2 2 0 0 -  Feeling bad or failure about yourself  2 2 0 0 -  Trouble concentrating 1 1 3 3  -  Moving slowly or fidgety/restless 1 1 3 3  -  Suicidal thoughts 0 0 - 0 -  PHQ-9 Score 17 17 14 18  -  Difficult doing work/chores - Somewhat difficult - Not difficult at all -   ADDENDUM 09/10/2016 During the visit I neglected to follow-up on the plan to transition from sertraline to duloxetine for depression and pain.  I called him this morning to discuss medication management.  Current medications: Sertraline 100 mg daily  Previous medications: Mirtazapine  Assessment Active major depression with moderately severe symptoms.  Plan Transition from SSRI to SNRI for treatment of depression and chronic pain.  Medications:  -Decrease sertraline to 50 mg daily -Start duloxetine 30 mg daily -Plan to discontinue sertraline and increased duloxetine to 60 mg daily next week   Other:

## 2016-09-15 ENCOUNTER — Telehealth: Payer: Self-pay

## 2016-09-15 LAB — TOXASSURE SELECT,+ANTIDEPR,UR

## 2016-09-15 NOTE — Telephone Encounter (Signed)
Pt states he needs refills, 1 med has not been prescribed in clinic as of yet, ask to discuss at appt 6/26, he is agreeable

## 2016-09-15 NOTE — Telephone Encounter (Signed)
Needs to speak with a nurse about meds.  

## 2016-09-16 ENCOUNTER — Ambulatory Visit (INDEPENDENT_AMBULATORY_CARE_PROVIDER_SITE_OTHER): Payer: Medicaid Other | Admitting: Student in an Organized Health Care Education/Training Program

## 2016-09-16 VITALS — BP 154/97 | HR 103 | Temp 98.0°F | Ht 74.0 in | Wt >= 6400 oz

## 2016-09-16 DIAGNOSIS — Z91120 Patient's intentional underdosing of medication regimen due to financial hardship: Secondary | ICD-10-CM | POA: Diagnosis not present

## 2016-09-16 DIAGNOSIS — F17210 Nicotine dependence, cigarettes, uncomplicated: Secondary | ICD-10-CM | POA: Diagnosis not present

## 2016-09-16 DIAGNOSIS — Z79899 Other long term (current) drug therapy: Secondary | ICD-10-CM

## 2016-09-16 DIAGNOSIS — Z794 Long term (current) use of insulin: Secondary | ICD-10-CM

## 2016-09-16 DIAGNOSIS — E1165 Type 2 diabetes mellitus with hyperglycemia: Secondary | ICD-10-CM

## 2016-09-16 DIAGNOSIS — Z79891 Long term (current) use of opiate analgesic: Secondary | ICD-10-CM | POA: Diagnosis not present

## 2016-09-16 DIAGNOSIS — E11610 Type 2 diabetes mellitus with diabetic neuropathic arthropathy: Secondary | ICD-10-CM | POA: Diagnosis not present

## 2016-09-16 DIAGNOSIS — Z598 Other problems related to housing and economic circumstances: Secondary | ICD-10-CM

## 2016-09-16 DIAGNOSIS — F329 Major depressive disorder, single episode, unspecified: Secondary | ICD-10-CM | POA: Diagnosis not present

## 2016-09-16 DIAGNOSIS — I10 Essential (primary) hypertension: Secondary | ICD-10-CM

## 2016-09-16 DIAGNOSIS — Z6841 Body Mass Index (BMI) 40.0 and over, adult: Secondary | ICD-10-CM

## 2016-09-16 DIAGNOSIS — F331 Major depressive disorder, recurrent, moderate: Secondary | ICD-10-CM

## 2016-09-16 DIAGNOSIS — L918 Other hypertrophic disorders of the skin: Secondary | ICD-10-CM | POA: Diagnosis not present

## 2016-09-16 DIAGNOSIS — S92341G Displaced fracture of fourth metatarsal bone, right foot, subsequent encounter for fracture with delayed healing: Secondary | ICD-10-CM | POA: Diagnosis not present

## 2016-09-16 DIAGNOSIS — X58XXXD Exposure to other specified factors, subsequent encounter: Secondary | ICD-10-CM

## 2016-09-16 DIAGNOSIS — E1142 Type 2 diabetes mellitus with diabetic polyneuropathy: Secondary | ICD-10-CM

## 2016-09-16 MED ORDER — INSULIN DETEMIR 100 UNIT/ML ~~LOC~~ SOLN: 84.0000 [IU] | Freq: Every day | SUBCUTANEOUS | 11 refills | Status: DC

## 2016-09-16 MED ORDER — GABAPENTIN 300 MG PO CAPS: 900.0000 mg | ORAL_CAPSULE | Freq: Three times a day (TID) | ORAL | 0 refills | Status: DC

## 2016-09-16 MED ORDER — INSULIN ASPART 100 UNIT/ML ~~LOC~~ SOLN: SUBCUTANEOUS | 3 refills | Status: DC

## 2016-09-16 MED ORDER — HYDROCHLOROTHIAZIDE 25 MG PO TABS: 25.0000 mg | ORAL_TABLET | Freq: Every day | ORAL | 5 refills | Status: DC

## 2016-09-16 MED ORDER — OXYCODONE-ACETAMINOPHEN 5-325 MG PO TABS: 1.0000 | ORAL_TABLET | Freq: Three times a day (TID) | ORAL | 0 refills | Status: DC | PRN

## 2016-09-16 MED ORDER — GLIPIZIDE 5 MG PO TABS: 2.5000 mg | ORAL_TABLET | Freq: Every day | ORAL | 1 refills | Status: DC

## 2016-09-16 MED ORDER — METFORMIN HCL 1000 MG PO TABS: 1000.0000 mg | ORAL_TABLET | Freq: Two times a day (BID) | ORAL | 3 refills | Status: DC

## 2016-09-16 NOTE — Assessment & Plan Note (Signed)
Pedunculated 1.5 cm simple skin tag on his mid upper back which is inflammed and causes him pain. We were able to remove the skin tag today.   Shave Biopsy Procedure Note  Pre-operative Diagnosis: Pedunculated skin tag  Locations:upper back  Anesthesia: Lidocaine 1% without epinephrine  Procedure Details  History of allergy to lidocaine: no  Patient informed of the risks (including bleeding and infection) and benefits of the procedure and written informed consent obtained.  The lesion and surrounding area was prepped with an alcohol swab. A Dermablade was used to shave the skin tag.  Hemostasis achieved with silver nitrate. A sterile dressing was applied.  The specimen was not sent for pathologic examination. The patient tolerated the procedure well.  Condition: Stable  Complications: none.  Plan: 1. Instructed to keep the wound dry and covered for 24-48h and clean thereafter. 2. Patient instructed to apply Vaseline daily until healed. 3. Warning signs of infection were reviewed.

## 2016-09-16 NOTE — Assessment & Plan Note (Signed)
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 He has been able to increase weight bearing activities like walking, though I think Dr. Reuel Boomouda would like for him to be completely non-weight bearing. He has been using Percocet at high doses since discharge from the hospital in early May. He has a history of moderate opioid use disorder with abuse of prescription pain medications in the past, then treated for about two years with Zubsolv through Dr[facility]ock [facility]. [facility] to help manage his complex medical problems. He has had a few inappropriate UDS tests during his follow up. He ran out of percocet early a few weeks ago and self treated opioid withdrawal with an old prescription of Zubsolv, which showed up in his urine. He also has persistent THC which he says is left over from daily use 3 months ago, he thinks he has a large volume of distribution due to his obesity. He remains very worried about going into opioid withdrawal if he stop using percocet. He understands that his foot pain should not be a long term source of pain, and I do not think he is a good candidate for chronic short acting opioids.   Plan is to continue tapering Percocet. We will reduce 5mg  tab from q6 hours to q8 hours over the next week, I gave him #21 tabs for a 7 day supply. Once this subacute flare of pain is under control, he would like to restart Zubsolv to manage his OUD. He would prefer to do that with our clinic, rather than returning to [facility], which I think is fine. [facility] is appropriate (though it looks like they mistakenly attributed last dispense to a different provider also named Peggyann Juba'Sullivan). UDS obtained today. [facility] next Tuesday AM and we will discuss restarting  Zubsolv.

## 2016-09-16 NOTE — Patient Instructions (Signed)
Skin Tag, Adult A skin tag (acrochordon) is a soft, extra growth of skin. Most skin tags are flesh-colored and rarely bigger than a pencil eraser. They commonly form near areas where there are folds in the skin, such as the armpit or groin. Skin tags are not dangerous, and they do not spread from person to person (are not contagious). You may have one skin tag or several. Skin tags do not require treatment. However, your health care provider may recommend removal of a skin tag if it:  Gets irritated from clothing.  Bleeds.  Is visible and unsightly.  Your health care provider can remove skin tags with a simple surgical procedure or a procedure that involves freezing the skin tag. Follow these instructions at home:  Watch for any changes in your skin tag. A normal skin tag does not require any other special care at home.  Take over-the-counter and prescription medicines only as told by your health care provider.  Keep all follow-up visits as told by your health care provider. This is important. Contact a health care provider if:  You have a skin tag that: ? Becomes painful. ? Changes color. ? Bleeds. ? Swells.  You develop more skin tags. This information is not intended to replace advice given to you by your health care provider. Make sure you discuss any questions you have with your health care provider. Document Released: 03/25/2015 Document Revised: 11/04/2015 Document Reviewed: 03/25/2015 Elsevier Interactive Patient Education  2018 [employer]  

## 2016-09-16 NOTE — Assessment & Plan Note (Signed)
Depression symptoms worsened since being bed bound. Dr. Peggyann Juba'Sullivan intended for him to transition from sertraline to duloxetine to try to better treat his comorbid pain, but he has been unable to afford duloxetine. Plan is to continue sertraline 100mg  daily for now, and potentially can switch to duloxetine in the future when he gains insurance coverage.

## 2016-09-16 NOTE — Progress Notes (Signed)
Assessment and Plan:  See Encounters tab for problem-based medical decision making.   __________________________________________________________  HPI:  36 year old man here for follow up of right foot pain. He reports doing ok at home, made the transition to a lower dose of percocet well. Still spends most of his day in bed, he is non-weight bearing on the right. Unable to tolerate any of the boots Dr. Lajoyce Cornersuda has recommended. He feels his depression is getting worse because of inactivity, so he is walking some on the right foot. Reports pain is slowly improving. No fevers or chills. Eating plenty. Lives with his mother and father, moved back in with them about four months ago. They report observing him 24 hours a day, they even have cameras in their house which they installed years ago when he had a more severe opioid use disorder and was in a detox program. He also reports being out of almost all of his medications. He currently has no insurance, and says they are living pay check to pay check in order to pay our of pocket for his medications. He is working on [employer] med[place].   __________________________________________________________  Problem List: Patient Active Problem List   Diagnosis Date Noted  . Skin tag 09/16/2016  . Chronic prescription opiate use 09/02/2016  . 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 Idiopathic chronic gout of right ankle without tophus   . Chronic pain in right foot 07/15/2016  . Diabetic peripheral neuropathy (HCC) 12/29/2013  . Benign essential hypertension 10/27/2013  . Uncontrolled type 2 diabetes mellitus with hyperglycemia, without long-term current use of insulin (HCC) 11/23/2012  . Morbid obesity (HCC) 11/23/2012  . Low back pain 11/23/2012    Medications: Reconciled today in Epic __________________________________________________________  Physical Exam:  Vital Signs: Vitals:   09/16/16 1112  BP: (!)  154/97  Pulse: (!) 103  Temp: 98 F (36.7 C)  TempSrc: Oral  SpO2: 97%  Weight: (!) 465 lb (210.9 kg)  Height: 6\' 2"  (1.88 m)    Gen: Obese man, in a wheelchair, diaphoretic Neck: No cervical LAD, No thyromegaly or nodules, No JVD. CV: RRR, no murmurs Pulm: Normal effort, CTA throughout, no wheezing Ext: The right foot has only trace edema, there is no area or errythema, no signs of cellulitis. Skin: 1.5 cm simple pedunculated skin tag on his mid upper back. No atypical appearing moles. No rashes.

## 2016-09-16 NOTE — Assessment & Plan Note (Signed)
BP uncontrolled today. He reports being out of HCTZ, which I didn't see on his med list. Plan is to restart HCTZ 25 mg daily, follow up next week for BP recheck and BMP.

## 2016-09-16 NOTE — Assessment & Plan Note (Signed)
Home glucose reportedly elevated, readings consistently above 400. This makes sense with recent weight gain and sedentary status. Also unable to afford 70/30 insulin. Plan is to stop 70/30 and instead start long acting Levemir 84 units daily, and mealtime Novolog 12 units TID. Continue with metformin and glipizide. Dr. Selena BattenKim gave the patient vials of both forms of insulin which should last him about 10 days, he will follow up with Dr. Mikey BussingHoffman next week. Can either give another supply, or prescribe to his pharmacy.

## 2016-09-17 ENCOUNTER — Telehealth: Payer: Self-pay | Admitting: Internal Medicine

## 2016-09-17 ENCOUNTER — Other Ambulatory Visit: Payer: Self-pay | Admitting: Internal Medicine

## 2016-09-17 DIAGNOSIS — E1142 Type 2 diabetes mellitus with diabetic polyneuropathy: Secondary | ICD-10-CM

## 2016-09-17 NOTE — Telephone Encounter (Signed)
   Reason for call:   I received a call from Mr. Veronia BeetsJohn Malinak at 5:15 PM indicating he needs a refill on Gabapentin.   Pertinent Data:   Reports he saw Dr. Oswaldo DoneVincent yesterday for a PCP appointment and requested a refill for Gabapentin  He states his pharmacy does not have the prescription for this medication  He takes Gabapentin for his peripheral neuropathy   Assessment / Plan / Recommendations:   On medication list, see that Gabapentin was refilled yesterday and I informed patient of this.  Advised patient to call his pharmacy again to see if prescription available and if not, to call back and we would resend the prescription   Rivet, Iris Pertarly J, MD   09/17/2016, 5:25 PM

## 2016-09-18 ENCOUNTER — Other Ambulatory Visit: Payer: Self-pay | Admitting: Student in an Organized Health Care Education/Training Program

## 2016-09-18 MED ORDER — GABAPENTIN 300 MG PO CAPS: 900.0000 mg | ORAL_CAPSULE | Freq: Three times a day (TID) | ORAL | 1 refills | Status: DC

## 2016-09-18 NOTE — Telephone Encounter (Signed)
Looks like I re-ordered it but had No Print selected instead of Normal. I have re-sent.

## 2016-09-18 NOTE — Addendum Note (Signed)
Addended by: Erlinda HongVINCENT, Ismar Yabut T on: 09/18/2016 07:48 AM   Modules accepted: Orders

## 2016-09-20 LAB — TOXASSURE SELECT,+ANTIDEPR,UR

## 2016-09-23 ENCOUNTER — Telehealth: Payer: Self-pay | Admitting: *Deleted

## 2016-09-23 ENCOUNTER — Encounter: Payer: Self-pay | Admitting: Internal Medicine

## 2016-09-23 ENCOUNTER — Ambulatory Visit (INDEPENDENT_AMBULATORY_CARE_PROVIDER_SITE_OTHER): Payer: Medicaid Other | Admitting: Internal Medicine

## 2016-09-23 ENCOUNTER — Other Ambulatory Visit: Payer: Self-pay

## 2016-09-23 VITALS — HR 92 | Temp 98.0°F | Resp 20

## 2016-09-23 DIAGNOSIS — Z79899 Other long term (current) drug therapy: Secondary | ICD-10-CM | POA: Insufficient documentation

## 2016-09-23 DIAGNOSIS — Z79891 Long term (current) use of opiate analgesic: Secondary | ICD-10-CM

## 2016-09-23 DIAGNOSIS — G8929 Other chronic pain: Secondary | ICD-10-CM

## 2016-09-23 DIAGNOSIS — G8921 Chronic pain due to trauma: Secondary | ICD-10-CM

## 2016-09-23 DIAGNOSIS — M79671 Pain in right foot: Secondary | ICD-10-CM | POA: Diagnosis not present

## 2016-09-23 DIAGNOSIS — F1111 Opioid abuse, in remission: Secondary | ICD-10-CM

## 2016-09-23 MED ORDER — OMEPRAZOLE 20 MG PO TBEC: 20.0000 mg | DELAYED_RELEASE_TABLET | Freq: Every day | ORAL | 3 refills | Status: DC

## 2016-09-23 MED ORDER — OMEPRAZOLE 40 MG PO CPDR: 40.0000 mg | DELAYED_RELEASE_CAPSULE | Freq: Every day | ORAL | 3 refills | Status: DC

## 2016-09-23 MED ORDER — BUPRENORPHINE HCL-NALOXONE HCL 5.7-1.4 MG SL SUBL: 5.7000 mg | SUBLINGUAL_TABLET | Freq: Two times a day (BID) | SUBLINGUAL | 0 refills | Status: DC

## 2016-09-23 MED ORDER — OXYCODONE-ACETAMINOPHEN 5-325 MG PO TABS: 1.0000 | ORAL_TABLET | Freq: Two times a day (BID) | ORAL | 0 refills | Status: DC

## 2016-09-23 NOTE — Progress Notes (Signed)
Assessment and Plan:  See Encounters tab for problem-based medical decision making.   __________________________________________________________  HPI:  Reviewed previous note from Dr. Oswaldo Done 09/16/16 and other notes regarding opioid use.  Today Brian Day reports increased pain and swelling at the site of the foot fracture with nonunion.  He has not been non weight bearing as requested by Dr. Lajoyce Corners.  He has been trying to increase his activity slowly but has noticed increased swelling and pain in the right foot.  He reports that he was taking the oxycodone which was prescribed by our clinic and discovered an old prescription of zubsolv from his previous prescriber.  He notes that taking the two together was more helpful for the pain.    He has a new complaint of hip pain which is achy in nature, starts in his low back, buttock and moves into his leg, worse at night.  He gets some relief from his pain medications as noted above.    He feels more depressed since being asked to be more sedentary and has purposefully started being more active.  Hew as tearful during exam.  He was able to go fishing with his mother this week and he feels that was very helpful to his mood.  He is taking 150mg  sertraline for depression.    He is requesting information about weight loss medication since he cannot exercise.  We discussed this briefly and I informed him that we would need to discuss next time he was seen after I was able to do more investigation into his medical history.  He may be a candidate for phentermine, which would be the least expensive option.  The other options available would likely be cost prohibitive.    I reviewed his UDS with him over the phone as I neglected to in the clinic.  He reports that he accidentally took one dose of an old hydrocodone prescription and had run out of oxycodone on day of clinic appointment.  He further reports a history of daily use of Marijuana prior to moving in  to his mothers house about 3 months ago.  Feasibly, long term marijuana use could show up 30 days after cessation of use, but it has been there for about 3 months now.  Darick reports that this has happened to him before and he is not using THC.  I advised him to dispose of the hydrocodone and not mix both oxycodone and hydrocodone.  I advised him strongly to stick to current treatment plan.  UDS ordered for today.   __________________________________________________________  Problem List: Patient Active Problem List   Diagnosis Date Noted  . Skin tag 09/16/2016  . Chronic prescription opiate use 09/02/2016  . 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 Idiopathic chronic gout of right ankle without tophus   . Chronic pain in right foot 07/15/2016  . Diabetic peripheral neuropathy (HCC) 12/29/2013  . Benign essential hypertension 10/27/2013  . Uncontrolled type 2 diabetes mellitus with hyperglycemia, without long-term current use of insulin (HCC) 11/23/2012  . Morbid obesity (HCC) 11/23/2012  . Low back pain 11/23/2012    Medications: Reconciled today in Epic __________________________________________________________  Physical Exam:  Vital Signs: Vitals:   09/23/16 0838  Pulse: 92  Resp: 20  Temp: 98 F (36.7 C)  TempSrc: Oral  SpO2: 97%    Gen: Well appearing, NAD Pulm: Normal effort, No audible wheezing.  Ext: Warm, he has some pitting edema, 1+ from ankle to shin  on the right.  Demostrably larger than the left. He has TTP over the ankle.  Back: Some mild muscle spasm noted in the right lower back.  Skin: he has some reddening to the skin on his legs, no warmth or signs of infection at this time.    Assessment/Plan

## 2016-09-23 NOTE — Telephone Encounter (Signed)
This was done 6/28

## 2016-09-23 NOTE — Telephone Encounter (Signed)
SPOKE WITH HIS MOTHER (BETH). GAVE HER APPOINTMENT INFORMATION FOR [facility] [place].  July 27.018 ARRIVE 2:15PM. [address] / [place] .

## 2016-09-23 NOTE — Patient Instructions (Addendum)
[name] - -   Thank you for coming in to see me today.   For your pain - -  Take Oxycodone-tylenol twice a day for 1 week Take Zubsolv 5.7-1.4mg  twice a day for 1 week  Take Ibuprofen 600mg  (3 tab from over the counter), two to three times per day for the next week.   Try to start weightbearing on the right slowly.   Re start your omeprazole.  [facility]6 [facility].  Or you can get it over the counter.   Please call the clinic and ask for either myself or Dr. Oswaldo DoneVincent if you have any questions.    Thank you!

## 2016-09-23 NOTE — Assessment & Plan Note (Addendum)
This is the main issue he has today.  He further has new right sided hip pain.  I explained to him that his increased pain and swelling were likely due to his increased activity.  I reiterated to him that short acting narcotics were not a good long term solution for him.  We agreed to a plan of weaning his short acting narcotics to twice a day and starting him on buprenorphine-naloxone to avoid further withdrawal.  I also advised him to use 600mg  Ibuprofen BID-TID as an adjunct to the pain medication to help with anti-inflammatory properties.   This pain is designated as chronic as it is lasting longer than expected for normal healing of his known fracture.   Plan DECREASE Oxycodone-acetaminophen to BID dosing, #14 tabs given today Start Zubsolve 5.7-1.4 BID (previous dose from last pain clinic) Start Ibuprofen 600mg  BID to TID for 1 week Follow up in 1 week.   Reports a history of reflux - asked to restart omeprazole and Rx was provided.  He may have to get OTC.   I reiterated the plan to wean off of narcotics with plan for once daily dosing next week and then off.

## 2016-09-23 NOTE — Assessment & Plan Note (Signed)
He has been intermittently using Zubsolv from a previous prescriber to avoid withdrawal.  He notes cravings and issues with muscle aches, restlessness, anxiety, depressed mood.  I think he meets criteria for opioid use disorder, though he does not have classic symptoms at this time as he has been consistently using opioids throughout the last few months.   Plan Restart Zubsolv (he has nausea/vomiting with Suboxone) at 5.7-1.4 SL BID.    Return to clinic in 1 week.   He will need a medication agreement contract signed at that time.

## 2016-09-23 NOTE — Telephone Encounter (Signed)
gabapentin (NEURONTIN) 300 MG capsule, refill request

## 2016-09-25 ENCOUNTER — Ambulatory Visit (INDEPENDENT_AMBULATORY_CARE_PROVIDER_SITE_OTHER): Payer: Medicaid Other | Admitting: Internal Medicine

## 2016-09-25 ENCOUNTER — Encounter: Payer: Self-pay | Admitting: Internal Medicine

## 2016-09-25 VITALS — BP 142/86 | HR 95 | Temp 98.2°F | Ht 74.0 in | Wt >= 6400 oz

## 2016-09-25 DIAGNOSIS — Z6841 Body Mass Index (BMI) 40.0 and over, adult: Secondary | ICD-10-CM

## 2016-09-25 DIAGNOSIS — E1142 Type 2 diabetes mellitus with diabetic polyneuropathy: Secondary | ICD-10-CM | POA: Diagnosis not present

## 2016-09-25 DIAGNOSIS — Z79899 Other long term (current) drug therapy: Secondary | ICD-10-CM

## 2016-09-25 DIAGNOSIS — M159 Polyosteoarthritis, unspecified: Secondary | ICD-10-CM

## 2016-09-25 DIAGNOSIS — M150 Primary generalized (osteo)arthritis: Secondary | ICD-10-CM | POA: Diagnosis not present

## 2016-09-25 DIAGNOSIS — Z79891 Long term (current) use of opiate analgesic: Secondary | ICD-10-CM | POA: Diagnosis not present

## 2016-09-25 DIAGNOSIS — I10 Essential (primary) hypertension: Secondary | ICD-10-CM | POA: Diagnosis not present

## 2016-09-25 MED ORDER — DICLOFENAC SODIUM 75 MG PO TBEC: 75.0000 mg | DELAYED_RELEASE_TABLET | Freq: Two times a day (BID) | ORAL | 2 refills | Status: DC

## 2016-09-25 MED ORDER — GABAPENTIN 300 MG PO CAPS: 900.0000 mg | ORAL_CAPSULE | Freq: Three times a day (TID) | ORAL | 2 refills | Status: DC

## 2016-09-25 NOTE — Patient Instructions (Signed)
Keep working on weight loss!  I will look into seeing if we can get Victoza with patient assistance.

## 2016-09-29 ENCOUNTER — Ambulatory Visit (INDEPENDENT_AMBULATORY_CARE_PROVIDER_SITE_OTHER): Payer: Self-pay | Admitting: Orthopedic Surgery

## 2016-09-29 LAB — TOXASSURE SELECT,+ANTIDEPR,UR

## 2016-09-30 ENCOUNTER — Telehealth: Payer: Self-pay | Admitting: Internal Medicine

## 2016-09-30 ENCOUNTER — Ambulatory Visit (INDEPENDENT_AMBULATORY_CARE_PROVIDER_SITE_OTHER): Payer: Medicaid Other | Admitting: Student in an Organized Health Care Education/Training Program

## 2016-09-30 VITALS — BP 158/100 | HR 99 | Temp 97.4°F | Resp 22 | Wt >= 6400 oz

## 2016-09-30 DIAGNOSIS — Z79891 Long term (current) use of opiate analgesic: Secondary | ICD-10-CM | POA: Diagnosis not present

## 2016-09-30 DIAGNOSIS — G8929 Other chronic pain: Secondary | ICD-10-CM

## 2016-09-30 DIAGNOSIS — M79671 Pain in right foot: Secondary | ICD-10-CM

## 2016-09-30 MED ORDER — OXYCODONE-ACETAMINOPHEN 5-325 MG PO TABS: 1.0000 | ORAL_TABLET | Freq: Two times a day (BID) | ORAL | 0 refills | Status: DC | PRN

## 2016-09-30 NOTE — Telephone Encounter (Signed)
PATIENT CALLED TO APPLY FOR [facility], HE HAS A PENDING CASE FOR MEDICAID WHICH IS IN APPEALS, I CAN NOT TO NEW APP FOR [employer] UNTIL HE IS DENIED MEDICAID COVERAGE.

## 2016-09-30 NOTE — Progress Notes (Signed)
[facility] Subjective:  HPI: Brian Day is a 36 y.o. male who presents for follow up DM  Please see Assessment and Plan below for the status of his chronic medical problems.  Review of Systems: No fever/ chills No SOB No chest pain No polydispia or polyuria Objective:  Physical Exam: Vitals:   09/25/16 1103  BP: (!) 142/86  Pulse: 95  Temp: 98.2 F (36.8 C)  TempSrc: Oral  SpO2: 98%  Weight: (!) 512 lb (232.2 kg)  Height: 6\' 2"  (1.88 m)  Physical Exam  Constitutional: He is well-developed, well-nourished, and in no distress.  Cardiovascular: Normal rate and regular rhythm.   Pulmonary/Chest: Effort normal and breath sounds normal.  Abdominal: Soft.  Musculoskeletal: He exhibits no edema.  Nursing note and vitals reviewed.   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  Oxycodone was previously helping but this has been cut back recently. He also notes that he started 4 days ago on buprenorphrine.  He reports pain is constant and dull but sharp at times with movement.  He notes that he has taken 800mg  of Ibuprofen about 4 times a Day without much relief.  He also takes about 4000mg  of tylenol a Day without much relief.  In the past he has tried Naproxen as well as Mobic.  He has noted this have caused some mild GI upset.  He has not had any hematemesis or dark BM.  In addition he noes bilateral knee pain but is less severe than his right hip pain  A: OA of multiple joints  P: We discussed that continued Oxycodone for his chronic pain is not the best treatment strategy.  I have encouraged him to try a specific medication regimen, Overall I will need him to loose weight. STOP Ibuprofen and other NSAIDS.  Start Voltaren 75mg  BID May continue Tylenol must keep less than 4000mg  daily.  Morbid obesity (HCC) HPI: His weight is  increasing.  He is not sure why.  A: Morbid obesity  P:  I suspect his weight gain is related to better control of DM.  I have discussed the goal to get him started on a GLP-1 agonist if he can get patietn assistance or insurance which should help with some weight loss.  For now I would like him to start keeping a food journal and monitoring caloric intake.  Benign essential hypertension HPI: Taking HCTZ daily. No complaints  A: Essential HTN not at goal  P: Continue HCTZ 25mg  daily. If not at goal at next visit will add ACEi  Diabetic peripheral neuropathy (HCC) Refilled Gabapentin 900mg  TID, No complaints, no excessive sedation.   Medications Ordered Meds ordered this encounter  Medications  . diclofenac (VOLTAREN) 75 MG EC tablet    Sig: Take 1 tablet (75 mg total) by mouth 2 (two) times daily.    Dispense:  60 tablet    Refill:  2  . gabapentin (NEURONTIN) 300 MG capsule    Sig: Take 3 capsules (900 mg total) by mouth 3 (three) times daily.    Dispense:  270 capsule    Refill:  2   Other Orders No orders of the defined types were placed in this encounter.  Follow Up: Return 1-2 months.

## 2016-09-30 NOTE — Progress Notes (Signed)
   Assessment and Plan:  See Encounters tab for problem-based medical decision making.   __________________________________________________________  HPI:  36 year old man here for follow-up of chronic right foot pain. Patient has a complicated past history of opioid use disorder previously treated with Zubsolv by his prior primary care physician. We have been following him very closely since his admission in April for a right foot cellulitis and possible osteomyelitis. He completed antibiotics several weeks ago but has had persistent pain in the right foot. This pain has been treated with oxycodone which we have been de-escalating very slowly. Over the last 2 weeks the patient has had an increased sensation of pain coming from that right foot. Over the last week he reports very little ability to get out of bed. He was given a prescription for oxycodone by Dr. Criselda PeachesMullen and instructed to use it only 2 times daily. Unfortunately patient did take it 4 times daily, he said that's what needed for the pain. He subsequently ran out early and his last oxycodone was Friday. He was also given a prescription for Zubsolv because he was telling Dr. Criselda PeachesMullen that he was afraid of going into withdrawal. He is unable to fill his prescription because of the out-of-pocket cost, currently he is uninsured. He denies any fevers or chills. He also endorses right lateral hip pain. Denies any changes in the skin. Denies intermittent joint swelling. No other recent falls.  __________________________________________________________  Problem List: Patient Active Problem List   Diagnosis Date Noted  . 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 Other long term (current) drug therapy 09/23/2016  . Skin tag 09/16/2016  . Chronic prescription opiate use 09/02/2016  . 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 Idiopathic chronic gout of right ankle without tophus    . Chronic pain in right foot 07/15/2016  . Diabetic peripheral neuropathy (HCC) 12/29/2013  . Benign essential hypertension 10/27/2013  . Uncontrolled type 2 diabetes mellitus with hyperglycemia, without long-term current use of insulin (HCC) 11/23/2012  . Morbid obesity (HCC) 11/23/2012  . Low back pain 11/23/2012    Medications: Reconciled today in Epic __________________________________________________________  Physical Exam:  Vital Signs: Vitals:   09/30/16 1119  BP: (!) 158/100  Pulse: 99  Resp: (!) 22  Temp: (!) 97.4 F (36.3 C)  TempSrc: Oral  SpO2: 96%  Weight: (!) 497 lb 3.2 oz (225.5 kg)    Gen: Morbidly obese, chronically ill appearing man.  ENT: OP clear without erythema or exudate.  Ext: Warm, large right leg, mostly non-pitting edema, right ankle with good range of motion but illicits pain and rotation and flexion.  Skin: No atypical appearing moles. No rashes

## 2016-09-30 NOTE — Assessment & Plan Note (Signed)
Chronic right foot pain continues to be out of proportion to exam. I don't see any signs of further infection, fluid collection, ankle effusion on exam. I doubt new fracture. His edema is trace and likely chronic related to his morbid obesity. I agree with Dr. Criselda PeachesMullen and Dr. Mikey BussingHoffman that opioids are not a good long-term solution. I told the patient this and he understands. I also advised that I don't have a good explanation for his chronic pain, and I urged him to follow-up with Dr. Lajoyce Cornersuda soon. Seeing podiatry is also fine, but I was puzzled to hear that he pushed back his appointment with Dr. Lajoyce Cornersuda when he is experiencing so much pain. I gave the patient one prescription of Oxycodone 5mg  #14 tabs, same as last week, with clear instructions that this would be the final prescription for his flare of chronic foot pain. He is to use this sparingly, only for severe pain, and to use it to mobilize out of bed. For moderate pain he should use diclofenac and gabapentin. He should follow up with either Dr. Lajoyce Cornersuda or podiatry ASAP to continue investigating organic causes of this pain. He does not need to follow up again in the Suboxone clinic, as this is also not a good option for him long term.

## 2016-09-30 NOTE — Assessment & Plan Note (Signed)
Refilled Gabapentin 900mg  TID, No complaints, no excessive sedation.

## 2016-09-30 NOTE — Assessment & Plan Note (Signed)
HPI: His weight is increasing.  He is not sure why.  A: Morbid obesity  P:  I suspect his weight gain is related to better control of DM.  I have discussed the goal to get him started on a GLP-1 agonist if he can get patietn assistance or insurance which should help with some weight loss.  For now I would like him to start keeping a food journal and monitoring caloric intake.

## 2016-09-30 NOTE — Assessment & Plan Note (Signed)
Patient reports not taking any opioids for at least the last 3 days. I think he has demonstrated that he is low risk for going into withdrawal. She also showing no other signs of mild or moderate opioid use disorder. I do not think he is a good candidate for continuing Zubsolv, especially becaue he cannot afford the out of pocket cost.

## 2016-09-30 NOTE — Assessment & Plan Note (Signed)
HPI: Taking HCTZ daily. No complaints  A: Essential HTN not at goal  P: Continue HCTZ 25mg  daily. If not at goal at next visit will add ACEi

## 2016-09-30 NOTE — Assessment & Plan Note (Signed)
HPI: Patient reports mod-severe right hip pain that radiates down his leg.  Oxycodone was previously helping but this has been cut back recently. He also notes that he started 4 days ago on buprenorphrine.  He reports pain is constant and dull but sharp at times with movement.  He notes that he has taken 800mg  of Ibuprofen about 4 times a day without much relief.  He also takes about 4000mg  of tylenol a day without much relief.  In the past he has tried Naproxen as well as Mobic.  He has noted this have caused some mild GI upset.  He has not had any hematemesis or dark BM.  In addition he noes bilateral knee pain but is less severe than his right hip pain  A: OA of multiple joints  P: We discussed that continued Oxycodone for his chronic pain is not the best treatment strategy.  I have encouraged him to try a specific medication regimen, Overall I will need him to loose weight. STOP Ibuprofen and other NSAIDS.  Start Voltaren 75mg  BID May continue Tylenol must keep less than 4000mg  daily.

## 2016-10-01 ENCOUNTER — Encounter: Payer: Self-pay | Admitting: Pharmacist

## 2016-10-01 NOTE — Progress Notes (Signed)
Zubsolv patient assistance program status: Copy of patient's photo ID needed  Program representative states patient has been notified of missing information.

## 2016-10-07 ENCOUNTER — Telehealth: Payer: Self-pay | Admitting: Internal Medicine

## 2016-10-07 NOTE — Telephone Encounter (Signed)
NEEDS REFILL ON PAIN MEDS,

## 2016-10-07 NOTE — Telephone Encounter (Signed)
I will forward this over to Dr Criselda PeachesMullen.

## 2016-10-07 NOTE — Telephone Encounter (Signed)
He will need to be seen in clinic.  He was last evaluated by Dr. Oswaldo DoneVincent who reported no signs/symptoms of withdrawal.  He was not on zubsolv at the time, he is no longer being seen in the OUD clinic due to very mild withdrawal in the past.  I cannot assess whether this is withdrawal or another acute issue without being seen.

## 2016-10-07 NOTE — Telephone Encounter (Signed)
Patient's mother called to report patient is having sever withdrawal symptoms and needs Rx for zubsolv  Reports he is throwing up , having diarrhea, and aching all over since Sunday.

## 2016-10-07 NOTE — Telephone Encounter (Signed)
Thank you :)

## 2016-10-08 ENCOUNTER — Ambulatory Visit (INDEPENDENT_AMBULATORY_CARE_PROVIDER_SITE_OTHER): Payer: Medicaid Other | Admitting: Internal Medicine

## 2016-10-08 ENCOUNTER — Other Ambulatory Visit: Payer: Self-pay | Admitting: Pharmacist

## 2016-10-08 ENCOUNTER — Encounter: Payer: Self-pay | Admitting: Internal Medicine

## 2016-10-08 VITALS — BP 180/111 | HR 97 | Temp 98.1°F | Ht 75.0 in | Wt >= 6400 oz

## 2016-10-08 DIAGNOSIS — Z6841 Body Mass Index (BMI) 40.0 and over, adult: Secondary | ICD-10-CM | POA: Diagnosis not present

## 2016-10-08 DIAGNOSIS — E1165 Type 2 diabetes mellitus with hyperglycemia: Secondary | ICD-10-CM

## 2016-10-08 DIAGNOSIS — F17210 Nicotine dependence, cigarettes, uncomplicated: Secondary | ICD-10-CM | POA: Diagnosis not present

## 2016-10-08 DIAGNOSIS — M159 Polyosteoarthritis, unspecified: Secondary | ICD-10-CM | POA: Diagnosis not present

## 2016-10-08 DIAGNOSIS — Z79891 Long term (current) use of opiate analgesic: Secondary | ICD-10-CM

## 2016-10-08 DIAGNOSIS — E1142 Type 2 diabetes mellitus with diabetic polyneuropathy: Secondary | ICD-10-CM

## 2016-10-08 DIAGNOSIS — Z79899 Other long term (current) drug therapy: Secondary | ICD-10-CM

## 2016-10-08 DIAGNOSIS — G8929 Other chronic pain: Secondary | ICD-10-CM

## 2016-10-08 DIAGNOSIS — F331 Major depressive disorder, recurrent, moderate: Secondary | ICD-10-CM

## 2016-10-08 MED ORDER — ALLOPURINOL 100 MG PO TABS: 100.0000 mg | ORAL_TABLET | Freq: Two times a day (BID) | ORAL | 3 refills | Status: DC

## 2016-10-08 MED ORDER — BASAGLAR KWIKPEN 100 UNIT/ML ~~LOC~~ SOPN
84.0000 [IU] | PEN_INJECTOR | Freq: Every day | SUBCUTANEOUS | 3 refills | Status: DC
Start: 2016-10-08 — End: 2017-02-16

## 2016-10-08 MED ORDER — HYDROCHLOROTHIAZIDE 25 MG PO TABS: 25.0000 mg | ORAL_TABLET | Freq: Every day | ORAL | 3 refills | Status: DC

## 2016-10-08 MED ORDER — OXYCODONE-ACETAMINOPHEN 10-325 MG PO TABS: 1.0000 | ORAL_TABLET | Freq: Three times a day (TID) | ORAL | 0 refills | Status: DC | PRN

## 2016-10-08 MED ORDER — GABAPENTIN 300 MG PO CAPS: 900.0000 mg | ORAL_CAPSULE | Freq: Three times a day (TID) | ORAL | 3 refills | Status: DC

## 2016-10-08 MED ORDER — GLIPIZIDE 5 MG PO TABS: 2.5000 mg | ORAL_TABLET | Freq: Every day | ORAL | 3 refills | Status: DC

## 2016-10-08 MED ORDER — SERTRALINE HCL 100 MG PO TABS: 50.0000 mg | ORAL_TABLET | Freq: Every day | ORAL | 3 refills | Status: DC

## 2016-10-08 MED ORDER — OMEPRAZOLE 40 MG PO CPDR: 40.0000 mg | DELAYED_RELEASE_CAPSULE | Freq: Every day | ORAL | 3 refills | Status: DC

## 2016-10-08 MED ORDER — METFORMIN HCL 1000 MG PO TABS: 1000.0000 mg | ORAL_TABLET | Freq: Two times a day (BID) | ORAL | 3 refills | Status: DC

## 2016-10-08 MED ORDER — INSULIN LISPRO 100 UNIT/ML (KWIKPEN): 12.0000 [IU] | PEN_INJECTOR | Freq: Three times a day (TID) | SUBCUTANEOUS | 3 refills | Status: DC

## 2016-10-08 NOTE — Assessment & Plan Note (Signed)
His weight is likely a large contributor to his chronic pain, especially joint pain. He expresses an interest in bariatric surgery which may be more financially feasible as he continues to work through getting his Medicaid. Long term, this would likely be very beneficial and lessen the need for more intensive pain medications and therapies. Will put in a referral and give information regarding Bariatric surgery education sessions for the pt.

## 2016-10-08 NOTE — Assessment & Plan Note (Addendum)
He is on chronic medications including sertraline, HCTZ, metformin, and an insulin regimen, currently basal bolus though pt expresses a preference for 70/30 BID for fewer injections. He is no longer taking allopurinol, glipizide. He was recently approved for [facility] and will need original prescriptions sent to them, though this process seems to have been started by Dr. Selena BattenKim and Mikey BussingHoffman with pended orders for his medications. Will defer chronic medicine management as per pharmacy and PCP.

## 2016-10-08 NOTE — Patient Instructions (Signed)
Thank you for coming in today.   Your [facility] Medications should be straightened out soon and I will make sure the correct medications are sent by looking through what you were on in the hospital and in recent office visits.   We're prescribing a dose of percocet that has worked for you in the past. These should last you 2 weeks. We'll also make a referral for pain management clinic and Bariatric surgery to hopefully get that process started as you get Medicaid.

## 2016-10-08 NOTE — Progress Notes (Signed)
   CC: Chronic Pain   HPI:  [name] is a 36 y.o. M with past medical history as described below who presents to the clinic with concerns for opiod withdrawal.   [name] has a history of opiod use disorder and has been seen regularly for management of his chronic pain related to OA of multiple joints and recent foot complications including osteomyelitis, non-union fracture. He has recently been provided with two short term prescriptions of oxycodone 5-325 and told to use it sparingly to facilitate weaning of opiates and transition to NSAIDs and other options. He has also had concern for withdrawal and was prescribed Zubsolv on 7/3, though he reports this was too expensive and was unable to fill the rx.   Today, the patient states that over the last 2 weeks his pain has been worsening. This is chronic pain in multiple joints as well as his foot pain. It is interfering with his normal function such as walking, going to the bathroom, bathing. With his increased pain his took his previous oxycodone 5-325 rx more often than indicated in the last plan. He states he last took two Percocet two days ago and a hydrocodone yesterday (left from old prescription). Since yesterday he feels he is beginning to experience withdrawal sx of diarrhea, irritability, labile emotions, general myalgias. He expresses frustration that his pain is difficult to control and endorses passive SI. He states the last dose that was helpful in managing his sx was oxycodone 10-325.     Past Medical History:  Diagnosis Date  . Addiction, opium (HCC)   . Anxiety   . Arthritis    "right knee" (07/15/2016)  . CAP (community acquired pneumonia) 05/2014   hx/notes 06/07/2014  . Depression   . GERD (gastroesophageal reflux disease)   . Hypertension   . Migraine    "once q 3-4 years" (07/15/2016)  . Morbid obesity (HCC)   . Type II diabetes mellitus (HCC)    Review of Systems:  Review of Systems  Gastrointestinal:  Positive for diarrhea and nausea.  Musculoskeletal: Positive for joint pain and myalgias.  Psychiatric/Behavioral: The patient is nervous/anxious.      Physical Exam:  Vitals:   10/08/16 1358  BP: (!) 180/111  Pulse: 97  Temp: 98.1 F (36.7 C)  TempSrc: Oral  SpO2: 98%  Weight: (!) 496 lb 1.6 oz (225 kg)  Height: 6\' 3"  (1.905 m)   Physical Exam  Constitutional: He is oriented to person, place, and time.  Morbidly obese male, no acute distress   Abdominal:  Soft, obese abdomen   Musculoskeletal:  Tenderness to bilateral knees primarily over patella, tenderness to L achilles   Neurological: He is alert and oriented to person, place, and time.  Skin: Skin is warm and dry. Capillary refill takes less than 2 seconds.  Psychiatric:  Tearful affect at various points of encounter     Assessment & Plan:   See Encounters Tab for problem based charting.  Patient seen with Dr. Cyndie ChimeGranfortuna

## 2016-10-08 NOTE — Assessment & Plan Note (Addendum)
The pt has a long history of chronic pain and chronic opiate use, previously seen in the OUD clinic here. He has recently been seen in clinic with the intention of weaning his opiod use and continuing a non-narcotic regimen for his pain management. He reports increased pain over the last two weeks with no relief from either his opiates or NSAID regimen. This pain has decreased his ability to function more than previous and he is reporting thoughts of passive SI, saying he wonders if he'd be better of dead. He endorses early withdrawal sx today that he feels will continue to increase in severity. We will re-escalate his percocet dose to 10-325 #45 and re-established a pain contract for this seemingly sub-acute increase in his pain. We again explained this is not a good long-term solution, he must not use more than indicated or other medications for his pain, and he will have a UDS at 2 week f/u. Any request for an early refill or other breach of contract will be denied and he will be directed to the ED for severe pain or withdrawal sx. He will also continue his diclofenac. We will put in a referral for pain management for other modalities or medication options for better pain control.

## 2016-10-09 NOTE — Progress Notes (Signed)
Medicine attending: I personally interviewed and briefly examined this patient on the day of the patient visit and reviewed pertinent clinical ,laboratory, and radiographic data  with resident physician Dr. Ginger CarneKyler Harden and we discussed a management plan. We spent a long time with this patient and he mother. He has pain disproportionate to his narcotic needs. He has broken pain contract with us on numerous occasions. His mother appears to be facilitating him in his requests for pain medication. He is agreeable to referral to both a pain clinic and bariatric surgery consultation. He agrees taking stress off his joints with weight loss may solve some of his chronic pain. We have agreed to put him back on the most recent regimen that kept him comfortable but we will continue to insist he keeps his commitment to the pain contract or he will be released from our clinic.

## 2016-10-17 ENCOUNTER — Ambulatory Visit: Payer: Self-pay | Admitting: Sports Medicine

## 2016-10-22 ENCOUNTER — Ambulatory Visit (INDEPENDENT_AMBULATORY_CARE_PROVIDER_SITE_OTHER): Payer: Medicaid Other | Admitting: Internal Medicine

## 2016-10-22 VITALS — BP 127/73 | HR 86 | Temp 98.2°F | Ht 75.0 in | Wt >= 6400 oz

## 2016-10-22 DIAGNOSIS — Z6841 Body Mass Index (BMI) 40.0 and over, adult: Secondary | ICD-10-CM | POA: Diagnosis not present

## 2016-10-22 DIAGNOSIS — Z79891 Long term (current) use of opiate analgesic: Secondary | ICD-10-CM

## 2016-10-22 DIAGNOSIS — G8929 Other chronic pain: Secondary | ICD-10-CM

## 2016-10-22 DIAGNOSIS — F17210 Nicotine dependence, cigarettes, uncomplicated: Secondary | ICD-10-CM | POA: Diagnosis not present

## 2016-10-22 MED ORDER — OXYCODONE-ACETAMINOPHEN 10-325 MG PO TABS: 1.0000 | ORAL_TABLET | Freq: Three times a day (TID) | ORAL | 0 refills | Status: DC | PRN

## 2016-10-22 NOTE — Assessment & Plan Note (Signed)
History of present illness Patient reports having chronic right foot pain. States Percocet helps alleviate his pain and helps him function better. Reports taking 1 tablet every 8 hours consistently. States his last dose of Percocet was on 10/21/2016 at 6:30 PM. States he does not have any more pills of Percocet left.  Assessment Prescription opioid use for chronic right foot pain secondary to fourth metatarsal fracture not amenable to surgical repair per prior documentation. Percocet prescription was last written on 10/08/2016 for 45 tablets (2 week supply).  Plan -Urine drug screen -Refill Percocet 10-325 one tablet every 8 hours as needed #45 -Patient has been advised to return to the clinic on 10/30/2016 for his appointment with his PCP

## 2016-10-22 NOTE — Progress Notes (Signed)
   CC: Patient is here to discuss his chronic pain and opiate use.  HPI:  Brian Day is a 36 y.o. male with a past medical history of conditions listed below presenting to the clinic to discuss his chronic pain and opiate use. Please see problem based charting for the status of the patient's current and chronic medical conditions.   Past Medical History:  Diagnosis Date  . Addiction, opium (HCC)   . Anxiety   . Arthritis    "right knee" (07/15/2016)  . CAP (community acquired pneumonia) 05/2014   hx/notes 06/07/2014  . Depression   . GERD (gastroesophageal reflux disease)   . Hypertension   . Migraine    "once q 3-4 years" (07/15/2016)  . Morbid obesity (HCC)   . Type II diabetes mellitus (HCC)    Review of Systems: Pertinent positives mentioned in HPI. Remainder of all ROS negative.   Physical Exam:  Vitals:   10/22/16 1051  BP: 127/73  Pulse: 86  Temp: 98.2 F (36.8 C)  TempSrc: Oral  SpO2: 96%  Weight: (!) 508 lb 6.4 oz (230.6 kg)  Height: 6\' 3"  (1.905 m)   Physical Exam  Constitutional: He is oriented to person, place, and time. No distress.  Morbidly obese  HENT:  Head: Normocephalic and atraumatic.  Eyes: Right eye exhibits no discharge. Left eye exhibits no discharge.  Cardiovascular: Normal rate, regular rhythm and intact distal pulses.   Pulmonary/Chest: Effort normal and breath sounds normal. No respiratory distress. He has no wheezes. He has no rales.  Abdominal: Soft. Bowel sounds are normal. He exhibits no distension. There is no tenderness.  Musculoskeletal:  Normal range of motion of left foot. Decreased range of motion of right foot.  Neurological: He is alert and oriented to person, place, and time.  Skin: Skin is warm and dry.  Psychiatric: He has a normal mood and affect.    Assessment & Plan:   See Encounters Tab for problem based charting.  Patient discussed with Dr. Rogelia BogaButcher

## 2016-10-22 NOTE — Patient Instructions (Addendum)
[name] it was nice seeing you today.  I have refilled your prescription for Percocet at this visit and it should last you at least 2 weeks.  You have an appointment with your primary care physician Dr. Mikey BussingHoffman on 10/30/2016 at 10:45 AM.

## 2016-10-23 NOTE — Progress Notes (Signed)
Internal Medicine Clinic Attending  Case discussed with Dr. Loney Lohathore at the time of the visit.  We reviewed the resident's history and exam and pertinent patient test results.  I agree with the assessment, diagnosis, and plan of care documented in the resident's note. He allegedly has had violations of our chronic controlled substance policy in the past although I have not done a chart review. He was [facility]. As the patient was leaving today, I heard the nurses ask him for a urine sample in the hallway and he indicating that he had already provided a sample. After he left, Dr. Loney Lohathore informed me that he had not provided a urine sample. This is a another controlled substance violation and therefore [facility] should not provide any controlled substances for chronic pain in the future. We may consider a prescription for acute pain if he has objective evidence for pain.

## 2016-10-23 NOTE — Progress Notes (Incomplete)
Internal Medicine Clinic Attending  Case discussed with Dr. Loney Lohathore at the time of the visit.  We reviewed the resident's history and exam and pertinent patient test results.  I agree with the assessment, diagnosis, and plan of care documented in the resident's note. The pt allegedly has had controlled substance contract violations in past (ii have not completed chart review). Deemed not appropriate for suboxone. Was given another chance with controlled substances last [facility] appt. I heard nurses in hallway ask for urine as pt was leaving and pt stating it had already been collected. Dr Loney Lohathore informed me after he left clinic that he had not provided urine sample. I think this is another red flag and [facility] should not provide controlled substances for Brian Day for any chronic pain.

## 2016-10-28 NOTE — Progress Notes (Signed)
[facility] Subjective:  HPI: Brian.Brian Day is a 36 y.o. male who presents for follow up of diabetes and chronic right foot pain, he is here today with his mother.  Please see Assessment and Plan below for the status of his chronic medical problems.  Review of Systems: No chest pain, fever, chills, no polyuria or polydispia + right foot pain, + depression, negative SI. Objective:  Physical Exam: Vitals:   10/30/16 1106  BP: 122/71  Pulse: 84  Temp: 98.4 F (36.9 C)  SpO2: 95%  Weight: (!) 519 lb 8 oz (235.6 kg)  Height: 6\' 3"  (1.905 m)  Physical Exam  Constitutional: He is well-developed, well-nourished, and in no distress.  Morbidly obese  HENT:  Head: Normocephalic and atraumatic.  Cardiovascular: Normal rate and regular rhythm.   Pulmonary/Chest: Effort normal and breath sounds normal. No respiratory distress.  Skin: Skin is warm and dry.  Psychiatric: Mood and affect normal.  Nursing note and vitals reviewed.   Assessment & Plan:  Uncontrolled type 2 diabetes mellitus with hyperglycemia, without long-term current use of insulin (HCC) HPI: He has been able to receive metformin, humalog pen, and basaglar pen, as well as glipizide through [facility].  He reports he usually takes Basaglar 84 units daily, but struggles with the mealtime insulin (mainly forgetting to take), he estimates he uses humlag 12 units with only one meal.  He is not checking his sugars regularrly, when he does they are usually in the 200 range.    A: Uncontrolled DM with diabetic neuropathy  P: He has been gaining signifcant amount of weight, I discussed this is hampering efforts at glucose control and is porbably related to insulin use.  Still we need to get control of his sugars with insulin and need weight loss.  He reports he has lost some of his initial enthusiasm for DM controlled.  I again went over the complications of DM related to poor glycmic controll and he  reports he will work harder to keep up with his insulin regimen.  Med regimen: Metformin 1g BID Insulin glargine 84 units daily Insulin lispro 12 units TID with meals Glipizide 2.5 mg before breakfast (this likely is not doing much and can discontinue when complying more with insulin therapy  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 HPI: Today he still is reporting 7/10 pain in his right foot. Since I last saw him he has been seen in our clinic multiple times and was restarted on Percocet 10-325.  Additionally he has been seen in the OUD clinic multiple times but was deemed not to be a candidate for continued buprenorphine due to lack of withdrawal symptoms.  Most recently he was seen 1 week ago for follow up after starting back on the percocet, at that time per notes he was instructed to provide a urine sample, apparently when nurses asked him for the sample he replied that it was already provided however no sample was collected.  He has had abnormal UDS in the past. Additionally before the visit today I preformed a NCCSRS check and found that he was also received a 30 day supply of Zubsolv recently.  I was able to obtain a copy of the perscription and verify that it was provided by our clinic. I was at first confused by this, I discussed with Dr Criselda Peaches, she had provided the perscription on 7/3 for the [facility] and apparently it just kicked in a few weeks  later. Brian Day, reports that he is taking the Zubsolv along with his oxycodone and these are both helping his pain. He also is reports some increased left knee pain over the last few weeks, this pain worsens throughout the day, ~10 min AM stiffness  A: OA of multiple sites, due to morbid obesity, Chronic right foot pain, history of prescription drug abuse  P: Obtained urine tox screen today. I did not refill any medications today.  I discussed with him that I will have to review his urine tox first. Overall I  still feel he is a very poor candidate for chronic treatment with opioid medications.  He now has a controlled medication agreement despite his past abuses,  I will have to keep a very close watch on his medications,  I would like all other providers in our clinic to review NCCSRS before providing any controlled pain medications as well as obtain a Urine Tox screen before providing new perscriptions.  Chronic pain in right foot Please see A/P from 11/03/16 under OA of muliple joints  Chronic prescription opiate use Please see A/P from 11/03/16 under OA of muliple joints  I discontinued all refills of Zubxolv from [facility] per Dr Donnetta HutchingMullen's request.   Medications Ordered No orders of the defined types were placed in this encounter.  Other Orders Orders Placed This Encounter  Procedures  . Microalbumin / Creatinine Urine Ratio  . ToxAssure Select,+Antidepr,UR  . Glucose, capillary  . POC Hbg A1C   Follow Up: Return in about 4 weeks (around 11/27/2016).

## 2016-10-30 ENCOUNTER — Encounter: Payer: Self-pay | Admitting: Internal Medicine

## 2016-10-30 ENCOUNTER — Ambulatory Visit (INDEPENDENT_AMBULATORY_CARE_PROVIDER_SITE_OTHER): Payer: Medicaid Other | Admitting: Internal Medicine

## 2016-10-30 VITALS — BP 122/71 | HR 84 | Temp 98.4°F | Ht 75.0 in | Wt >= 6400 oz

## 2016-10-30 DIAGNOSIS — Z79891 Long term (current) use of opiate analgesic: Secondary | ICD-10-CM

## 2016-10-30 DIAGNOSIS — M159 Polyosteoarthritis, unspecified: Secondary | ICD-10-CM

## 2016-10-30 DIAGNOSIS — M150 Primary generalized (osteo)arthritis: Secondary | ICD-10-CM | POA: Diagnosis not present

## 2016-10-30 DIAGNOSIS — Z6841 Body Mass Index (BMI) 40.0 and over, adult: Secondary | ICD-10-CM | POA: Diagnosis not present

## 2016-10-30 DIAGNOSIS — M79671 Pain in right foot: Secondary | ICD-10-CM

## 2016-10-30 DIAGNOSIS — G8929 Other chronic pain: Secondary | ICD-10-CM | POA: Diagnosis not present

## 2016-10-30 DIAGNOSIS — Z794 Long term (current) use of insulin: Secondary | ICD-10-CM | POA: Diagnosis not present

## 2016-10-30 DIAGNOSIS — E1140 Type 2 diabetes mellitus with diabetic neuropathy, unspecified: Secondary | ICD-10-CM | POA: Diagnosis not present

## 2016-10-30 DIAGNOSIS — E1165 Type 2 diabetes mellitus with hyperglycemia: Secondary | ICD-10-CM | POA: Diagnosis not present

## 2016-10-30 LAB — GLUCOSE, CAPILLARY: Glucose-Capillary: 284 mg/dL — ABNORMAL HIGH (ref 65–99)

## 2016-10-30 LAB — POCT GLYCOSYLATED HEMOGLOBIN (HGB A1C): Hemoglobin A1C: 9.2

## 2016-10-30 NOTE — Patient Instructions (Signed)
Work on taking your medications and insulin all the time.  Continue to work on weight loss.

## 2016-11-03 NOTE — Assessment & Plan Note (Signed)
HPI: Today he still is reporting 7/10 pain in his right foot. Since I last saw him he has been seen in our clinic multiple times and was restarted on Percocet 10-325.  Additionally he has been seen in the OUD clinic multiple times but was deemed not to be a candidate for continued buprenorphine due to lack of withdrawal symptoms.  Most recently he was seen 1 week ago for follow up after starting back on the percocet, at that time per notes he was instructed to provide a urine sample, apparently when nurses asked him for the sample he replied that it was already provided however no sample was collected.  He has had abnormal UDS in the past. Additionally before the visit today I preformed a NCCSRS check and found that he was also received a 30 day supply of Zubsolv recently.  I was able to obtain a copy of the perscription and verify that it was provided by our clinic. I was at first confused by this, I discussed with Dr Criselda Peachesmullen, she had provided the perscription on 7/3 for the [facility] and apparently it just kicked in a few weeks later. Mr Brian Day, reports that he is taking the Zubsolv along with his oxycodone and these are both helping his pain. He also is reports some increased left knee pain over the last few weeks, this pain worsens throughout the day, ~10 min AM stiffness  A: OA of multiple sites, due to morbid obesity, Chronic right foot pain, history of prescription drug abuse  P: Obtained urine tox screen today. I did not refill any medications today.  I discussed with him that I will have to review his urine tox first. Overall I still feel he is a very poor candidate for chronic treatment with opioid medications.  He now has a controlled medication agreement despite his past abuses,  I will have to keep a very close watch on his medications,  I would like all other providers in our clinic to review NCCSRS before providing any controlled pain medications as well as obtain a Urine Tox screen  before providing new perscriptions.

## 2016-11-03 NOTE — Assessment & Plan Note (Addendum)
Please see A/P from 11/03/16 under OA of muliple joints  I discontinued all refills of Zubxolv from [facility] per [facility] request.

## 2016-11-03 NOTE — Assessment & Plan Note (Signed)
HPI: He has been able to receive metformin, humalog pen, and basaglar pen, as well as glipizide through [facility].  He reports he usually takes Basaglar 84 units daily, but struggles with the mealtime insulin (mainly forgetting to take), he estimates he uses humlag 12 units with only one meal.  He is not checking his sugars regularrly, when he does they are usually in the 200 range.    A: Uncontrolled DM with diabetic neuropathy  P: He has been gaining signifcant amount of weight, I discussed this is hampering efforts at glucose control and is porbably related to insulin use.  Still we need to get control of his sugars with insulin and need weight loss.  He reports he has lost some of his initial enthusiasm for DM controlled.  I again went over the complications of DM related to poor glycmic controll and he reports he will work harder to keep up with his insulin regimen.  Med regimen: Metformin 1g BID Insulin glargine 84 units daily Insulin lispro 12 units TID with meals Glipizide 2.5 mg before breakfast (this likely is not doing much and can discontinue when complying more with insulin therapy

## 2016-11-03 NOTE — Assessment & Plan Note (Signed)
Please see A/P from 11/03/16 under OA of muliple joints

## 2016-11-04 ENCOUNTER — Other Ambulatory Visit: Payer: Self-pay | Admitting: *Deleted

## 2016-11-04 LAB — MICROALBUMIN / CREATININE URINE RATIO
CREATININE, UR: 139.3 mg/dL
MICROALB/CREAT RATIO: 5.7 mg/g{creat} (ref 0.0–30.0)
MICROALBUM., U, RANDOM: 7.9 ug/mL

## 2016-11-04 LAB — TOXASSURE SELECT,+ANTIDEPR,UR

## 2016-11-04 NOTE — Telephone Encounter (Signed)
I called Brian Day and had a 15 minute conversation with him.  I discussed his urine Tox screen that I obtained last Thursday.  This was inappropirate in that it was absent for oxycodone.  He was supprised by the absence and kept repeating that he would come in right now to repeat the test.  Unfortuantely I told him that would be unnecessary as he has violated our medication contract and that I would not be able to continue to provide him Opioid pain medications.  He was very frustrated by this and feels betrayed by me personally. I told him that I would continue to be his physician and continue to care for his medical problems but would not be able to prescribe opioid pain medications.  He was just recently started on the percocet within the last month and given the negative screen I do not think we need to taper the medication.  In addition he has currently been taking buprenorphine..Marland Kitchen

## 2016-11-10 ENCOUNTER — Telehealth: Payer: Self-pay | Admitting: *Deleted

## 2016-11-10 NOTE — Telephone Encounter (Signed)
Pt calls and wants to know why he is "cut off from my zubsolv, I can understand what dr Mikey Bussing said about percocet but im a drug addict and I have to have zubsolv or I"ll mess up"  Please advise

## 2016-11-10 NOTE — Telephone Encounter (Signed)
Thank you Myriam Jacobson, I did discuss with him a few days ago his drug screen that was inappropriately negative for oxycodone and that was a violation of his pain contract in a very high risk patient, in addition I discussed that we could not continue to provide opiate pain medications given his violations.  I did encourage him to continue to come in for care, although he had expressed the wish to find another provider, I appreciate you offering an appointment for him tomorrow.

## 2016-11-10 NOTE — Telephone Encounter (Signed)
Spoke w/ dr's hoffman and vincent, spoke w/ pt and suggested again that he notify past provider of zubsolv or contact pain clinic.  He is not happy, states he has spent several hours with an atty. Going over all his records with [facility] and is giving this information to the atty, they will start lawsuit proceedings, he is informed he is welcome to do what he feels is best, he hung up at that time.

## 2016-11-10 NOTE — Telephone Encounter (Signed)
On my last assessment of him on 7/10, he was low risk for opioid withdrawal and I did not think would be a good candidate for continuing Zubsolv.

## 2016-11-10 NOTE — Telephone Encounter (Signed)
Pt calls back and apologizes for being harsh, then he states the drug screen is wrong, he is advised he needs to speak w/ someone as dr Mikey Bussing, he states he will come in tomor

## 2016-11-11 ENCOUNTER — Ambulatory Visit: Payer: Self-pay

## 2016-11-12 ENCOUNTER — Encounter: Payer: Self-pay | Admitting: Internal Medicine

## 2016-11-12 ENCOUNTER — Ambulatory Visit (INDEPENDENT_AMBULATORY_CARE_PROVIDER_SITE_OTHER): Payer: Medicaid Other | Admitting: Internal Medicine

## 2016-11-12 DIAGNOSIS — F17210 Nicotine dependence, cigarettes, uncomplicated: Secondary | ICD-10-CM

## 2016-11-12 DIAGNOSIS — Z79891 Long term (current) use of opiate analgesic: Secondary | ICD-10-CM | POA: Diagnosis not present

## 2016-11-12 DIAGNOSIS — I10 Essential (primary) hypertension: Secondary | ICD-10-CM | POA: Diagnosis not present

## 2016-11-12 DIAGNOSIS — M79671 Pain in right foot: Secondary | ICD-10-CM | POA: Diagnosis not present

## 2016-11-12 DIAGNOSIS — Z6841 Body Mass Index (BMI) 40.0 and over, adult: Secondary | ICD-10-CM

## 2016-11-12 DIAGNOSIS — G8929 Other chronic pain: Secondary | ICD-10-CM | POA: Diagnosis not present

## 2016-11-12 DIAGNOSIS — Z79899 Other long term (current) drug therapy: Secondary | ICD-10-CM

## 2016-11-12 DIAGNOSIS — E1142 Type 2 diabetes mellitus with diabetic polyneuropathy: Secondary | ICD-10-CM

## 2016-11-12 DIAGNOSIS — Z9114 Patient's other noncompliance with medication regimen: Secondary | ICD-10-CM | POA: Diagnosis not present

## 2016-11-12 MED ORDER — GABAPENTIN 300 MG PO CAPS: 900.0000 mg | ORAL_CAPSULE | Freq: Four times a day (QID) | ORAL | 3 refills | Status: DC

## 2016-11-12 NOTE — Assessment & Plan Note (Signed)
The main reason for his visit was to  Restart Zubsolv. According to patient it is more helpful For him to cope with pain and opiate withdrawal symptoms.  He continued to take Percocet with much less symptom relief as compared to when he was taking it with Zubsolv. His medicine was taken away recently as he did not qualify due to insufficient withdrawal symptoms.  We told patient that we will try to make his appointment with Dr. Oswaldo Done or Dr. Criselda Peaches for their Suboxone clinic on Tuesday.

## 2016-11-12 NOTE — Progress Notes (Signed)
   CC: To discuss about his pain medication especially Zubsolv.  HPI:  Brian Day is a 36 y.o. with past medical history as listed below came to the clinic today to discuss his pain meds. Apparently patient signed a contract for Percocet, later found to have Zubsolv on [place] database which was also prescribed by our clinic a few months ago and he was recently started on that by a patient assistance program. On subsequent visit with PCP his Brian Day was discontinued and he was provided with prescription for Percocet. According to patient that he was not taking Percocet very regularly as Zubsolv was helping him cope with his drug addiction. He wants to talk with Dr. Mikey Bussing to resume his Zubsolv. He seems very agitated. [place] database was checked-he filled his prescription of Percocet on 10/22/2016 and Zubsolv on 10/14/2016.urine toxicology screen done on 10/30/2016 was positive for buprenorphine and norbuprenorphine but negative for oxycodone.  Past Medical History:  Diagnosis Date  . Addiction, opium (HCC)   . Anxiety   . Arthritis    "right knee" (07/15/2016)  . CAP (community acquired pneumonia) 05/2014   hx/notes 06/07/2014  . Depression   . GERD (gastroesophageal reflux disease)   . Hypertension   . Migraine    "once q 3-4 years" (07/15/2016)  . Morbid obesity (HCC)   . Type II diabetes mellitus (HCC)    Review of Systems:  As per HPI  Physical Exam:  Vitals:   11/12/16 0904  BP: (!) 144/107  Pulse: (!) 101  Temp: 97.7 F (36.5 C)  TempSrc: Oral  SpO2: 100%  Weight: (!) 497 lb 4.8 oz (225.6 kg)  Height: 6\' 3"  (1.905 m)   Vitals:   11/12/16 0904  BP: (!) 144/107  Pulse: (!) 101  Temp: 97.7 F (36.5 C)  TempSrc: Oral  SpO2: 100%  Weight: (!) 497 lb 4.8 oz (225.6 kg)  Height: 6\' 3"  (1.905 m)   General: Vital signs reviewed.  Patient is well-developed and morbidly obese, in no acute distress and cooperative with exam.  Cardiovascular: RRR, S1 normal, S2 normal, no  murmurs, gallops, or rubs. Pulmonary/Chest: Clear to auscultation bilaterally, no wheezes, rales, or rhonchi. Abdominal: Soft, non-tender, non-distended, BS +, Musculoskeletal: .right foot dorsal tenderness. Extremities: No lower extremity edema bilaterally,  pulses symmetric and intact bilaterally. No cyanosis or clubbing. Skin: Warm, dry and intact. No rashes or erythema. Psychiatric: Normal mood and affect. Appears little agitated.  Assessment & Plan:   See Encounters Tab for problem based charting.  Patient seen with Dr. Cleda Daub.

## 2016-11-12 NOTE — Patient Instructions (Signed)
Thank you for visiting clinic today. As we discussed we are increasing the dose of gabapentin, now you will take 900 mg 4 times a day. We will also sent a message to Dr. Oswaldo Done and Dr. Criselda Peaches to see if you can qualify for Zubsolv. You will get a call with appointment. Please follow-up according to your scheduled appointment with PCP.

## 2016-11-12 NOTE — Assessment & Plan Note (Signed)
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 He has his upcoming appointment with podiatrist on all dressed 27 2018.  He was advised to keep with his appointment for a proper management of his chronic right foot pain.

## 2016-11-12 NOTE — Assessment & Plan Note (Signed)
BP Readings from Last 3 Encounters:  11/12/16 (!) 144/107  10/30/16 122/71  10/22/16 127/73   His blood pressure was elevated today. He never took his morning  Medicine before coming to the clinic stating that he normally takes it around 10 to 10:30 AM.  Continue current management. Can be reassess during next follow-up visit.

## 2016-11-12 NOTE — Assessment & Plan Note (Signed)
His neuropathic pain is better with gabapentin  900 mg 3 times a day, but not completely relieved.  -we advised patient to increase his gabapentin dose to 900 mg 4 times a day which will maximize the dose of 3600 mg daily. -Follow up with PCP.

## 2016-11-14 NOTE — Progress Notes (Signed)
Internal Medicine Clinic Attending  I saw and evaluated the patient.  I personally confirmed the key portions of the history and exam documented by Dr. Nelson Chimes and I reviewed pertinent patient test results.  The assessment, diagnosis, and plan were formulated together and I agree with the documentation in the resident's note. He has been taking Zubsolv which he reports helps with his foot pain, however this has been discontinued as he was not deemed to be a candidate for further therapy due to inconsistent adherence and lack of withdrawal symptoms.  I did discuss his concerned as laid out by Dr Nelson Chimes with Dr Oswaldo Done and Criselda Peaches who run our OUD clinic, he is still felt to be a poor candidate for buprenoprhine from our clinic due to its use is less for OUD than for chronic pain.  We discussed that he is a poor candidate from chronic narcotic medications from our clinic for chronic pain as he has been very inconsistent with his taking the medications and we have not seen great improvement from the pain.  (Only 2 of his last 6 UDS were positive for oxycodone, those were the first two and despite multiple Rx for oxycodone he was negative, and apparently did not leave a urine sample one day despite MD request.) I did call him back and let him know that he was not a candidate for buprenorphine from our clinic.  I encouraged him to keep his follow up next week with Dr Lajoyce Corners, who may also be able to provide some guidance for addressing his chronic pain.

## 2016-11-19 ENCOUNTER — Ambulatory Visit: Payer: Self-pay | Admitting: Sports Medicine

## 2016-11-21 ENCOUNTER — Ambulatory Visit (INDEPENDENT_AMBULATORY_CARE_PROVIDER_SITE_OTHER): Payer: Self-pay | Admitting: Orthopedic Surgery

## 2016-12-06 ENCOUNTER — Emergency Department (HOSPITAL_COMMUNITY): Payer: Medicaid Other

## 2016-12-06 ENCOUNTER — Emergency Department (HOSPITAL_COMMUNITY)
Admission: EM | Admit: 2016-12-06 | Discharge: 2016-12-06 | Disposition: A | Discharge status: Home / Self Care | Payer: Medicaid Other | Attending: Emergency Medicine | Admitting: Emergency Medicine

## 2016-12-06 ENCOUNTER — Encounter (HOSPITAL_COMMUNITY): Payer: Self-pay | Admitting: Emergency Medicine

## 2016-12-06 DIAGNOSIS — M79604 Pain in right leg: Secondary | ICD-10-CM | POA: Insufficient documentation

## 2016-12-06 DIAGNOSIS — Z79899 Other long term (current) drug therapy: Secondary | ICD-10-CM | POA: Insufficient documentation

## 2016-12-06 DIAGNOSIS — F17210 Nicotine dependence, cigarettes, uncomplicated: Secondary | ICD-10-CM | POA: Insufficient documentation

## 2016-12-06 DIAGNOSIS — E119 Type 2 diabetes mellitus without complications: Secondary | ICD-10-CM | POA: Insufficient documentation

## 2016-12-06 DIAGNOSIS — F419 Anxiety disorder, unspecified: Secondary | ICD-10-CM | POA: Insufficient documentation

## 2016-12-06 DIAGNOSIS — F1120 Opioid dependence, uncomplicated: Secondary | ICD-10-CM | POA: Diagnosis not present

## 2016-12-06 DIAGNOSIS — Z794 Long term (current) use of insulin: Secondary | ICD-10-CM | POA: Insufficient documentation

## 2016-12-06 DIAGNOSIS — I10 Essential (primary) hypertension: Secondary | ICD-10-CM | POA: Insufficient documentation

## 2016-12-06 DIAGNOSIS — F329 Major depressive disorder, single episode, unspecified: Secondary | ICD-10-CM | POA: Insufficient documentation

## 2016-12-06 DIAGNOSIS — M79605 Pain in left leg: Secondary | ICD-10-CM | POA: Diagnosis not present

## 2016-12-06 DIAGNOSIS — M2550 Pain in unspecified joint: Secondary | ICD-10-CM

## 2016-12-06 LAB — COMPREHENSIVE METABOLIC PANEL
ALBUMIN: 3.7 g/dL (ref 3.5–5.0)
ALT: 30 U/L (ref 17–63)
AST: 23 U/L (ref 15–41)
Alkaline Phosphatase: 69 U/L (ref 38–126)
Anion gap: 14 (ref 5–15)
BILIRUBIN TOTAL: 0.7 mg/dL (ref 0.3–1.2)
BUN: 10 mg/dL (ref 6–20)
CHLORIDE: 96 mmol/L — AB (ref 101–111)
CO2: 22 mmol/L (ref 22–32)
Calcium: 8.9 mg/dL (ref 8.9–10.3)
Creatinine, Ser: 0.88 mg/dL (ref 0.61–1.24)
GFR calc Af Amer: >60 mL/min (ref >60)
GFR calc non Af Amer: >60 mL/min (ref >60)
GLUCOSE: 337 mg/dL — AB (ref 65–99)
POTASSIUM: 3.8 mmol/L (ref 3.5–5.1)
Sodium: 132 mmol/L — ABNORMAL LOW (ref 135–145)
Total Protein: 7.3 g/dL (ref 6.5–8.1)

## 2016-12-06 LAB — URINALYSIS, ROUTINE W REFLEX MICROSCOPIC
BACTERIA UA: NONE SEEN
BILIRUBIN URINE: NEGATIVE
Glucose, UA: >=500 mg/dL — AB
Hgb urine dipstick: NEGATIVE
KETONES UR: 5 mg/dL — AB
LEUKOCYTES UA: NEGATIVE
Nitrite: NEGATIVE
PH: 5 (ref 5.0–8.0)
PROTEIN: NEGATIVE mg/dL
Specific Gravity, Urine: 1.037 — ABNORMAL HIGH (ref 1.005–1.030)

## 2016-12-06 LAB — CBC WITH DIFFERENTIAL/PLATELET
Basophils Absolute: 0 10*3/uL (ref 0.0–0.1)
Basophils Relative: 0 %
Eosinophils Absolute: 0 10*3/uL (ref 0.0–0.7)
Eosinophils Relative: 0 %
HCT: 45.7 % (ref 39.0–52.0)
Hemoglobin: 16.4 g/dL (ref 13.0–17.0)
LYMPHS ABS: 2.2 10*3/uL (ref 0.7–4.0)
Lymphocytes Relative: 17 %
MCH: 31.7 pg (ref 26.0–34.0)
MCHC: 35.9 g/dL (ref 30.0–36.0)
MCV: 88.4 fL (ref 78.0–100.0)
MONO ABS: 0.8 10*3/uL (ref 0.1–1.0)
MONOS PCT: 6 %
Neutro Abs: 9.6 10*3/uL — ABNORMAL HIGH (ref 1.7–7.7)
Neutrophils Relative %: 77 %
Platelets: 166 10*3/uL (ref 150–400)
RBC: 5.17 MIL/uL (ref 4.22–5.81)
RDW: 12.3 % (ref 11.5–15.5)
WBC: 12.7 10*3/uL — ABNORMAL HIGH (ref 4.0–10.5)

## 2016-12-06 LAB — CK: CK TOTAL: 70 U/L (ref 49–397)

## 2016-12-06 LAB — I-STAT CG4 LACTIC ACID, ED
LACTIC ACID, VENOUS: 2.69 mmol/L — AB (ref 0.5–1.9)
LACTIC ACID, VENOUS: 1.8 mmol/L (ref 0.5–1.9)

## 2016-12-06 MED ORDER — MORPHINE SULFATE (PF) 4 MG/ML IV SOLN
4.0000 mg | Freq: Once | INTRAVENOUS | Status: AC
  Administered 2016-12-06: 4 mg via INTRAVENOUS
  Filled 2016-12-06: qty 1

## 2016-12-06 MED ORDER — OXYCODONE-ACETAMINOPHEN 5-325 MG PO TABS
ORAL_TABLET | ORAL | Status: AC
  Filled 2016-12-06: qty 1

## 2016-12-06 MED ORDER — ACETAMINOPHEN 500 MG PO TABS
1000.0000 mg | ORAL_TABLET | Freq: Once | ORAL | Status: AC
  Administered 2016-12-06: 1000 mg via ORAL
  Filled 2016-12-06: qty 2

## 2016-12-06 MED ORDER — SODIUM CHLORIDE 0.9 % IV BOLUS (SEPSIS)
1000.0000 mL | Freq: Once | INTRAVENOUS | Status: AC
  Administered 2016-12-06: 1000 mL via INTRAVENOUS

## 2016-12-06 MED ORDER — OXYCODONE-ACETAMINOPHEN 5-325 MG PO TABS
1.0000 | ORAL_TABLET | ORAL | Status: DC | PRN
  Administered 2016-12-06: 1 via ORAL

## 2016-12-06 NOTE — ED Notes (Signed)
Patient left at this time with all belongings. 

## 2016-12-06 NOTE — Discharge Instructions (Signed)
As discussed, your evaluation today has been largely reassuring.  But, it is important that you monitor your condition carefully, and do not hesitate to return to the ED if you develop new, or concerning changes in your condition. ? ?Otherwise, please follow-up with your physician for appropriate ongoing care. ? ?

## 2016-12-06 NOTE — ED Notes (Signed)
Informed first nurse Scarlett of lactic acid 2.69

## 2016-12-06 NOTE — ED Provider Notes (Signed)
Brian Day Provider Note   CSN: [id number] Arrival date & time: 12/06/16  1321   History   Chief Complaint Chief Complaint  Patient presents with  . Foot Pain  . Headache    HPI Brian Day is a 36 y.o. male.  HPI  Patient presents with concern of pain in his lower extremities. Patient acknowledges history of prior episode of right foot infection requiring hospitalization several months ago, but states that since that hospital relation has been doing generally well. 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 No recent fall, trauma, accident. However, over the past day or so the patient has developed pain in both lower extremities from hip inferiorly. No loss of sensation or weakness, but there is substantial pain with any motion. Patient also acknowledges recently stopping his narcotic prescription. Patient denies fever, chills, did have one episode of vomiting in the waiting room. No urinary complaints. Patient is here with his mother who assists with the history of present illness. Specifically the patient states the foot appears substantially better than his last hospitalization.   Past Medical History:  Diagnosis Date  . Addiction, opium ([place])   . Anxiety   . Arthritis    "right knee" (07/15/2016)  . CAP (community acquired pneumonia) 05/2014   hx/notes 06/07/2014  . Depression   . GERD (gastroesophageal reflux disease)   . Hypertension   . Migraine    "once q 3-4 years" (07/15/2016)  . Morbid obesity ([place])   . Type II diabetes mellitus [facility])     Patient Active Problem List   Diagnosis Date Noted  . 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 Other long term (current) drug therapy 09/23/2016  . Chronic prescription opiate use 09/02/2016  . 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 Idiopathic chronic gout of right ankle without tophus   . Chronic pain in right foot  07/15/2016  . Diabetic peripheral neuropathy ([place]) 12/29/2013  . Benign essential hypertension 10/27/2013  . Uncontrolled type 2 diabetes mellitus with hyperglycemia, without long-term current use of insulin ([place]) 11/23/2012  . Morbid obesity ([place]) 11/23/2012  . Low back pain 11/23/2012    Past Surgical History:  Procedure Laterality Date  . KNEE ARTHROSCOPY Right 2000s       Home Medications    Prior to Admission medications   Medication Sig Start Date End Date Taking? Authorizing Provider  gabapentin (NEURONTIN) 300 MG capsule Take 3 capsules (900 mg total) by mouth 4 (four) times daily. 11/12/16 11/12/17 Yes Lorella Nimrod, MD  glipiZIDE (GLUCOTROL) 5 MG tablet Take 0.5 tablets (2.5 mg total) by mouth daily before breakfast. Patient taking differently: Take 5 mg by mouth daily before breakfast.  10/08/16  Yes Lucious Groves, DO  hydrochlorothiazide (HYDRODIURIL) 25 MG tablet Take 1 tablet (25 mg total) by mouth daily. 10/08/16  Yes Lucious Groves, DO  Insulin Glargine (BASAGLAR KWIKPEN) 100 UNIT/ML SOPN Inject 0.84 mLs (84 Units total) into the skin at bedtime. Patient taking differently: Inject 85 Units into the skin at bedtime.  10/08/16  Yes Lucious Groves, DO  insulin lispro (HUMALOG KWIKPEN) 100 UNIT/ML KiwkPen Inject 0.12 mLs (12 Units total) into the skin 3 (three) times daily. With meals 10/08/16  Yes Lucious Groves, DO  metFORMIN (GLUCOPHAGE) 1000 MG tablet Take 1 tablet (1,000 mg total) by mouth 2 (two) times daily with a meal. 10/08/16  Yes Lucious Groves, DO  omeprazole (PRILOSEC)  40 MG capsule Take 1 capsule (40 mg total) by mouth daily. 10/08/16  Yes Lucious Groves, DO  sertraline (ZOLOFT) 100 MG tablet Take 0.5 tablets (50 mg total) by mouth daily. Patient taking differently: Take 200 mg by mouth daily.  10/08/16  Yes Lucious Groves, DO  allopurinol (ZYLOPRIM) 100 MG tablet Take 1 tablet (100 mg total) by mouth 2 (two) times daily. Patient not taking: Reported on  12/06/2016 10/08/16   Lucious Groves, DO  blood glucose meter kit and supplies Dispense based on patient and insurance preference. Use up to four times daily as directed. (FOR ICD-9 250.00, 250.01). Patient not taking: Reported on 12/06/2016 07/22/16   Holley Raring, MD  diclofenac (VOLTAREN) 75 MG EC tablet Take 1 tablet (75 mg total) by mouth 2 (two) times daily. Patient not taking: Reported on 12/06/2016 09/25/16   Lucious Groves, DO  Insulin Syringe-Needle U-100 31G X 15/64" 1 ML MISC The patient is insulin requiring, ICD 10 code E10.9. The patient tests 4 times per day. Patient not taking: Reported on 12/06/2016 07/22/16   Holley Raring, MD  oxyCODONE-acetaminophen (PERCOCET) 10-325 MG tablet Take 1 tablet by mouth every 8 (eight) hours as needed for pain. Patient not taking: Reported on 12/06/2016 10/22/16   Shela Leff, MD    Family History Family History  Problem Relation Age of Onset  . Diabetes Mother   . Hypertension Mother   . Hypertension Father     Social History Social History  Substance Use Topics  . Smoking status: Current Every Day Smoker    Packs/day: 0.50    Years: 12.00    Types: Cigarettes  . Smokeless tobacco: Former Systems developer    Types: Snuff, Chew     Comment: 1/2 [place]  . Alcohol use No     Allergies   Colchicine and Lisinopril   Review of Systems Review of Systems  Constitutional:       Per HPI, otherwise negative  HENT:       Per HPI, otherwise negative  Respiratory:       Per HPI, otherwise negative  Cardiovascular:       Per HPI, otherwise negative  Gastrointestinal: Negative for vomiting.  Endocrine:       Negative aside from HPI  Genitourinary:       Neg aside from HPI   Musculoskeletal:       Per HPI, otherwise negative  Skin: Negative.   Neurological: Negative for syncope.     Physical Exam Updated Vital Signs BP (!) 178/111 (BP Location: Right Arm)   Pulse (!) 108   Temp 98.1 F (36.7 C) (Oral)   Resp 18   Ht  _0  (1.88 m)   Wt (!) 213.2 kg (470 lb)   SpO2 95%   BMI 60.34 kg/m   Physical Exam  Constitutional: He is oriented to person, place, and time. No distress.  Morbidly obese young M, awake and alert  HENT:  Head: Normocephalic and atraumatic.  Eyes: Conjunctivae and EOM are normal.  Cardiovascular: Normal rate and regular rhythm.   Pulmonary/Chest: Effort normal. No stridor. No respiratory distress.  Abdominal: He exhibits no distension.  Musculoskeletal: He exhibits no edema.  No gross deformities, and both lower extremities have similar color, appropriate Refill, no drainage, bleeding. Patient can move all extremity spontaneously, but states that it hurts too much to move his legs and dependently.   Neurological: He is alert and oriented to person, place, and  time.  Skin: Skin is warm and dry.  Psychiatric: He has a normal mood and affect.  Nursing note and vitals reviewed.    ED Treatments / Results  Labs (all labs ordered are listed, but only abnormal results are displayed) Labs Reviewed  COMPREHENSIVE METABOLIC PANEL - Abnormal; Notable for the following:       Result Value   Sodium 132 (*)    Chloride 96 (*)    Glucose, Bld 337 (*)    All other components within normal limits  CBC WITH DIFFERENTIAL/PLATELET - Abnormal; Notable for the following:    WBC 12.7 (*)    Neutro Abs 9.6 (*)    All other components within normal limits  I-STAT CG4 LACTIC ACID, ED - Abnormal; Notable for the following:    Lactic Acid, Venous 2.69 (*)    All other components within normal limits  URINALYSIS, ROUTINE W REFLEX MICROSCOPIC  CK   Radiology Dg Chest 2 View  Result Date: 12/06/2016 CLINICAL DATA:  Patient is weak, cough, states he woke up and is feeling unwell. Pain in right foot with previous break in 2000. Hx of CAP, hypertension, and diabetes. EXAM: CHEST  2 VIEW COMPARISON:  08/23/2014 FINDINGS: Lateral view degraded by patient arm position. Moderate thoracic spondylosis.  Midline trachea. Normal heart size and mediastinal contours. No pleural effusion or pneumothorax. Diffuse peribronchial thickening. No lobar consolidation. IMPRESSION: No acute cardiopulmonary disease. Peribronchial thickening which may relate to chronic bronchitis or smoking. Electronically Signed   By: Abigail Miyamoto M.D.   On: 12/06/2016 14:33   Dg Foot Complete Right  Result Date: 12/06/2016 CLINICAL DATA:  Patient is weak, cough, states he woke up and is feeling unwell. Pain in right foot with previous break in 2000. Hx of CAP, hypertension, and diabetes. EXAM: RIGHT FOOT COMPLETE - 3+ VIEW COMPARISON:  07/11/2016 and MRI of 07/16/2016. FINDINGS: Nonunited proximal fourth metatarsal shaft fracture with extensive callus deposition. Similar. No new fracture identified. No soft tissue gas or radiopaque foreign object. Achilles and calcaneal spurs. Mild soft tissue swelling is diffuse. IMPRESSION: No interval change since 07/11/2016. Nonunited fourth metatarsal shaft fracture with abundant callus deposition. Electronically Signed   By: Abigail Miyamoto M.D.   On: 12/06/2016 14:41    Procedures Procedures (including critical care time)  Medications Ordered in ED Medications  oxyCODONE-acetaminophen (PERCOCET/ROXICET) 5-325 MG per tablet 1 tablet (1 tablet Oral Given 12/06/16 1359)  sodium chloride 0.9 % bolus 1,000 mL (not administered)     Initial Impression / Assessment and Plan / ED Course  I have reviewed the triage vital signs and the nursing notes.  Pertinent labs & imaging results that were available during my care of the patient were reviewed by me and considered in my medical decision making (see chart for details).  7:20 PM Patient appears better, sitting upright. We discussed all findings quitting some evidence for dehydration, but otherwise reassuring findings, and with resolved lactic acid level, no evidence for recurrent infection in his foot, no evidence for bacteremia, sepsis,  rhabdomyolysis, patient is appropriate for discharge with primary care follow-up.  Final Clinical Impressions(s) / ED Diagnoses   Final diagnoses:  Polyarthralgia     Carmin Muskrat, MD 12/06/16 [place]

## 2016-12-06 NOTE — ED Notes (Signed)
Pt aware that urine sample is needed, but is unable to provide one at this time 

## 2016-12-06 NOTE — ED Notes (Signed)
Pt continues to complain of 10/10 pain, pt states nothing is working and his legs are killing him.   Pt given a sandwich and water.

## 2016-12-06 NOTE — ED Notes (Signed)
Pt reminded that a urine sample is needed 

## 2016-12-06 NOTE — ED Triage Notes (Addendum)
Pt states he almost had to have his right foot amputated in May, was admitted to hospital and received IV antibiotics. Pt states pain has remained since then but has gotten worse. Pain to right foot, body aches, and bilateral hip pain. Right foot with no discoloration, no drainage, looks slightly swollen in comparison to left foot. was taking percocet for pain but has been off for 1 month. Afebrile at triage.

## 2016-12-12 ENCOUNTER — Telehealth: Payer: Self-pay | Admitting: Internal Medicine

## 2016-12-12 NOTE — Telephone Encounter (Signed)
CALLED PATIENT, LMTCB AND MAKE APPT TO DO NEW APP FOR [facility]

## 2016-12-19 NOTE — Addendum Note (Signed)
Addended by: Neomia Dear on: 12/19/2016 03:29 PM   Modules accepted: Orders

## 2017-01-07 NOTE — Addendum Note (Signed)
Addended by: Neomia DearPOWERS, Breya Cass E on: 01/07/2017 05:33 PM   Modules accepted: Orders

## 2017-01-14 NOTE — Addendum Note (Signed)
Addended by: Remus BlakeBARROW, Kenlea Woodell K on: 01/14/2017 10:16 AM   Modules accepted: Orders

## 2017-01-23 ENCOUNTER — Ambulatory Visit (INDEPENDENT_AMBULATORY_CARE_PROVIDER_SITE_OTHER): Payer: Medicaid Other | Admitting: Internal Medicine

## 2017-01-23 VITALS — BP 144/88 | HR 100 | Temp 97.8°F | Ht 75.0 in | Wt >= 6400 oz

## 2017-01-23 DIAGNOSIS — Z6841 Body Mass Index (BMI) 40.0 and over, adult: Secondary | ICD-10-CM

## 2017-01-23 DIAGNOSIS — G8929 Other chronic pain: Secondary | ICD-10-CM | POA: Diagnosis not present

## 2017-01-23 DIAGNOSIS — F17210 Nicotine dependence, cigarettes, uncomplicated: Secondary | ICD-10-CM | POA: Diagnosis not present

## 2017-01-23 DIAGNOSIS — M79671 Pain in right foot: Secondary | ICD-10-CM

## 2017-01-23 DIAGNOSIS — J069 Acute upper respiratory infection, unspecified: Secondary | ICD-10-CM

## 2017-01-23 MED ORDER — SALINE SPRAY 0.65 % NA SOLN: 1.0000 | NASAL | 0 refills | Status: DC | PRN

## 2017-01-23 MED ORDER — GUAIFENESIN-CODEINE 100-10 MG/5ML PO SYRP: 5.0000 mL | ORAL_SOLUTION | Freq: Three times a day (TID) | ORAL | 0 refills | Status: DC | PRN

## 2017-01-23 MED ORDER — FLUTICASONE PROPIONATE 50 MCG/ACT NA SUSP: 1.0000 | Freq: Every day | NASAL | 2 refills | Status: DC

## 2017-01-23 NOTE — Patient Instructions (Signed)
You have a viral infection of your sinuses and upper respiratory tract. Unfortunately, we do not have any good antibiotics to treat this infection. However, it should resolve on its own in 10-14 days.   BUFFERED ISOTONIC SALINE NASAL IRRIGATION  The Benefits:  1. When you irrigate, the isotonic saline (salt water) acts as a solvent and washes the mucus crusts and other debris from your nose.  2. This decongests and improves the airflow into your nose. The sinus passages begin to open.  3. Studies have also shown that a salt water and an alkaline (baking soda) irrigation solution improves nasal membrane cell function (mucociliary flow of mucus debris).  The Recipe:  1. Choose a 1-quart glass jar that is thoroughly cleansed.  2. Fill with sterile or distilled water, or you can boil water from the tap.  3. Add 1 to 2 heaping teaspoons of "pickling/canning/sea" salt (NOT table salt as it contains a large number of additives). This salt is available at the grocery store in the food canning section.  4. Add 1 teaspoon of [employer] Hammer Baking Soda (pure bicarbonate).  5. Mix ingredients together and store at room temperature. Discard after one week. If you find this solution too strong, you may decrease the amount of salt added to 1 to 1  teaspoons. With children it is often best to start with a milder solution and advance slowly. Irrigate with 240 ml (8 oz) twice daily.  The Instructions:  You should plan to irrigate your nose with buffered isotonic saline 2 times per day. Many people prefer to warm the solution slightly in the microwave - but be sure that the solution is NOT HOT. Stand over the sink (some do this in the shower) and squirt the solution into each side of your nose, keeping your mouth open. This allows you to spit the saltwater out of your mouth. It will not harm you if you swallow a little.  If you have been told to use a nasal steroid such as Flonase, Nasonex, or Nasacort, you  should always use isortonic saline solution first, then use your nasal steroid product. The nasal steroid is much more effective when sprayed onto clean nasal membranes and the steroid medicine will reach deeper into the nose.  Most people experience a little burning sensation the first few times they use a isotonic saline solution, but this usually goes away within a few days.    If your symptoms worsen or you get more short of breath to the extent you cannot walk easily please call us back to be seen again SOON.

## 2017-01-23 NOTE — Progress Notes (Signed)
CC: Cough and chills  HPI:  [name] is a 36 y.o. male with PMHx detailed below presenting with 5 day history of productive cough and chills.  See problem based assessment and plan below for additional details.  Upper respiratory infection HPI: He has 5 days of cough producing yellow sputum and chills. His symptoms started to improve for one day but worsened so he came to clinic for evaluation. He took some left over oral antibiotics "starting with a C" but noticed no improvement. He complains of coughing with associated chest pain, sweating, and feels short of breath but is always short of breath. A: Viral URI. His dyspnea and wheezing is likely due more to underlying body habitus now with an acute insult. P: Instructed on daily sinus irrigation with saline Start daily flonase Recommended use of tylenol or NSAIDs or both PRN for pain or fevers at home Prescription written for short course of cheratussin given frequent painful coughing Instructions to call back or return if dyspnea progresses or high fevers lasting into next week  Chronic pain in right foot He was previously seen at [facility] for chronic right leg pains and stopped seeing them after losing insurance coverage. He requests referral to them now that his medicaid is reinstated. Referral sent.  Morbid obesity (HCC) He requests information about bariatric surgery. He is actually doing an impressive job having lost 50 lbs in the past year. Provided contact info and class info for [facility] today.    Past Medical History:  Diagnosis Date  . Addiction, opium (HCC)   . Anxiety   . Arthritis    "right knee" (07/15/2016)  . CAP (community acquired pneumonia) 05/2014   hx/notes 06/07/2014  . Depression   . GERD (gastroesophageal reflux disease)   . Hypertension   . Migraine    "once q 3-4 years" (07/15/2016)  . Morbid obesity (HCC)   . Type II diabetes mellitus (HCC)      Review of Systems: Review of Systems  Constitutional: Positive for chills, diaphoresis, fever and malaise/fatigue.  HENT: Positive for congestion and sore throat. Negative for ear pain and sinus pain.   Eyes: Negative for blurred vision and pain.  Respiratory: Positive for cough, sputum production, shortness of breath and wheezing. Negative for hemoptysis.   Cardiovascular: Positive for chest pain and leg swelling. Negative for palpitations.  Gastrointestinal: Negative for abdominal pain, diarrhea and nausea.  Genitourinary: Negative for dysuria.  Musculoskeletal: Positive for back pain and joint pain. Negative for falls and myalgias.  Skin: Negative for rash.  Neurological: Positive for headaches. Negative for dizziness and focal weakness.     Physical Exam: Vitals:   01/23/17 1035  BP: (!) 144/88  Pulse: 100  Temp: 97.8 F (36.6 C)  TempSrc: Oral  SpO2: 97%  Weight: (!) 467 lb 14.4 oz (212.2 kg)  Height: 6\' 3"  (1.905 m)   GENERAL- Morbidly obese man who appears uncomfortable but in no acute distress HEENT- No sinus tenderness to palpation, minimal posterior oropharynx erythema, neck mildly tender to palpation, no cervical lymphadenopathy CARDIAC- RRR, no murmurs, rubs or gallops. RESP- Distant breath sounds with fair air movement throughout, expiratory wheezes are audible throughout lung fields EXTREMITIES- Surgical scarring on R ankle, no pedal edema. SKIN- Cool, damp skin without discoloration PSYCH- Normal mood and affect, appropriate thought content and speech.   Assessment & Plan:   See encounters tab for problem based medical decision making.   Patient discussed with Dr. Sandre Kittyaines

## 2017-01-24 NOTE — Assessment & Plan Note (Signed)
He was previously seen at [facility] for chronic right leg pains and stopped seeing them after losing insurance coverage. He requests referral to them now that his medicaid is reinstated. Referral sent.

## 2017-01-24 NOTE — Assessment & Plan Note (Signed)
HPI: He has 5 days of cough producing yellow sputum and chills. His symptoms started to improve for one day but worsened so he came to clinic for evaluation. He took some left over oral antibiotics "starting with a C" but noticed no improvement. He complains of coughing with associated chest pain, sweating, and feels short of breath but is always short of breath. A: Viral URI. His dyspnea and wheezing is likely due more to underlying body habitus now with an acute insult. P: Instructed on daily sinus irrigation with saline Start daily flonase Recommended use of tylenol or NSAIDs or both PRN for pain or fevers at home Prescription written for short course of cheratussin given frequent painful coughing Instructions to call back or return if dyspnea progresses or high fevers lasting into next week

## 2017-01-24 NOTE — Assessment & Plan Note (Signed)
He requests information about bariatric surgery. He is actually doing an impressive job having lost 50 lbs in the past year. Provided contact info and class info for [facility] today.

## 2017-01-26 NOTE — Progress Notes (Signed)
Internal Medicine Clinic Attending  Case discussed with Dr. Rice  at the time of the visit.  We reviewed the resident's history and exam and pertinent patient test results.  I agree with the assessment, diagnosis, and plan of care documented in the resident's note.  Alexander N Raines, MD   

## 2017-02-10 ENCOUNTER — Telehealth: Payer: Self-pay | Admitting: Internal Medicine

## 2017-02-10 DIAGNOSIS — F331 Major depressive disorder, recurrent, moderate: Secondary | ICD-10-CM

## 2017-02-10 NOTE — Telephone Encounter (Signed)
Pt [facility] for his [facility] now that he has Medicaid.  Please Advise.

## 2017-02-10 NOTE — Telephone Encounter (Signed)
Behavioral health referral placed 

## 2017-02-16 ENCOUNTER — Other Ambulatory Visit: Payer: Self-pay

## 2017-02-16 ENCOUNTER — Encounter: Payer: Self-pay | Admitting: Internal Medicine

## 2017-02-16 ENCOUNTER — Ambulatory Visit (INDEPENDENT_AMBULATORY_CARE_PROVIDER_SITE_OTHER): Payer: Medicaid Other | Admitting: Internal Medicine

## 2017-02-16 VITALS — BP 126/89 | HR 97 | Temp 98.1°F | Ht 75.0 in | Wt >= 6400 oz

## 2017-02-16 DIAGNOSIS — E1142 Type 2 diabetes mellitus with diabetic polyneuropathy: Secondary | ICD-10-CM | POA: Diagnosis not present

## 2017-02-16 DIAGNOSIS — F331 Major depressive disorder, recurrent, moderate: Secondary | ICD-10-CM

## 2017-02-16 DIAGNOSIS — Z9114 Patient's other noncompliance with medication regimen: Secondary | ICD-10-CM

## 2017-02-16 DIAGNOSIS — G8929 Other chronic pain: Secondary | ICD-10-CM

## 2017-02-16 DIAGNOSIS — Z818 Family history of other mental and behavioral disorders: Secondary | ICD-10-CM

## 2017-02-16 DIAGNOSIS — E1165 Type 2 diabetes mellitus with hyperglycemia: Secondary | ICD-10-CM

## 2017-02-16 DIAGNOSIS — M21961 Unspecified acquired deformity of right lower leg: Secondary | ICD-10-CM

## 2017-02-16 DIAGNOSIS — F329 Major depressive disorder, single episode, unspecified: Secondary | ICD-10-CM | POA: Diagnosis not present

## 2017-02-16 DIAGNOSIS — M2061 Acquired deformities of toe(s), unspecified, right foot: Secondary | ICD-10-CM

## 2017-02-16 LAB — GLUCOSE, CAPILLARY: Glucose-Capillary: 305 mg/dL — ABNORMAL HIGH (ref 65–99)

## 2017-02-16 LAB — POCT GLYCOSYLATED HEMOGLOBIN (HGB A1C): Hemoglobin A1C: 12.6

## 2017-02-16 MED ORDER — INSULIN NPH ISOPHANE & REGULAR (70-30) 100 UNIT/ML ~~LOC~~ SUSP: SUBCUTANEOUS | 6 refills | Status: DC

## 2017-02-16 MED ORDER — "INSULIN SYRINGE-NEEDLE U-100 31G X 15/64"" 1 ML MISC": 0 refills | Status: DC

## 2017-02-16 NOTE — Progress Notes (Signed)
   CC: "high blood sugar levels"  HPI: [name] is a 36 y.o.  Now has medicaid, would like to have  Denied fever, chills, nausea, vomiting,   Diabetes: Assessment: Hasn't been using any of his diabetic medication to date.  Metformin was stopped by the patient due to diarrhea Glipizide has been continued at 2.5mg  daily before breakfast.  HgbA1c 12.6, CBG 305 Has not been compliant with medications due to insurance issues, refusal to take med four times per day as this is an inconvenience. Patient needs referral to ophthalmology at his next visit.  Plan: 70/30 two times per day to "[facility] [place]"  Initiated 65 units in am and 35 units in pm. Given weight, approximately 180 units could be utilized, but opted to start low and titrate up slowly  Discussed taking his blood glucose reading at least once per day.  Patient stated that he would maintain this regimen as well as the  Glipizide 2.5mg  daily. Will need significant dose adjustment in the future.  Placed referral to podiatry for peripheral neuropathy.   Depression: Patient is to call psychiatry. He is aware that the wait time is up to three months. He was evaluated and failed SSRI therapy at least once as documented and up to three medications as per patient. 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 Patient did not believe the SSRI's had any benefit whatsoever and as such did not think starting these was beneficial. Denied suicidal thoughts or ideation.   Chronic pain: Placed referral to pain clinic. Patient was previous scheduled but did not show. He stated he was unaware of this appointment.    Past Medical History:  Diagnosis Date  . Addiction, opium (HCC)   . Anxiety   . Arthritis    "right knee" (07/15/2016)  . CAP (community acquired pneumonia) 05/2014   hx/notes 06/07/2014  . Depression   . GERD  (gastroesophageal reflux disease)   . Hypertension   . Migraine    "once q 3-4 years" (07/15/2016)  . Morbid obesity (HCC)   . Type II diabetes mellitus (HCC)    Review of Systems:  ROS negative except as per HPI.  Physical Exam:  Vitals:   02/16/17 1522  BP: 126/89  Pulse: 97  Temp: 98.1 F (36.7 C)  TempSrc: Oral  SpO2: 97%  Weight: (!) 472 lb 4.8 oz (214.2 kg)  Height: 6\' 3"  (1.905 m)   Physical Exam  Constitutional: He is oriented to person, place, and time. He appears well-developed and well-nourished. No distress.  Cardiovascular: Normal rate and regular rhythm.  No murmur heard. Pulmonary/Chest: Effort normal and breath sounds normal. No respiratory distress. He has no wheezes.  Abdominal: Soft. Bowel sounds are normal. There is no tenderness.  Musculoskeletal: He exhibits deformity (To the medial aspect of the right foot, and third toe of the same foot). He exhibits no edema or tenderness.  Neurological: He is alert and oriented to person, place, and time.  Skin: Skin is warm.     Assessment & Plan:   See Encounters Tab for problem based charting.  Patient seen with Dr. Sandre Kittyaines

## 2017-02-16 NOTE — Patient Instructions (Signed)
FOLLOW-UP INSTRUCTIONS When: As soon as available with Dr. Mikey BussingHoffman hopefully 3-4 wks For: Blood glucose check, diabetes evaluation, medication use What to bring: Glucometer for readings printout, medications (not insulin)  I will presribe the insulin to be taken two times daily. You must take this with your two largest meals. Please check your glucose levels with the glucometer twice daily before you inject your insulin but at least once per day in the morning.   I have placed the referral to podiatry and the pain clinic. I will also attempt the diabetic shoe paperwork today.   You will need to call psychiatry.  [facility]

## 2017-02-16 NOTE — Assessment & Plan Note (Addendum)
Depression: Patient is to call psychiatry. He is aware that the wait time is up to three months. He was evaluated and failed SSRI therapy at least once as documented and up to three medications as per patient. 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 Patient did not believe the SSRI's had any benefit whatsoever and as such did not think starting these was beneficial. Denied suicidal thoughts or ideation.

## 2017-02-16 NOTE — Assessment & Plan Note (Addendum)
Diabetes: Assessment: Hasn't been using any of his diabetic medication to date.  Metformin was stopped by the patient due to diarrhea Glipizide has been continued at 2.5mg  daily before breakfast.  HgbA1c 12.6, CBG 305 Has not been compliant with medications due to insurance issues, refusal to take med four times per day as this is an inconvenience. Patient needs referral to ophthalmology at his next visit.   Plan: 70/30 two times per day to "[facility] [place]"  Initiated 65 units in am and 35 units in pm. Given weight, approximately 180 units could be utilized, but opted to start low and titrate up slowly  Discussed taking his blood glucose reading at least once per day.  Patient stated that he would maintain this regimen as well as the  Glipizide 2.5mg  daily. Will need significant dose adjustment in the future.  Placed referral to podiatry.

## 2017-02-17 ENCOUNTER — Telehealth: Payer: Self-pay | Admitting: *Deleted

## 2017-02-17 ENCOUNTER — Other Ambulatory Visit: Payer: Self-pay | Admitting: Internal Medicine

## 2017-02-17 DIAGNOSIS — E1165 Type 2 diabetes mellitus with hyperglycemia: Secondary | ICD-10-CM

## 2017-02-17 NOTE — Progress Notes (Signed)
Internal Medicine Clinic Attending  I saw and evaluated the patient.  I personally confirmed the key portions of the history and exam documented by Dr. Crista ElliotHarbrecht and I reviewed pertinent patient test results.  The assessment, diagnosis, and plan were formulated together and I agree with the documentation in the resident's note.  Difficult to control diabetes as he has trouble mainting qAC insulin. After extensive counseling with Dr. Crista ElliotHarbrecht, agreed to NPH BID with breakfast and dinner. Also discussed his mental health, will be important for him to see psychiatry for depression unresponsive to maximum dose SSRI.  Anne ShutterAlexander N Raines, MD

## 2017-02-17 NOTE — Telephone Encounter (Signed)
SPOKE WITH MOTHER AND WITH [facility],PATIENT IS AWARE THAT HE HAS PAIN CLINIC APPOINTMENT FOR DEC. 13. 2018 @ 10:00AM

## 2017-02-17 NOTE — Telephone Encounter (Signed)
Pt calls and states he needs a PA on his insulin, please address asap Also pt states he needs something done about pain, he states nurse told him to call back if he doesn't have pain med, he is calling back and has not heard from the pain clinic

## 2017-02-17 NOTE — Telephone Encounter (Signed)
Have they sent the paperwork for this?

## 2017-02-18 MED ORDER — INSULIN ISOPHANE & REGULAR (HUMAN 70-30)100 UNIT/ML KWIKPEN: PEN_INJECTOR | SUBCUTANEOUS | 11 refills | Status: DC

## 2017-02-18 NOTE — Telephone Encounter (Signed)
Patient's insurance does not cover Novolin 70/30.  Will cover Humulin 70/ 30. Consider a change.

## 2017-02-18 NOTE — Telephone Encounter (Signed)
Refilled script with humulin quick pen 70/30. Please let me know. Thanks

## 2017-02-20 ENCOUNTER — Telehealth: Payer: Self-pay | Admitting: *Deleted

## 2017-02-20 NOTE — Telephone Encounter (Signed)
Message to change insulin on 02/17/2017 sent to Dr. Crista ElliotHarbrecht. Done and new Insulin was sent.  Denied.  Message to Dr. Crista ElliotHarbrecht to change to preferred Humulin 70/30 KwikPen for greater compliance.  [facility]  Approved 02/20/2017 thru 02/20/2018 .  Angelina OkGladys Naphtali Riede, RN 02/20/2017 4:00 PM

## 2017-02-25 ENCOUNTER — Ambulatory Visit: Payer: MEDICAID | Admitting: Podiatry

## 2017-02-25 MED ORDER — DIPHENHYDRAMINE HCL 50 MG/ML IJ SOLN
50.00 mg | INTRAMUSCULAR | Status: DC
Start: ? — End: 2017-02-25

## 2017-02-25 MED ORDER — GENERIC EXTERNAL MEDICATION
1.00 mg | Status: DC
Start: ? — End: 2017-02-25

## 2017-02-25 MED ORDER — HALOPERIDOL LACTATE 5 MG/ML IJ SOLN
5.00 mg | INTRAMUSCULAR | Status: DC
Start: ? — End: 2017-02-25

## 2017-02-25 MED ORDER — ACETAMINOPHEN 325 MG PO TABS
650.00 mg | ORAL_TABLET | ORAL | Status: DC
Start: ? — End: 2017-02-25

## 2017-02-25 MED ORDER — HALOPERIDOL 5 MG PO TABS
5.00 mg | ORAL_TABLET | ORAL | Status: DC
Start: ? — End: 2017-02-25

## 2017-02-25 MED ORDER — LOSARTAN POTASSIUM 50 MG PO TABS
100.00 mg | ORAL_TABLET | ORAL | Status: DC
Start: 2017-02-26 — End: 2017-02-25

## 2017-02-25 MED ORDER — GLUCOSE 40 % PO GEL
15.00 g | ORAL | Status: DC
Start: ? — End: 2017-02-25

## 2017-02-25 MED ORDER — ALUMINUM-MAGNESIUM-SIMETHICONE 200-200-20 MG/5ML PO SUSP
30.00 mL | ORAL | Status: DC
Start: ? — End: 2017-02-25

## 2017-02-25 MED ORDER — BENZTROPINE MESYLATE 1 MG PO TABS
1.00 mg | ORAL_TABLET | ORAL | Status: DC
Start: ? — End: 2017-02-25

## 2017-02-25 MED ORDER — VORTIOXETINE HBR 5 MG PO TABS
20.00 mg | ORAL_TABLET | ORAL | Status: DC
Start: 2017-02-26 — End: 2017-02-25

## 2017-02-25 MED ORDER — ONDANSETRON 4 MG PO TBDP
4.00 mg | ORAL_TABLET | ORAL | Status: DC
Start: ? — End: 2017-02-25

## 2017-02-25 MED ORDER — DEXTROSE 50 % IV SOLN
12.00 g | INTRAVENOUS | Status: DC
Start: ? — End: 2017-02-25

## 2017-02-25 MED ORDER — BUPROPION HCL ER (XL) 150 MG PO TB24
150.00 mg | ORAL_TABLET | ORAL | Status: DC
Start: 2017-02-26 — End: 2017-02-25

## 2017-02-25 MED ORDER — INSULIN REGULAR HUMAN 100 UNIT/ML IJ SOLN
10.00 | INTRAMUSCULAR | Status: DC
Start: 2017-02-25 — End: 2017-02-25

## 2017-02-25 MED ORDER — INSULIN LISPRO 100 UNIT/ML ~~LOC~~ SOLN
3.00 | SUBCUTANEOUS | Status: DC
Start: ? — End: 2017-02-25

## 2017-02-25 MED ORDER — GABAPENTIN 100 MG PO CAPS
100.00 mg | ORAL_CAPSULE | ORAL | Status: DC
Start: 2017-02-25 — End: 2017-02-25

## 2017-02-25 MED ORDER — LORAZEPAM 2 MG/ML IJ SOLN
2.00 mg | INTRAMUSCULAR | Status: DC
Start: ? — End: 2017-02-25

## 2017-02-25 MED ORDER — GENERIC EXTERNAL MEDICATION
1.00 | Status: DC
Start: 2017-02-26 — End: 2017-02-25

## 2017-02-25 MED ORDER — TOPIRAMATE 25 MG PO TABS
25.00 mg | ORAL_TABLET | ORAL | Status: DC
Start: 2017-02-25 — End: 2017-02-25

## 2017-02-25 MED ORDER — INSULIN REGULAR HUMAN 100 UNIT/ML IJ SOLN
20.00 | INTRAMUSCULAR | Status: DC
Start: 2017-02-26 — End: 2017-02-25

## 2017-02-25 MED ORDER — NAPROXEN 250 MG PO TABS
500.00 mg | ORAL_TABLET | ORAL | Status: DC
Start: 2017-02-25 — End: 2017-02-25

## 2017-02-25 MED ORDER — TEMAZEPAM 15 MG PO CAPS
45.00 mg | ORAL_CAPSULE | ORAL | Status: DC
Start: 2017-02-25 — End: 2017-02-25

## 2017-02-25 MED ORDER — HYDROXYZINE HCL 50 MG PO TABS
50.00 mg | ORAL_TABLET | ORAL | Status: DC
Start: ? — End: 2017-02-25

## 2017-02-25 MED ORDER — ZIPRASIDONE MESYLATE 20 MG IM SOLR
20.00 mg | INTRAMUSCULAR | Status: DC
Start: ? — End: 2017-02-25

## 2017-02-25 MED ORDER — CLONIDINE HCL 0.1 MG PO TABS
0.10 mg | ORAL_TABLET | ORAL | Status: DC
Start: ? — End: 2017-02-25

## 2017-03-02 ENCOUNTER — Ambulatory Visit: Payer: MEDICAID | Admitting: Podiatry

## 2017-03-04 ENCOUNTER — Ambulatory Visit (INDEPENDENT_AMBULATORY_CARE_PROVIDER_SITE_OTHER): Payer: Medicaid Other

## 2017-03-04 ENCOUNTER — Encounter: Payer: Self-pay | Admitting: Podiatry

## 2017-03-04 ENCOUNTER — Ambulatory Visit: Payer: Medicaid Other | Admitting: Podiatry

## 2017-03-04 ENCOUNTER — Other Ambulatory Visit: Payer: Self-pay | Admitting: Podiatry

## 2017-03-04 VITALS — BP 136/86 | HR 87 | Resp 16

## 2017-03-04 DIAGNOSIS — M7750 Other enthesopathy of unspecified foot: Secondary | ICD-10-CM

## 2017-03-04 DIAGNOSIS — M7751 Other enthesopathy of right foot: Secondary | ICD-10-CM | POA: Diagnosis not present

## 2017-03-04 DIAGNOSIS — E1140 Type 2 diabetes mellitus with diabetic neuropathy, unspecified: Secondary | ICD-10-CM

## 2017-03-04 DIAGNOSIS — M779 Enthesopathy, unspecified: Secondary | ICD-10-CM

## 2017-03-04 DIAGNOSIS — M79671 Pain in right foot: Secondary | ICD-10-CM

## 2017-03-04 DIAGNOSIS — L84 Corns and callosities: Secondary | ICD-10-CM

## 2017-03-04 DIAGNOSIS — E1149 Type 2 diabetes mellitus with other diabetic neurological complication: Secondary | ICD-10-CM

## 2017-03-04 MED ORDER — TRIAMCINOLONE ACETONIDE 10 MG/ML IJ SUSP
10.0000 mg | Freq: Once | INTRAMUSCULAR | Status: AC
Start: 2017-03-04 — End: 2017-03-04
  Administered 2017-03-04: 10 mg

## 2017-03-04 NOTE — Patient Instructions (Signed)

## 2017-03-04 NOTE — Progress Notes (Signed)
Subjective:   Patient ID: Brian Day, male   DOB: 36 y.o.   MRN: [id number]   HPI Patient presents stating that he has had a lot of pain in his right foot and has had a history of fracture osteomyelitis and has extreme obesity and is hoping for weight reduction surgery.  Patient smokes a small amount and does use snuff and would like to be more active   Review of Systems  All other systems reviewed and are negative.       Objective:  Physical Exam  Constitutional: He appears well-developed and well-nourished.  Cardiovascular: Intact distal pulses.  Pulmonary/Chest: Effort normal.  Musculoskeletal: Normal range of motion.  Neurological: He is alert.  Skin: Skin is warm.  Nursing note and vitals reviewed.   Vascular status was found to be intact with diminishment of sharp dull and vibratory bilateral.  Patient has extreme obesity which puts a lot of stress on his feet and has keratotic lesion distal third right and inflammation and pain in the lateral side of the right foot around the peroneal tendon.  Where there is been previous fracture minimal discomfort is noted upon palpation and patient was noted to have good digital perfusion.     Assessment:  Very poor health individual with extreme obesity with inflammatory peroneal tendinitis and keratotic lesion with no active lesions currently and no indications of ulceration.     Plan:  Reviewed H&P and x-rays at this time with patient.  At this point lateral injection administered to the peroneal tendon group 3 mg dexamethasone Kenalog 5 mg Xylocaine and advised on reduced activity.  Patient can have these done periodically but I do not see any surgical intervention that would be of benefit to him and I did debride some lesions today with no drainage noted.  X-rays indicate that there is been a fracture of the fourth metatarsal right and there is arthritis but no indications of osteomyelitic changes

## 2017-03-25 NOTE — Progress Notes (Deleted)
  Subjective:  HPI: [name] is a 37 y.o. male who presents for ***  Please see Assessment and Plan below for the status of his chronic medical problems.  Review of Systems: ROS  Objective:  Physical Exam: There were no vitals filed for this visit. Physical Exam Assessment & Plan:  No problem-specific Assessment & Plan notes found for this encounter.   Medications Ordered No orders of the defined types were placed in this encounter.  Other Orders No orders of the defined types were placed in this encounter.  Follow Up: No Follow-up on file.

## 2017-03-26 ENCOUNTER — Encounter: Payer: Medicaid Other | Admitting: Internal Medicine

## 2017-04-02 ENCOUNTER — Encounter: Payer: Medicaid Other | Admitting: Internal Medicine

## 2018-01-19 ENCOUNTER — Encounter: Payer: Self-pay | Admitting: Podiatry

## 2018-01-19 NOTE — Progress Notes (Signed)
Medical records were emailed to [email or web address].

## 2018-04-19 IMAGING — DX DG CHEST 2V
3 series · 3 of 3 positions shown · non-contrast
Comparison: 08/23/2014

CLINICAL DATA: Patient is weak, cough, states he woke up and is
feeling unwell. Pain in right foot with previous break in 4666. Hx
of CAP, hypertension, and diabetes.

EXAM:
CHEST  2 VIEW

[chest pa]
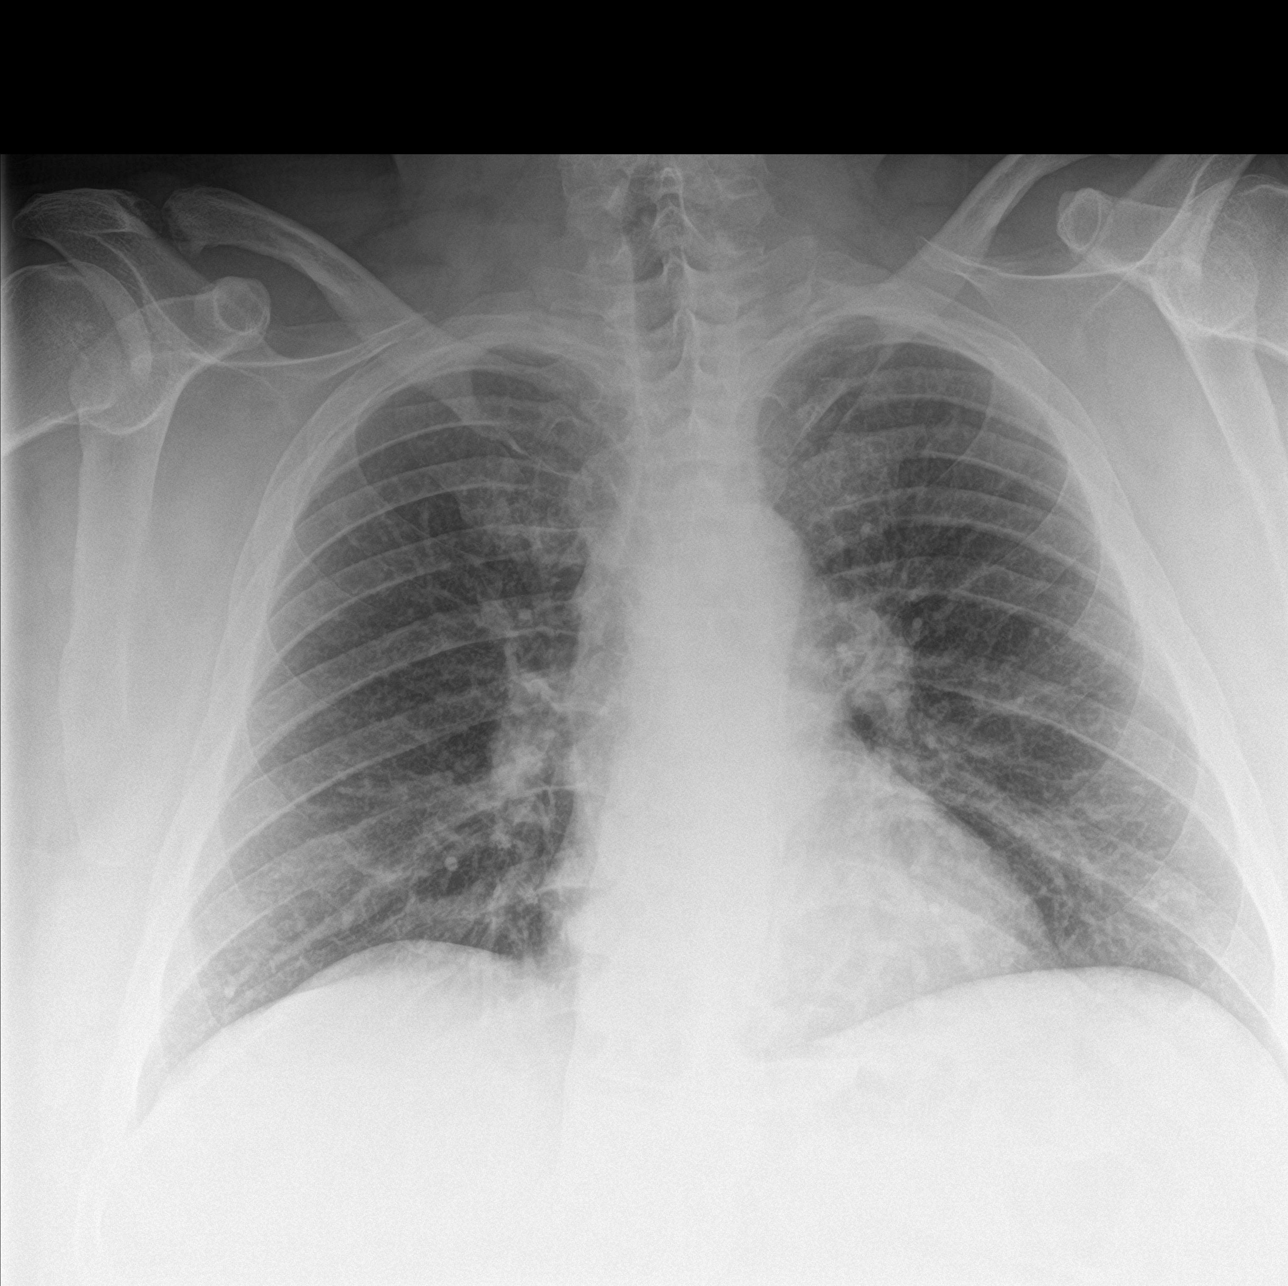

[chest lat (1 of 2)]
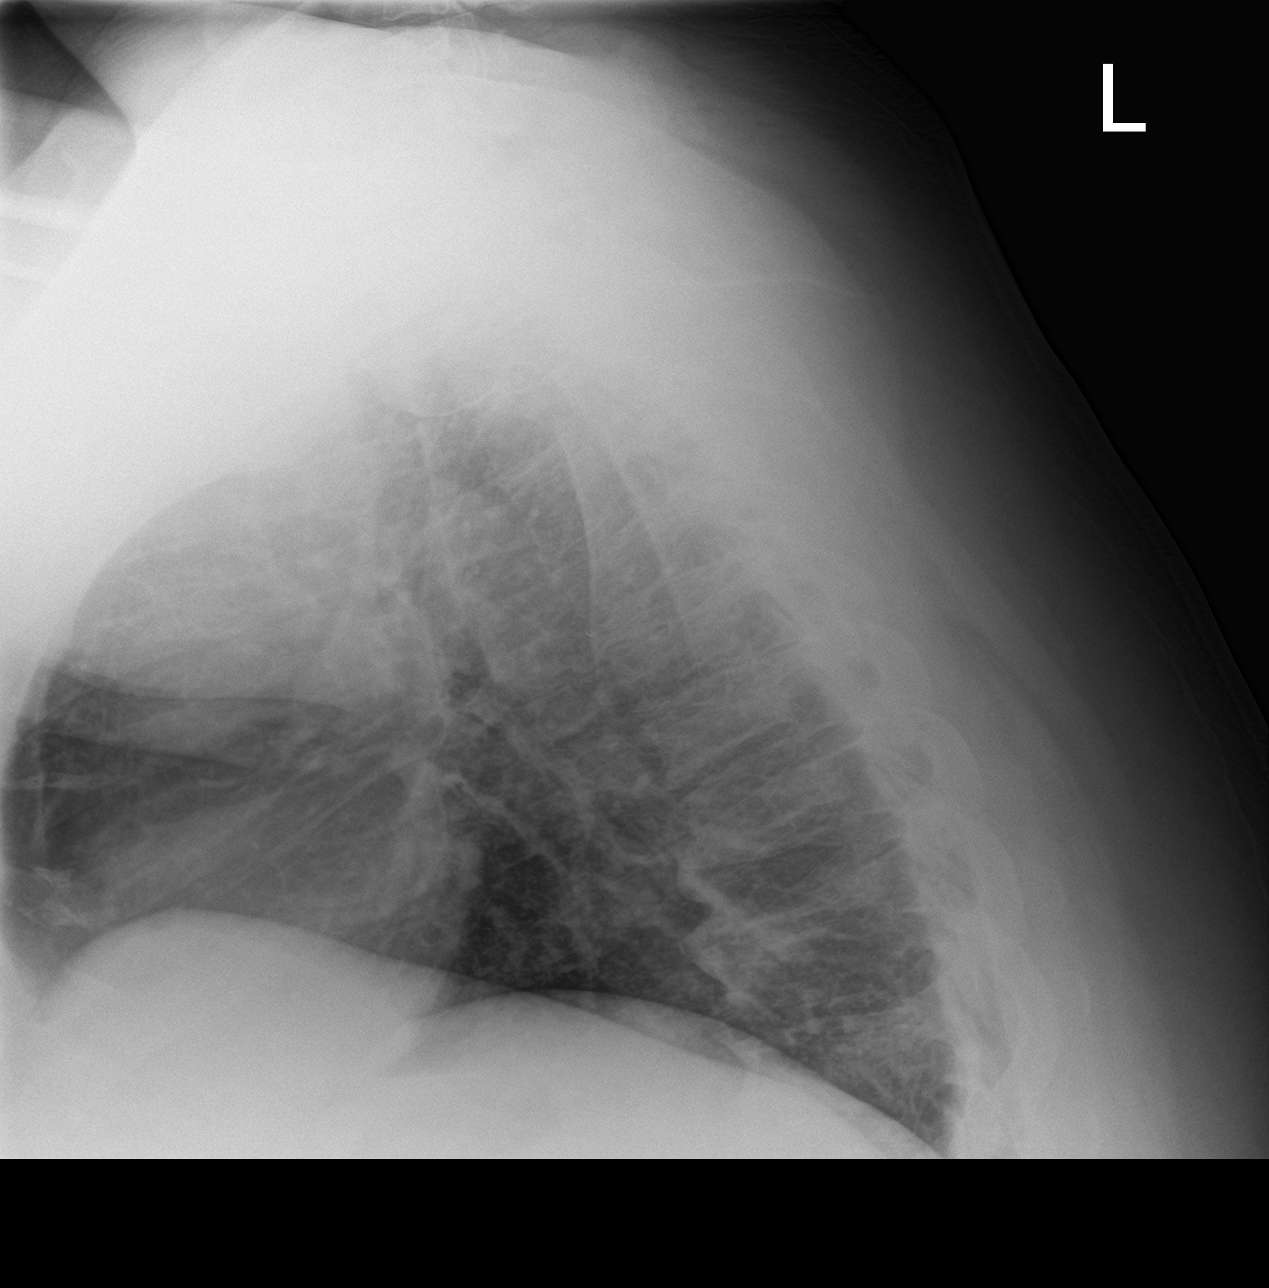

[chest lat (2 of 2)]
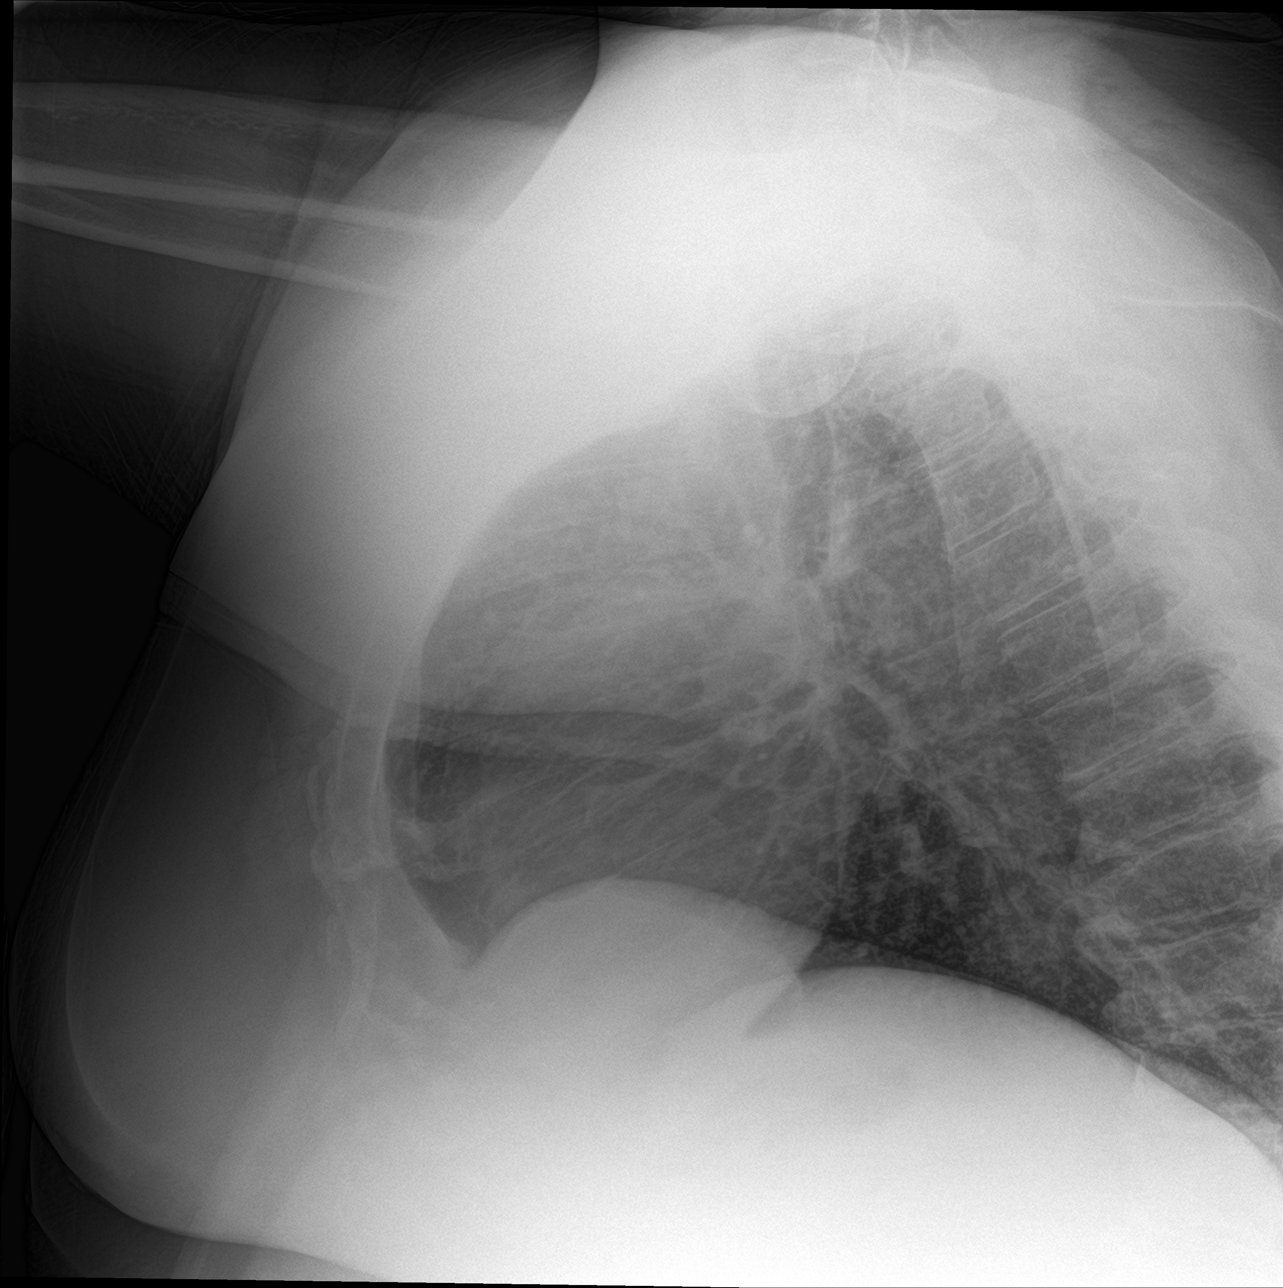

[3 of 3 positions shown; findings below may reference images not displayed]

FINDINGS: Lateral view degraded by patient arm position. Moderate thoracic
spondylosis. Midline trachea. Normal heart size and mediastinal
contours. No pleural effusion or pneumothorax. Diffuse peribronchial
thickening. No lobar consolidation.
IMPRESSION: No acute cardiopulmonary disease.

Peribronchial thickening which may relate to chronic bronchitis or
smoking.

## 2018-11-23 HISTORY — PX: LAPAROSCOPIC GASTRIC SLEEVE RESECTION: SHX5895

## 2019-08-30 ENCOUNTER — Encounter (HOSPITAL_BASED_OUTPATIENT_CLINIC_OR_DEPARTMENT_OTHER): Payer: Self-pay | Admitting: *Deleted

## 2019-08-30 ENCOUNTER — Other Ambulatory Visit: Payer: Self-pay

## 2019-08-30 ENCOUNTER — Emergency Department (HOSPITAL_BASED_OUTPATIENT_CLINIC_OR_DEPARTMENT_OTHER)
Admission: EM | Admit: 2019-08-30 | Discharge: 2019-08-30 | Disposition: A | Discharge status: Home / Self Care | Payer: Medicaid Other | Attending: Emergency Medicine | Admitting: Emergency Medicine

## 2019-08-30 DIAGNOSIS — Z794 Long term (current) use of insulin: Secondary | ICD-10-CM | POA: Diagnosis not present

## 2019-08-30 DIAGNOSIS — E1165 Type 2 diabetes mellitus with hyperglycemia: Secondary | ICD-10-CM | POA: Insufficient documentation

## 2019-08-30 DIAGNOSIS — Z7984 Long term (current) use of oral hypoglycemic drugs: Secondary | ICD-10-CM | POA: Diagnosis not present

## 2019-08-30 DIAGNOSIS — N492 Inflammatory disorders of scrotum: Secondary | ICD-10-CM | POA: Insufficient documentation

## 2019-08-30 DIAGNOSIS — Z87891 Personal history of nicotine dependence: Secondary | ICD-10-CM | POA: Diagnosis not present

## 2019-08-30 DIAGNOSIS — Z79899 Other long term (current) drug therapy: Secondary | ICD-10-CM | POA: Insufficient documentation

## 2019-08-30 DIAGNOSIS — Z888 Allergy status to other drugs, medicaments and biological substances status: Secondary | ICD-10-CM | POA: Insufficient documentation

## 2019-08-30 DIAGNOSIS — I10 Essential (primary) hypertension: Secondary | ICD-10-CM | POA: Diagnosis not present

## 2019-08-30 DIAGNOSIS — R000 Tachycardia, unspecified: Secondary | ICD-10-CM | POA: Insufficient documentation

## 2019-08-30 DIAGNOSIS — E1140 Type 2 diabetes mellitus with diabetic neuropathy, unspecified: Secondary | ICD-10-CM | POA: Diagnosis not present

## 2019-08-30 LAB — BASIC METABOLIC PANEL
Anion gap: 10 (ref 5–15)
BUN: 10 mg/dL (ref 6–20)
CO2: 27 mmol/L (ref 22–32)
Calcium: 8.5 mg/dL — ABNORMAL LOW (ref 8.9–10.3)
Chloride: 98 mmol/L (ref 98–111)
Creatinine, Ser: 0.74 mg/dL (ref 0.61–1.24)
GFR calc Af Amer: >60 mL/min (ref >60)
GFR calc non Af Amer: >60 mL/min (ref >60)
Glucose, Bld: 152 mg/dL — ABNORMAL HIGH (ref 70–99)
Potassium: 3.7 mmol/L (ref 3.5–5.1)
Sodium: 135 mmol/L (ref 135–145)

## 2019-08-30 LAB — CBC WITH DIFFERENTIAL/PLATELET
Abs Immature Granulocytes: 0.02 10*3/uL (ref 0.00–0.07)
Basophils Absolute: 0 10*3/uL (ref 0.0–0.1)
Basophils Relative: 0 %
Eosinophils Absolute: 0 10*3/uL (ref 0.0–0.5)
Eosinophils Relative: 1 %
HCT: 44 % (ref 39.0–52.0)
Hemoglobin: 15.1 g/dL (ref 13.0–17.0)
Immature Granulocytes: 0 %
Lymphocytes Relative: 17 %
Lymphs Abs: 1 10*3/uL (ref 0.7–4.0)
MCH: 33.2 pg (ref 26.0–34.0)
MCHC: 34.3 g/dL (ref 30.0–36.0)
MCV: 96.7 fL (ref 80.0–100.0)
Monocytes Absolute: 0.5 10*3/uL (ref 0.1–1.0)
Monocytes Relative: 9 %
Neutro Abs: 4.5 10*3/uL (ref 1.7–7.7)
Neutrophils Relative %: 73 %
Platelets: 142 10*3/uL — ABNORMAL LOW (ref 150–400)
RBC: 4.55 MIL/uL (ref 4.22–5.81)
RDW: 12.1 % (ref 11.5–15.5)
WBC: 6.1 10*3/uL (ref 4.0–10.5)
nRBC: 0 % (ref 0.0–0.2)

## 2019-08-30 MED ORDER — LIDOCAINE HCL 2 % IJ SOLN
10.0000 mL | Freq: Once | INTRAMUSCULAR | Status: AC
  Administered 2019-08-30: 200 mg via INTRADERMAL
  Filled 2019-08-30: qty 20

## 2019-08-30 MED ORDER — ACETAMINOPHEN 325 MG PO TABS
650.0000 mg | ORAL_TABLET | Freq: Once | ORAL | Status: AC
  Administered 2019-08-30: 650 mg via ORAL
  Filled 2019-08-30: qty 2

## 2019-08-30 MED ORDER — SODIUM CHLORIDE 0.9 % IV BOLUS
1000.0000 mL | Freq: Once | INTRAVENOUS | Status: AC
  Administered 2019-08-30: 1000 mL via INTRAVENOUS

## 2019-08-30 NOTE — ED Notes (Signed)
ED Provider at bedside. 

## 2019-08-30 NOTE — ED Notes (Signed)
Multiple attempts/ multiple staff  for second blood cultutre , no success.

## 2019-08-30 NOTE — ED Triage Notes (Signed)
C/o abcess to left thigh x 3 days

## 2019-08-30 NOTE — ED Provider Notes (Signed)
[facility] EMERGENCY DEPARTMENT Provider Note   CSN: [id number] Arrival date & time: 08/30/19  1710     History Chief Complaint  Patient presents with  . Abscess    Brian Day is a 39 y.o. male.  HPI   39 year old male with a history of opium addiction, anxiety, arthritis, [facility] pneumonia, depression, GERD, hypertension, migraines, morbid obesity, diabetes, presents emergency department today complaining of an abscess to the scrotum that has been present for the last several days.  Notes that there is redness, swelling and pain to the area.  He has had no drainage from the wound.  He states he was seen by his PCP yesterday and was started on clindamycin but symptoms have not improved so he came here for further evaluation.  Past Medical History:  Diagnosis Date  . Addiction, opium ([place])   . Anxiety   . Arthritis    "right knee" (07/15/2016)  . CAP (community acquired pneumonia) 05/2014   hx/notes 06/07/2014  . Depression   . GERD (gastroesophageal reflux disease)   . Hypertension   . Migraine    "once q 3-4 years" (07/15/2016)  . Morbid obesity ([place])   . Type II diabetes mellitus [facility])     Patient Active Problem List   Diagnosis Date Noted  . Upper respiratory infection 01/23/2017  . 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 MDD (major depressive disorder), recurrent episode, moderate ([place]) 08/19/2016  . Idiopathic chronic gout of right ankle without tophus   . Chronic pain in right foot 07/15/2016  . Diabetic peripheral neuropathy ([place]) 12/29/2013  . Benign essential hypertension 10/27/2013  . Uncontrolled type 2 diabetes mellitus with complication, with long-term current use of insulin ([place]) 11/23/2012  . Morbid obesity ([place]) 11/23/2012  . Low back pain 11/23/2012    Past Surgical History:  Procedure Laterality Date  . KNEE ARTHROSCOPY Right 2000s        Family History  Problem Relation Age of Onset  . Diabetes Mother   . Hypertension Mother   . Hypertension Father     Social History   Tobacco Use  . Smoking status: Current Every Day Smoker    Packs/day: 0.50    Years: 12.00    Pack years: 6.00    Types: Cigarettes  . Smokeless tobacco: Former Systems developer    Types: Snuff, Chew  . Tobacco comment: 1/2 PACKE WILL LAST WEEK  Substance Use Topics  . Alcohol use: No  . Drug use: No    Comment: 07/15/2016 "nothing since 05/27/2016; was abusing pain pills"    Home Medications Prior to Admission medications   Medication Sig Start Date End Date Taking? Authorizing Provider  allopurinol (ZYLOPRIM) 100 MG tablet Take 1 tablet (100 mg total) by mouth 2 (two) times daily. Patient not taking: Reported on 12/06/2016 10/08/16   Lucious Groves, DO  blood glucose meter kit and supplies Dispense based on patient and insurance preference. Use up to four times daily as directed. (FOR ICD-9 250.00, 250.01). Patient not taking: Reported on 12/06/2016 07/22/16   Holley Raring, MD  diclofenac (VOLTAREN) 75 MG EC tablet Take 1 tablet (75 mg total) by mouth 2 (two) times daily. Patient not taking: Reported on 12/06/2016 09/25/16   Lucious Groves, DO  fluticasone [facility]) 50 MCG/ACT nasal spray Place 1 spray into both nostrils daily. 01/23/17   Collier Salina, MD  gabapentin (NEURONTIN) 300 MG capsule Take  3 capsules (900 mg total) by mouth 4 (four) times daily. 11/12/16 11/12/17  Lorella Nimrod, MD  glipiZIDE (GLUCOTROL) 5 MG tablet Take 0.5 tablets (2.5 mg total) by mouth daily before breakfast. Patient taking differently: Take 5 mg by mouth daily before breakfast.  10/08/16   Lucious Groves, DO  guaiFENesin-codeine (ROBITUSSIN AC) 100-10 MG/5ML syrup Take 5 mLs by mouth 3 (three) times daily as needed for cough. 01/23/17   Rice, Resa Miner, MD  hydrochlorothiazide (HYDRODIURIL) 25 MG tablet Take 1 tablet (25 mg total) by mouth daily. 10/08/16   Lucious Groves, DO  Insulin Isophane & Regular Human (HUMULIN 70/30 KWIKPEN) (70-30) 100 UNIT/ML PEN 65 Units with your first large meal in the morning and 35 units with your second large meal in the evening 02/18/17   Kathi Ludwig, MD  Insulin Syringe-Needle U-100 31G X 15/64" 1 ML MISC The patient is insulin requiring, ICD 10 code E10.9. The patient tests 4 times per day. 02/16/17   Kathi Ludwig, MD  omeprazole (PRILOSEC) 40 MG capsule Take 1 capsule (40 mg total) by mouth daily. 10/08/16   Lucious Groves, DO  sertraline (ZOLOFT) 100 MG tablet Take 0.5 tablets (50 mg total) by mouth daily. Patient taking differently: Take 200 mg by mouth daily.  10/08/16   Lucious Groves, DO  sodium chloride (OCEAN) 0.65 % SOLN nasal spray Place 1 spray into both nostrils as needed for congestion. 01/23/17   Collier Salina, MD    Allergies    Colchicine and Lisinopril  Review of Systems   Review of Systems  Constitutional: Negative for fever.  Respiratory: Negative for shortness of breath.   Cardiovascular: Negative for chest pain.  Gastrointestinal: Negative for abdominal pain.  Genitourinary: Positive for scrotal swelling. Negative for penile pain and penile swelling.  Musculoskeletal: Negative for back pain.  Neurological: Negative for headaches.    Physical Exam Updated Vital Signs BP 124/67 (BP Location: Left Arm)   Pulse 78   Temp 99.1 F (37.3 C) (Oral)   Resp 16   Ht 6' 3" (1.905 m)   Wt (!) 160.1 kg   SpO2 99%   BMI 44.12 kg/m   Physical Exam Constitutional:      General: He is not in acute distress.    Appearance: He is well-developed.  Eyes:     Conjunctiva/sclera: Conjunctivae normal.  Cardiovascular:     Rate and Rhythm: Tachycardia present.  Pulmonary:     Effort: Pulmonary effort is normal.  Genitourinary:    Comments: Chaperone present. Well demarcated 2cm area of induration to the scrotal wall with central area of fluctuance. Area is erythematous. There is  no crepitus or significant tracking of induration.  Skin:    General: Skin is warm and dry.  Neurological:     Mental Status: He is alert and oriented to person, place, and time.     ED Results / Procedures / Treatments   Labs (all labs ordered are listed, but only abnormal results are displayed) Labs Reviewed  CBC WITH DIFFERENTIAL/PLATELET - Abnormal; Notable for the following components:      Result Value   Platelets 142 (*)    All other components within normal limits  BASIC METABOLIC PANEL - Abnormal; Notable for the following components:   Glucose, Bld 152 (*)    Calcium 8.5 (*)    All other components within normal limits  CULTURE, BLOOD (ROUTINE X 2)  CULTURE, BLOOD (ROUTINE X 2)  EKG None  Radiology No results found.  Procedures .Marland KitchenIncision and Drainage  Date/Time: 08/30/2019 7:24 PM Performed by: Rodney Booze, PA-C Authorized by: Rodney Booze, PA-C   Consent:    Consent obtained:  Verbal   Consent given by:  Patient   Risks discussed:  Bleeding, incomplete drainage and pain   Alternatives discussed:  No treatment Location:    Type:  Abscess   Size:  2 Pre-procedure details:    Skin preparation:  Betadine Anesthesia (see MAR for exact dosages):    Anesthesia method:  Local infiltration   Local anesthetic:  Lidocaine 2% w/o epi Procedure type:    Complexity:  Simple Procedure details:    Needle aspiration: no     Incision types:  Stab incision   Incision depth:  Dermal   Scalpel blade:  11   Drainage:  Bloody   Drainage amount:  Moderate   Wound treatment:  Wound left open Post-procedure details:    Patient tolerance of procedure:  Tolerated well, no immediate complications   (including critical care time)  Medications Ordered in ED Medications  acetaminophen (TYLENOL) tablet 650 mg (650 mg Oral Given 08/30/19 1806)  lidocaine (XYLOCAINE) 2 % (with pres) injection 200 mg (200 mg Intradermal Given 08/30/19 1807)  sodium chloride 0.9 %  bolus 1,000 mL (1,000 mLs Intravenous New Bag/Given 08/30/19 1827)    ED Course  I have reviewed the triage vital signs and the nursing notes.  Pertinent labs & imaging results that were available during my care of the patient were reviewed by me and considered in my medical decision making (see chart for details).    MDM Rules/Calculators/A&P                      39 year old male with abscess to the scrotal wall.  He was initially somewhat tachycardic and febrile but he is nontoxic appearing.  On exam he has a well demarcated area of induration with a central area of fluctuance.  Bedside ultrasound was performed by supervising physician, Dr. Laverta Baltimore which showed well defined area of abscess.  Exam not consistent with deep space infection such as necrotizing fasciitis.  Will order labs, give Tylenol and ivf.   Patient with skin abscess amenable to incision and drainage.  Abscess was not large enough to warrant packing or drain. Encouraged home warm soaks and flushing.  Advised patient to continue his antibiotic and follow-up with his PCP for wound recheck in the next 2 to 3 days.  Advised on specific return precautions.  He voiced understanding of plan reasons to return.  All Questions answered.  Patient stable for discharge.   Final Clinical Impression(s) / ED Diagnoses Final diagnoses:  Abscess of scrotal wall    Rx / DC Orders ED Discharge Orders    None       Bishop Dublin 08/30/19 1925    Margette Fast, MD 08/31/19 1456

## 2019-08-30 NOTE — Discharge Instructions (Signed)
Continue taking the clindamycin as prescribed by your doctor.   Please follow-up in 48 hours for recheck of the abscess.  Please return to the emergency room immediately if you experience any new or worsening symptoms or any symptoms that indicate worsening infection such as fevers, increased redness/swelling/pain, warmth, or drainage from the affected area.

## 2019-09-04 LAB — CULTURE, BLOOD (ROUTINE X 2)
Culture: NO GROWTH
Special Requests: ADEQUATE

## 2020-01-14 ENCOUNTER — Other Ambulatory Visit: Payer: Self-pay

## 2020-01-14 ENCOUNTER — Observation Stay (HOSPITAL_BASED_OUTPATIENT_CLINIC_OR_DEPARTMENT_OTHER)
Admission: EM | Admit: 2020-01-14 | Discharge: 2020-01-15 | DRG: 638 | Discharge status: Against Medical Advice | Payer: Medicaid Other | Attending: Internal Medicine | Admitting: Internal Medicine

## 2020-01-14 ENCOUNTER — Encounter (HOSPITAL_BASED_OUTPATIENT_CLINIC_OR_DEPARTMENT_OTHER): Payer: Self-pay | Admitting: *Deleted

## 2020-01-14 DIAGNOSIS — Z79899 Other long term (current) drug therapy: Secondary | ICD-10-CM | POA: Diagnosis not present

## 2020-01-14 DIAGNOSIS — F1120 Opioid dependence, uncomplicated: Secondary | ICD-10-CM | POA: Diagnosis not present

## 2020-01-14 DIAGNOSIS — L97509 Non-pressure chronic ulcer of other part of unspecified foot with unspecified severity: Secondary | ICD-10-CM | POA: Diagnosis present

## 2020-01-14 DIAGNOSIS — L03115 Cellulitis of right lower limb: Secondary | ICD-10-CM | POA: Diagnosis not present

## 2020-01-14 DIAGNOSIS — E11621 Type 2 diabetes mellitus with foot ulcer: Secondary | ICD-10-CM | POA: Diagnosis not present

## 2020-01-14 DIAGNOSIS — Z8249 Family history of ischemic heart disease and other diseases of the circulatory system: Secondary | ICD-10-CM | POA: Diagnosis not present

## 2020-01-14 DIAGNOSIS — Z885 Allergy status to narcotic agent status: Secondary | ICD-10-CM

## 2020-01-14 DIAGNOSIS — E11628 Type 2 diabetes mellitus with other skin complications: Secondary | ICD-10-CM | POA: Diagnosis not present

## 2020-01-14 DIAGNOSIS — L97512 Non-pressure chronic ulcer of other part of right foot with fat layer exposed: Secondary | ICD-10-CM

## 2020-01-14 DIAGNOSIS — L0390 Cellulitis, unspecified: Secondary | ICD-10-CM | POA: Diagnosis not present

## 2020-01-14 DIAGNOSIS — F17210 Nicotine dependence, cigarettes, uncomplicated: Secondary | ICD-10-CM | POA: Diagnosis not present

## 2020-01-14 DIAGNOSIS — Z9884 Bariatric surgery status: Secondary | ICD-10-CM | POA: Diagnosis not present

## 2020-01-14 DIAGNOSIS — IMO0002 Reserved for concepts with insufficient information to code with codable children: Secondary | ICD-10-CM

## 2020-01-14 DIAGNOSIS — Z833 Family history of diabetes mellitus: Secondary | ICD-10-CM | POA: Diagnosis not present

## 2020-01-14 DIAGNOSIS — Z888 Allergy status to other drugs, medicaments and biological substances status: Secondary | ICD-10-CM | POA: Diagnosis not present

## 2020-01-14 DIAGNOSIS — Z6838 Body mass index (BMI) 38.0-38.9, adult: Secondary | ICD-10-CM | POA: Diagnosis not present

## 2020-01-14 DIAGNOSIS — Z794 Long term (current) use of insulin: Secondary | ICD-10-CM | POA: Diagnosis not present

## 2020-01-14 DIAGNOSIS — I10 Essential (primary) hypertension: Secondary | ICD-10-CM | POA: Diagnosis not present

## 2020-01-14 DIAGNOSIS — M79671 Pain in right foot: Secondary | ICD-10-CM | POA: Diagnosis present

## 2020-01-14 DIAGNOSIS — F419 Anxiety disorder, unspecified: Secondary | ICD-10-CM | POA: Diagnosis present

## 2020-01-14 DIAGNOSIS — K219 Gastro-esophageal reflux disease without esophagitis: Secondary | ICD-10-CM | POA: Diagnosis not present

## 2020-01-14 DIAGNOSIS — F909 Attention-deficit hyperactivity disorder, unspecified type: Secondary | ICD-10-CM | POA: Diagnosis present

## 2020-01-14 DIAGNOSIS — Z7984 Long term (current) use of oral hypoglycemic drugs: Secondary | ICD-10-CM | POA: Diagnosis not present

## 2020-01-14 DIAGNOSIS — F32A Depression, unspecified: Secondary | ICD-10-CM | POA: Diagnosis not present

## 2020-01-14 DIAGNOSIS — F1220 Cannabis dependence, uncomplicated: Secondary | ICD-10-CM | POA: Diagnosis not present

## 2020-01-14 DIAGNOSIS — Z20822 Contact with and (suspected) exposure to covid-19: Secondary | ICD-10-CM | POA: Diagnosis present

## 2020-01-14 DIAGNOSIS — E1165 Type 2 diabetes mellitus with hyperglycemia: Secondary | ICD-10-CM

## 2020-01-14 NOTE — ED Triage Notes (Addendum)
Pt reports he has wound on right foot by great toe x 1 month. States it came from wearing new boots. States the wound was healing but he started wearing the boots again and now it has reopened. Reports wound is draining and yellow and leg is swollen today. Pt states he used to have diabetes but had gastric sleeve surgery and has lost over 200lbs and since the weight loss his blood sugar has come down

## 2020-01-15 ENCOUNTER — Emergency Department (HOSPITAL_BASED_OUTPATIENT_CLINIC_OR_DEPARTMENT_OTHER): Payer: Medicaid Other

## 2020-01-15 ENCOUNTER — Encounter (HOSPITAL_COMMUNITY): Payer: Self-pay | Admitting: Internal Medicine

## 2020-01-15 ENCOUNTER — Inpatient Hospital Stay (HOSPITAL_COMMUNITY): Payer: Medicaid Other

## 2020-01-15 DIAGNOSIS — L089 Local infection of the skin and subcutaneous tissue, unspecified: Secondary | ICD-10-CM | POA: Diagnosis present

## 2020-01-15 DIAGNOSIS — E11628 Type 2 diabetes mellitus with other skin complications: Secondary | ICD-10-CM | POA: Diagnosis not present

## 2020-01-15 DIAGNOSIS — F909 Attention-deficit hyperactivity disorder, unspecified type: Secondary | ICD-10-CM | POA: Diagnosis present

## 2020-01-15 DIAGNOSIS — F1120 Opioid dependence, uncomplicated: Secondary | ICD-10-CM | POA: Diagnosis present

## 2020-01-15 LAB — BASIC METABOLIC PANEL
Anion gap: 10 (ref 5–15)
BUN: 12 mg/dL (ref 6–20)
CO2: 30 mmol/L (ref 22–32)
Calcium: 8.4 mg/dL — ABNORMAL LOW (ref 8.9–10.3)
Chloride: 97 mmol/L — ABNORMAL LOW (ref 98–111)
Creatinine, Ser: 0.62 mg/dL (ref 0.61–1.24)
GFR, Estimated: >60 mL/min (ref >60)
Glucose, Bld: 124 mg/dL — ABNORMAL HIGH (ref 70–99)
Potassium: 3.6 mmol/L (ref 3.5–5.1)
Sodium: 137 mmol/L (ref 135–145)

## 2020-01-15 LAB — CBC WITH DIFFERENTIAL/PLATELET
Abs Immature Granulocytes: 0.04 10*3/uL (ref 0.00–0.07)
Basophils Absolute: 0 10*3/uL (ref 0.0–0.1)
Basophils Relative: 0 %
Eosinophils Absolute: 0.2 10*3/uL (ref 0.0–0.5)
Eosinophils Relative: 2 %
HCT: 40.5 % (ref 39.0–52.0)
Hemoglobin: 14.2 g/dL (ref 13.0–17.0)
Immature Granulocytes: 0 %
Lymphocytes Relative: 30 %
Lymphs Abs: 3 10*3/uL (ref 0.7–4.0)
MCH: 34.3 pg — ABNORMAL HIGH (ref 26.0–34.0)
MCHC: 35.1 g/dL (ref 30.0–36.0)
MCV: 97.8 fL (ref 80.0–100.0)
Monocytes Absolute: 1 10*3/uL (ref 0.1–1.0)
Monocytes Relative: 10 %
Neutro Abs: 5.6 10*3/uL (ref 1.7–7.7)
Neutrophils Relative %: 58 %
Platelets: 217 10*3/uL (ref 150–400)
RBC: 4.14 MIL/uL — ABNORMAL LOW (ref 4.22–5.81)
RDW: 11.7 % (ref 11.5–15.5)
WBC: 9.8 10*3/uL (ref 4.0–10.5)
nRBC: 0 % (ref 0.0–0.2)

## 2020-01-15 LAB — RESPIRATORY PANEL BY RT PCR (FLU A&B, COVID)
Influenza A by PCR: NEGATIVE
Influenza B by PCR: NEGATIVE
SARS Coronavirus 2 by RT PCR: NEGATIVE

## 2020-01-15 LAB — PREALBUMIN: Prealbumin: 10.4 mg/dL — ABNORMAL LOW (ref 18–38)

## 2020-01-15 LAB — HIV ANTIBODY (ROUTINE TESTING W REFLEX): HIV Screen 4th Generation wRfx: NONREACTIVE

## 2020-01-15 LAB — SEDIMENTATION RATE: Sed Rate: 49 mm/hr — ABNORMAL HIGH (ref 0–16)

## 2020-01-15 LAB — CBG MONITORING, ED: Glucose-Capillary: 119 mg/dL — ABNORMAL HIGH (ref 70–99)

## 2020-01-15 LAB — D-DIMER, QUANTITATIVE: D-Dimer, Quant: 0.49 ug/mL-FEU (ref 0.00–0.50)

## 2020-01-15 LAB — C-REACTIVE PROTEIN: CRP: 4.8 mg/dL — ABNORMAL HIGH (ref <1.0)

## 2020-01-15 MED ORDER — MELATONIN 3 MG PO TABS
3.0000 mg | ORAL_TABLET | Freq: Three times a day (TID) | ORAL | Status: DC
  Filled 2020-01-15: qty 1

## 2020-01-15 MED ORDER — INSULIN ASPART 100 UNIT/ML ~~LOC~~ SOLN: 0.0000 [IU] | Freq: Three times a day (TID) | SUBCUTANEOUS | Status: DC

## 2020-01-15 MED ORDER — LACTATED RINGERS IV SOLN: INTRAVENOUS | Status: DC

## 2020-01-15 MED ORDER — ENOXAPARIN SODIUM 80 MG/0.8ML ~~LOC~~ SOLN
70.0000 mg | SUBCUTANEOUS | Status: DC
  Administered 2020-01-15: 70 mg via SUBCUTANEOUS
  Filled 2020-01-15: qty 0.8

## 2020-01-15 MED ORDER — ONDANSETRON HCL 4 MG/2ML IJ SOLN: 4.0000 mg | Freq: Four times a day (QID) | INTRAMUSCULAR | Status: DC | PRN

## 2020-01-15 MED ORDER — COLLAGENASE 250 UNIT/GM EX OINT
TOPICAL_OINTMENT | Freq: Every day | CUTANEOUS | Status: DC
  Filled 2020-01-15: qty 30

## 2020-01-15 MED ORDER — GABAPENTIN 300 MG PO CAPS
900.0000 mg | ORAL_CAPSULE | Freq: Three times a day (TID) | ORAL | Status: DC
  Administered 2020-01-15: 900 mg via ORAL
  Filled 2020-01-15: qty 3

## 2020-01-15 MED ORDER — CEFAZOLIN SODIUM-DEXTROSE 2-4 GM/100ML-% IV SOLN
2.0000 g | Freq: Once | INTRAVENOUS | Status: AC
  Administered 2020-01-15: 2 g via INTRAVENOUS
  Filled 2020-01-15: qty 100

## 2020-01-15 MED ORDER — VANCOMYCIN HCL IN DEXTROSE 1-5 GM/200ML-% IV SOLN
1000.0000 mg | Freq: Once | INTRAVENOUS | Status: AC
  Administered 2020-01-15: 1000 mg via INTRAVENOUS
  Filled 2020-01-15: qty 200

## 2020-01-15 MED ORDER — LISDEXAMFETAMINE DIMESYLATE 20 MG PO CAPS
20.0000 mg | ORAL_CAPSULE | Freq: Every day | ORAL | Status: DC
  Administered 2020-01-15: 20 mg via ORAL
  Filled 2020-01-15: qty 1

## 2020-01-15 MED ORDER — SODIUM CHLORIDE 0.9 % IV SOLN
INTRAVENOUS | Status: DC | PRN
  Administered 2020-01-15: 250 mL via INTRAVENOUS

## 2020-01-15 MED ORDER — METRONIDAZOLE IN NACL 5-0.79 MG/ML-% IV SOLN
500.0000 mg | Freq: Three times a day (TID) | INTRAVENOUS | Status: DC
  Administered 2020-01-15: 500 mg via INTRAVENOUS
  Filled 2020-01-15: qty 100

## 2020-01-15 MED ORDER — ACETAMINOPHEN 325 MG PO TABS: 650.0000 mg | ORAL_TABLET | Freq: Four times a day (QID) | ORAL | Status: DC | PRN

## 2020-01-15 MED ORDER — BUPRENORPHINE HCL-NALOXONE HCL 8-2 MG SL SUBL
2.0000 | SUBLINGUAL_TABLET | Freq: Every day | SUBLINGUAL | Status: DC
  Administered 2020-01-15: 2 via SUBLINGUAL
  Filled 2020-01-15: qty 2

## 2020-01-15 MED ORDER — ACETAMINOPHEN 650 MG RE SUPP: 650.0000 mg | Freq: Four times a day (QID) | RECTAL | Status: DC | PRN

## 2020-01-15 MED ORDER — HYDROCODONE-ACETAMINOPHEN 5-325 MG PO TABS: 1.0000 | ORAL_TABLET | ORAL | Status: DC | PRN

## 2020-01-15 MED ORDER — DOCUSATE SODIUM 100 MG PO CAPS: 100.0000 mg | ORAL_CAPSULE | Freq: Two times a day (BID) | ORAL | Status: DC

## 2020-01-15 MED ORDER — SODIUM CHLORIDE 0.9 % IV SOLN
2.0000 g | INTRAVENOUS | Status: DC
  Administered 2020-01-15: 2 g via INTRAVENOUS
  Filled 2020-01-15: qty 2

## 2020-01-15 MED ORDER — ONDANSETRON HCL 4 MG PO TABS: 4.0000 mg | ORAL_TABLET | Freq: Four times a day (QID) | ORAL | Status: DC | PRN

## 2020-01-15 MED ORDER — HYDRALAZINE HCL 20 MG/ML IJ SOLN: 5.0000 mg | INTRAMUSCULAR | Status: DC | PRN

## 2020-01-15 MED ORDER — INSULIN ASPART 100 UNIT/ML ~~LOC~~ SOLN: 0.0000 [IU] | Freq: Every day | SUBCUTANEOUS | Status: DC

## 2020-01-15 MED ORDER — ALBUTEROL SULFATE (2.5 MG/3ML) 0.083% IN NEBU: 2.5000 mg | INHALATION_SOLUTION | RESPIRATORY_TRACT | Status: DC | PRN

## 2020-01-15 MED ORDER — BISACODYL 5 MG PO TBEC: 5.0000 mg | DELAYED_RELEASE_TABLET | Freq: Every day | ORAL | Status: DC | PRN

## 2020-01-15 MED ORDER — ALBUTEROL SULFATE HFA 108 (90 BASE) MCG/ACT IN AERS
1.0000 | INHALATION_SPRAY | RESPIRATORY_TRACT | Status: DC | PRN
  Filled 2020-01-15: qty 6.7

## 2020-01-15 MED ORDER — POLYETHYLENE GLYCOL 3350 17 G PO PACK: 17.0000 g | PACK | Freq: Every day | ORAL | Status: DC | PRN

## 2020-01-15 NOTE — ED Notes (Signed)
EDP at bedside  

## 2020-01-15 NOTE — Consult Note (Signed)
WOC Nurse Consult Note: Reason for Consult:Right DFU, chronic, nonhealing Wound type: Full thickness, neuropathic Pressure Injury POA: N/A Measurement:To be obtained by the Bedside RN today prior to placement of first dressing change. Wound bed:red center with nonviable tissue in base (slough), macerated and elevated callus in the periphery. See photo in EMR from today. Drainage (amount, consistency, odor) small to moderate serous Periwound: edematous, erythematous Dressing procedure/placement/frequency: I have provided guidance for Nursing for once daily application of collagenase (Santyl) to the nonviable tissue in wound bed. This wound is likely to have an infection and requires provider assessment and follow up post discharge from acute care, prescriptive orthotics, etc.  Recommend consultation with Orthopedics (Dr. Lajoyce Corners) or Podiatry for wound debridement and conversations regarding digit salvage following radiographic evaluation for infection.  WOC nursing team will not follow, but will remain available to this patient, the nursing and medical teams.  Please re-consult if needed. Thanks, Ladona Mow, MSN, RN, GNP, [place]  Pager# [phone]

## 2020-01-15 NOTE — ED Notes (Signed)
Report given to Tiffany RN with [facility]  

## 2020-01-15 NOTE — Progress Notes (Addendum)
Pt returned from MRI and stated immediately that he would be leaving. Dayshift RN Sam,M. Obtained signed AMA form and I removed pt's  And assisted him to pack his belongings. Pt's family member went out to drive to front of hospital for pickup. I took patient in wheelchair to lobby, assisted him into the vehichle. He was very pleasant and appreciative all care provided. 01/15/2020 @ 1945  Manson Allan, RN  PAged M. Blount MD to inform that patient leaft AMA. 01/15/2020 @ 2003 Manson Allan, RN

## 2020-01-15 NOTE — ED Provider Notes (Signed)
[facility] Provider Note: Georgena Spurling, MD, FACEP  CSN: [id number] MRN: [id number] ARRIVAL: 01/14/20 at 2310 ROOM: [place]   HISTORY OF PRESENT ILLNESS  01/15/20 1:20 AM Brian Day is a 39 y.o. male who developed a right great toe wound about a month ago which he attributed to wearing new boots.  The wound healed and he started wearing the boots again and now the wound has recurred.  The wound is draining yellow fluid and his right leg became swollen yesterday.  He has a history of diabetes but had gastric sleeve surgery, lost over 200 pounds, and his blood sugar has been under control since.  He rates associated foot pain is a 5 out of 10, worse with movement or palpation.  He denies fever.   Past Medical History:  Diagnosis Date  . Addiction, opium ([place])   . Anxiety   . Arthritis    "right knee" (07/15/2016)  . CAP (community acquired pneumonia) 05/2014   hx/notes 06/07/2014  . Depression   . GERD (gastroesophageal reflux disease)   . Hypertension   . Migraine    "once q 3-4 years" (07/15/2016)  . Morbid obesity ([place])   . Type II diabetes mellitus ([place])     Past Surgical History:  Procedure Laterality Date  . KNEE ARTHROSCOPY Right 2000s  . LAPAROSCOPIC GASTRIC SLEEVE RESECTION      Family History  Problem Relation Age of Onset  . Diabetes Mother   . Hypertension Mother   . Hypertension Father     Social History   Tobacco Use  . Smoking status: Current Every Day Smoker    Packs/day: 0.50    Years: 12.00    Pack years: 6.00    Types: Cigarettes  . Smokeless tobacco: Former Systems developer    Types: Snuff, Chew  . Tobacco comment: 1/2 PACKE WILL LAST WEEK  Vaping Use  . Vaping Use: Never used  Substance Use Topics  . Alcohol use: No  . Drug use: Not Currently    Comment: 07/15/2016 "nothing since 05/27/2016; was abusing pain pills"    Prior to Admission medications   Medication Sig Start Date End Date Taking?  Authorizing Provider  allopurinol (ZYLOPRIM) 100 MG tablet Take 1 tablet (100 mg total) by mouth 2 (two) times daily. Patient not taking: Reported on 12/06/2016 10/08/16   Lucious Groves, DO  blood glucose meter kit and supplies Dispense based on patient and insurance preference. Use up to four times daily as directed. (FOR ICD-9 250.00, 250.01). Patient not taking: Reported on 12/06/2016 07/22/16   Holley Raring, MD  diclofenac (VOLTAREN) 75 MG EC tablet Take 1 tablet (75 mg total) by mouth 2 (two) times daily. Patient not taking: Reported on 12/06/2016 09/25/16   Lucious Groves, DO  fluticasone [facility]) 50 MCG/ACT nasal spray Place 1 spray into both nostrils daily. 01/23/17   Rice, Resa Miner, MD  gabapentin (NEURONTIN) 300 MG capsule Take 3 capsules (900 mg total) by mouth 4 (four) times daily. 11/12/16 11/12/17  Lorella Nimrod, MD  glipiZIDE (GLUCOTROL) 5 MG tablet Take 0.5 tablets (2.5 mg total) by mouth daily before breakfast. Patient taking differently: Take 5 mg by mouth daily before breakfast.  10/08/16   Lucious Groves, DO  guaiFENesin-codeine (ROBITUSSIN AC) 100-10 MG/5ML syrup Take 5 mLs by mouth 3 (three) times daily as needed for cough. 01/23/17   Collier Salina, MD  hydrochlorothiazide (HYDRODIURIL) 25 MG tablet Take 1  tablet (25 mg total) by mouth daily. 10/08/16   Lucious Groves, DO  Insulin Isophane & Regular Human (HUMULIN 70/30 KWIKPEN) (70-30) 100 UNIT/ML PEN 65 Units with your first large meal in the morning and 35 units with your second large meal in the evening 02/18/17   Kathi Ludwig, MD  Insulin Syringe-Needle U-100 31G X 15/64" 1 ML MISC The patient is insulin requiring, ICD 10 code E10.9. The patient tests 4 times per day. 02/16/17   Kathi Ludwig, MD  omeprazole (PRILOSEC) 40 MG capsule Take 1 capsule (40 mg total) by mouth daily. 10/08/16   Lucious Groves, DO  sertraline (ZOLOFT) 100 MG tablet Take 0.5 tablets (50 mg total) by mouth daily. Patient taking  differently: Take 200 mg by mouth daily.  10/08/16   Lucious Groves, DO  sodium chloride (OCEAN) 0.65 % SOLN nasal spray Place 1 spray into both nostrils as needed for congestion. 01/23/17   Collier Salina, MD    Allergies Colchicine and Lisinopril   REVIEW OF SYSTEMS  Negative except as noted here or in the History of Present Illness.   PHYSICAL EXAMINATION  Initial Vital Signs Blood pressure (!) 123/95, pulse 89, temperature 98.4 F (36.9 C), temperature source Oral, resp. rate 14, height 6' 3" (1.905 m), weight (!) 140.6 kg, SpO2 97 %.  Examination General: Well-developed, obese male in no acute distress; appearance consistent with age of record HENT: normocephalic; atraumatic Eyes: pupils equal, round and reactive to light; extraocular muscles intact Neck: supple Heart: regular rate and rhythm Lungs: clear to auscultation bilaterally Abdomen: soft; nondistended; nontender; bowel sounds present Extremities: No deformity; pulses normal; edema and warmth of right lower leg and foot with ulceration of right great toe with yellow drainage:        Neurologic: Somnolent but arousable; motor function intact in all extremities and symmetric; no facial droop Skin: Warm and dry Psychiatric: Normal mood and affect   RESULTS  Summary of this visit's results, reviewed and interpreted by myself:   EKG Interpretation  Date/Time:    Ventricular Rate:    PR Interval:    QRS Duration:   QT Interval:    QTC Calculation:   R Axis:     Text Interpretation:        Laboratory Studies: Results for orders placed or performed during the hospital encounter of 01/14/20 (from the past 24 hour(s))  CBG monitoring, ED     Status: Abnormal   Collection Time: 01/15/20  1:45 AM  Result Value Ref Range   Glucose-Capillary 119 (H) 70 - 99 mg/dL  Sedimentation rate     Status: Abnormal   Collection Time: 01/15/20  1:46 AM  Result Value Ref Range   Sed Rate 49 (H) 0 - 16 mm/hr  CBC  with Differential     Status: Abnormal   Collection Time: 01/15/20  2:00 AM  Result Value Ref Range   WBC 9.8 4.0 - 10.5 K/uL   RBC 4.14 (L) 4.22 - 5.81 MIL/uL   Hemoglobin 14.2 13.0 - 17.0 g/dL   HCT 40.5 39 - 52 %   MCV 97.8 80.0 - 100.0 fL   MCH 34.3 (H) 26.0 - 34.0 pg   MCHC 35.1 30.0 - 36.0 g/dL   RDW 11.7 11.5 - 15.5 %   Platelets 217 150 - 400 K/uL   nRBC 0.0 0.0 - 0.2 %   Neutrophils Relative % 58 %   Neutro Abs 5.6 1.7 - 7.7 K/uL  Lymphocytes Relative 30 %   Lymphs Abs 3.0 0.7 - 4.0 K/uL   Monocytes Relative 10 %   Monocytes Absolute 1.0 0.1 - 1.0 K/uL   Eosinophils Relative 2 %   Eosinophils Absolute 0.2 0.0 - 0.5 K/uL   Basophils Relative 0 %   Basophils Absolute 0.0 0.0 - 0.1 K/uL   Immature Granulocytes 0 %   Abs Immature Granulocytes 0.04 0.00 - 0.07 K/uL  Basic metabolic panel     Status: Abnormal   Collection Time: 01/15/20  2:00 AM  Result Value Ref Range   Sodium 137 135 - 145 mmol/L   Potassium 3.6 3.5 - 5.1 mmol/L   Chloride 97 (L) 98 - 111 mmol/L   CO2 30 22 - 32 mmol/L   Glucose, Bld 124 (H) 70 - 99 mg/dL   BUN 12 6 - 20 mg/dL   Creatinine, Ser 0.62 0.61 - 1.24 mg/dL   Calcium 8.4 (L) 8.9 - 10.3 mg/dL   GFR, Estimated >60 >60 mL/min   Anion gap 10 5 - 15  D-dimer, quantitative (not at [facility])     Status: None   Collection Time: 01/15/20  2:00 AM  Result Value Ref Range   D-Dimer, Quant 0.49 0.00 - 0.50 ug/mL-FEU  Respiratory Panel by RT PCR (Flu A&B, Covid) - Nasopharyngeal Swab     Status: None   Collection Time: 01/15/20  2:12 AM   Specimen: Nasopharyngeal Swab  Result Value Ref Range   SARS Coronavirus 2 by RT PCR NEGATIVE NEGATIVE   Influenza A by PCR NEGATIVE NEGATIVE   Influenza B by PCR NEGATIVE NEGATIVE   Imaging Studies: DG Tibia/Fibula Left  Result Date: 01/15/2020 CLINICAL DATA:  Right greater than left lower extremity swelling. EXAM: LEFT TIBIA AND FIBULA - 2 VIEW COMPARISON:  11/22/2018 FINDINGS: No evidence of acute fracture or  dislocation. No focal bone lesions. Anterior cortical thickening along the mid tibial region likely representing reactive bone formation. No destructive or expansile lesions. Achilles spur to the calcaneus. Soft tissues are unremarkable. IMPRESSION: No acute bony abnormalities. Probable reactive bone formation in the mid tibial region. Electronically Signed   By: Lucienne Capers M.D.   On: 01/15/2020 01:56   DG Tibia/Fibula Right  Result Date: 01/15/2020 CLINICAL DATA:  Right lower extremity swelling EXAM: RIGHT TIBIA AND FIBULA - 2 VIEW COMPARISON:  None. FINDINGS: There is no evidence of fracture or other focal bone lesions. Mild right knee osteoarthrosis. Soft tissues are unremarkable. IMPRESSION: No fracture or dislocation of the right tibia or fibula. Mild right knee osteoarthrosis. Electronically Signed   By: Ulyses Jarred M.D.   On: 01/15/2020 01:56   DG Foot 2 Views Right  Result Date: 01/15/2020 CLINICAL DATA:  Acute onset of swelling EXAM: RIGHT FOOT - 2 VIEW COMPARISON:  None. FINDINGS: There is large bony callus formation seen along the fourth proximal metatarsal shaft. No acute fracture is identified. Dorsal osteophytes seen in the midfoot. A hallux valgus deformity is noted. Area of ulceration with soft tissue swelling seen along the lateral margin of the first metatarsal. IMPRESSION: Negative. Electronically Signed   By: Prudencio Pair M.D.   On: 01/15/2020 01:54    ED COURSE and MDM  Nursing notes, initial and subsequent vitals signs, including pulse oximetry, reviewed and interpreted by myself.  Vitals:   01/14/20 2321 01/14/20 2323 01/15/20 0200  BP: (!) 123/95  (!) 121/53  Pulse: 89  65  Resp: 14  17  Temp: 98.4 F (36.9 C)  TempSrc: Oral    SpO2: 97%  96%  Weight:  (!) 140.6 kg   Height:  6' 3" (1.905 m)    Medications  ceFAZolin (ANCEF) IVPB 2g/100 mL premix (2 g Intravenous New Bag/Given 01/15/20 0335)  vancomycin (VANCOCIN) IVPB 1000 mg/200 mL premix (has no  administration in time range)  0.9 %  sodium chloride infusion (250 mLs Intravenous New Bag/Given 01/15/20 0334)    Drainage from wound sent for culture.   3:34 AM Ancef and vancomycin started for cellulitis.  The rapid onset of swelling and warmth of the right lower extremity in association with the patient's ulcer is concerning for an aggressive infection.  A deep vein thrombosis is less likely given normal D-dimer.  We will have the patient admitted for IV antibiotics.  3:57 AM Dr. Myna Hidalgo to admit to hospitalist service.   PROCEDURES  Procedures   ED DIAGNOSES     ICD-10-CM   1. Skin ulcer of right great toe with fat layer exposed ([place])  L97.512   2. Cellulitis of right lower leg  L03.115        Shanon Rosser, MD 01/15/20 [phone]

## 2020-01-15 NOTE — ED Notes (Signed)
Called report to Sharon RN.

## 2020-01-15 NOTE — H&P (Signed)
History and Physical    Brian Day VZC:[id number] DOB: Dec 08, 1980 DOA: 01/14/2020  PCP: Penni Bombard, PA Consultants: Raul Del - bariatric surgery; Posey Pronto - endocrinology; Welton Flakes - wound care  Patient coming from:  Home - lives with parents; NOK: Mother, Zollie Beckers, [phone]  Chief Complaint: Diabetic foot infection  HPI: Brian Day is a 39 y.o. male with medical history significant of obesity s/p bariatric surgery (done 12/02/18, current BMI 38.75); DM; HTN; opiate dependence (on Buprenorphine); and h/o diabetic R foot wound in April and May 2021 presenting with recurrent/worsening foot infection.  He reports that he was wearing boot and didn't realize it was causing a blister and it got infected and caused swelling in his leg.  Blister started maybe 3-4 weeks ago but the swelling started yesterday.  He has not been on antibiotics for it.  No fevers.  Glucose tends to run 80-100 usually.  He is able to afford his medications.  He reported at least 5 times that he is "very easy to get along with."  However, he also acknowledges a severe anxiety disorder that he self-medicates with frequent daily marijuana (it is "natural" and without apparent toxins since he grows it himself).  He reports that he will be unable to remain hospitalized for very long unless he is able to go out and routinely smoke marijuana.  He doesn't want any "chemicals" to help control his anxiety, including BZD, trazodone, hydroxyzine.  He is willing to use herbal medications including melatonin.  He also continues to smoke cigarettes post-bariatric surgery.  He is on buprenorphine and would prefer that we send him home with an IV and let him receive his antibiotics that way instead.    ED Course: [facility] to [facility] transfer, per Dr. Myna Hidalgo:  39 yr old male with obesity s/p bariatric surgery, IDDM, and HTN who developed ulcer at medial aspect right 1st MTP ~1 month ago, seemed to improve initially but now worsened  again with marked swelling and drainage over the past 2 days. No systemic signs of infection and plain radiographs negative for osteo but worsening fairly quickly and looks from pictures in chart to involve deep tissues, may need MRI. Started on IV abx. COVID screening test negative.   Review of Systems: As per HPI; otherwise review of systems reviewed and negative.   Ambulatory Status:  Ambulates without assistance  COVID Vaccine Status:   Complete  Past Medical History:  Diagnosis Date  . Addiction, opium ([place])   . Anxiety   . Arthritis    "right knee" (07/15/2016)  . CAP (community acquired pneumonia) 05/2014   hx/notes 06/07/2014  . Depression   . GERD (gastroesophageal reflux disease)   . Hypertension   . Migraine    "once q 3-4 years" (07/15/2016)  . Morbid obesity ([place])   . Type II diabetes mellitus ([place])     Past Surgical History:  Procedure Laterality Date  . KNEE ARTHROSCOPY Right 2000s  . LAPAROSCOPIC GASTRIC SLEEVE RESECTION  11/2018    Social History   Socioeconomic History  . Marital status: Single    Spouse name: Not on file  . Number of children: Not on file  . Years of education: Not on file  . Highest education level: Not on file  Occupational History  . Occupation: courier  Tobacco Use  . Smoking status: Current Every Day Smoker    Packs/day: 1.00    Years: 24.00    Pack years: 24.00    Types: Cigarettes  .  Smokeless tobacco: Former Systems developer    Types: Snuff, Chew  Vaping Use  . Vaping Use: Never used  Substance and Sexual Activity  . Alcohol use: No  . Drug use: Not Currently    Types: Marijuana    Comment: smokes regularly  . Sexual activity: Never  Other Topics Concern  . Not on file  Social History Narrative  . Not on file   Social Determinants of Health   Financial Resource Strain:   . Difficulty of Paying Living Expenses: Not on file  Food Insecurity:   . Worried About Charity fundraiser in the Last Year: Not on file  . Ran Out of  Food in the Last Year: Not on file  Transportation Needs:   . Lack of Transportation (Medical): Not on file  . Lack of Transportation (Non-Medical): Not on file  Physical Activity:   . Days of Exercise per Week: Not on file  . Minutes of Exercise per Session: Not on file  Stress:   . Feeling of Stress : Not on file  Social Connections:   . Frequency of Communication with Friends and Family: Not on file  . Frequency of Social Gatherings with Friends and Family: Not on file  . Attends Religious Services: Not on file  . Active Member of Clubs or Organizations: Not on file  . Attends Archivist Meetings: Not on file  . Marital Status: Not on file  Intimate Partner Violence:   . Fear of Current or Ex-Partner: Not on file  . Emotionally Abused: Not on file  . Physically Abused: Not on file  . Sexually Abused: Not on file    Allergies  Allergen Reactions  . Colchicine Other (See Comments)    Worse HA ever had, vomiting, left side pain "it about killed me"  . Lisinopril Other (See Comments) and Cough    Made throat dry also  . Morphine Itching  . Oxycodone Hcl     Family History  Problem Relation Age of Onset  . Diabetes Mother   . Hypertension Mother   . Hypertension Father     Prior to Admission medications   Medication Sig Start Date End Date Taking? Authorizing Provider  allopurinol (ZYLOPRIM) 100 MG tablet Take 1 tablet (100 mg total) by mouth 2 (two) times daily. Patient not taking: Reported on 12/06/2016 10/08/16   Lucious Groves, DO  blood glucose meter kit and supplies Dispense based on patient and insurance preference. Use up to four times daily as directed. (FOR ICD-9 250.00, 250.01). Patient not taking: Reported on 12/06/2016 07/22/16   Holley Raring, MD  cyclobenzaprine (FLEXERIL) 10 MG tablet Take 10 mg by mouth 3 (three) times daily as needed for muscle spasms. 01/09/20   [provider]  diclofenac (VOLTAREN) 75 MG EC tablet Take 1 tablet (75 mg  total) by mouth 2 (two) times daily. Patient not taking: Reported on 12/06/2016 09/25/16   Lucious Groves, DO  fluticasone [facility]) 50 MCG/ACT nasal spray Place 1 spray into both nostrils daily. 01/23/17   Rice, Resa Miner, MD  gabapentin (NEURONTIN) 300 MG capsule Take 3 capsules (900 mg total) by mouth 4 (four) times daily. Patient taking differently: Take 900 mg by mouth 3 (three) times daily.  11/12/16 11/12/17  Lorella Nimrod, MD  glipiZIDE (GLUCOTROL) 5 MG tablet Take 0.5 tablets (2.5 mg total) by mouth daily before breakfast. Patient taking differently: Take 5 mg by mouth daily before breakfast.  10/08/16   Lucious Groves,  DO  guaiFENesin-codeine (ROBITUSSIN AC) 100-10 MG/5ML syrup Take 5 mLs by mouth 3 (three) times daily as needed for cough. 01/23/17   Rice, Resa Miner, MD  hydrochlorothiazide (HYDRODIURIL) 25 MG tablet Take 1 tablet (25 mg total) by mouth daily. 10/08/16   Lucious Groves, DO  Insulin Isophane & Regular Human (HUMULIN 70/30 KWIKPEN) (70-30) 100 UNIT/ML PEN 65 Units with your first large meal in the morning and 35 units with your second large meal in the evening 02/18/17   Kathi Ludwig, MD  Insulin Syringe-Needle U-100 31G X 15/64" 1 ML MISC The patient is insulin requiring, ICD 10 code E10.9. The patient tests 4 times per day. 02/16/17   Kathi Ludwig, MD  omeprazole (PRILOSEC) 40 MG capsule Take 1 capsule (40 mg total) by mouth daily. 10/08/16   Lucious Groves, DO  sertraline (ZOLOFT) 100 MG tablet Take 0.5 tablets (50 mg total) by mouth daily. Patient taking differently: Take 200 mg by mouth daily.  10/08/16   Lucious Groves, DO  sodium chloride (OCEAN) 0.65 % SOLN nasal spray Place 1 spray into both nostrils as needed for congestion. 01/23/17   Rice, Resa Miner, MD  VYVANSE 20 MG capsule Take 20 mg by mouth daily. 01/09/20   [provider]    Physical Exam: Vitals:   01/15/20 0429 01/15/20 0600 01/15/20 0751 01/15/20 0906  BP: 110/75 106/65  113/75 122/80  Pulse: 76 69  76  Resp: _0 Temp:    98.4 F (36.9 C)  TempSrc:    Oral  SpO2: 98% 96%  98%  Weight:      Height:         . General:  Appears calm and comfortable and is NAD, mildly anxious when discussing need for ongoing hospitalization . Eyes:   EOMI, normal lids, iris . ENT:  grossly normal hearing, lips & tongue, mmm; appropriate dentition . Neck:  no LAD, masses or thyromegaly . Cardiovascular:  RRR, no m/r/g.  . Respiratory:   CTA bilaterally with no wheezes/rales/rhonchi.  Normal respiratory effort. . Abdomen:  soft, NT, ND, NABS . Skin:  Large ulceration with purulent and foul-smelling drainage along the right MTP joint with surrounding erythema and edema extending along the foot and lower leg       . Musculoskeletal:  grossly normal tone BUE/BLE, good ROM, no bony abnormality except as above . Psychiatric:  Mildly anxious mood and affect, speech fluent and appropriate with poor insight/judgement, AOx3 . Neurologic:  CN 2-12 grossly intact, moves all extremities in coordinated fashion    Radiological Exams on Admission: Independently reviewed - see discussion in A/P where applicable  DG Tibia/Fibula Left  Result Date: 01/15/2020 CLINICAL DATA:  Right greater than left lower extremity swelling. EXAM: LEFT TIBIA AND FIBULA - 2 VIEW COMPARISON:  11/22/2018 FINDINGS: No evidence of acute fracture or dislocation. No focal bone lesions. Anterior cortical thickening along the mid tibial region likely representing reactive bone formation. No destructive or expansile lesions. Achilles spur to the calcaneus. Soft tissues are unremarkable. IMPRESSION: No acute bony abnormalities. Probable reactive bone formation in the mid tibial region. Electronically Signed   By: Lucienne Capers M.D.   On: 01/15/2020 01:56   DG Tibia/Fibula Right  Result Date: 01/15/2020 CLINICAL DATA:  Right lower extremity swelling EXAM: RIGHT TIBIA AND FIBULA - 2 VIEW COMPARISON:   None. FINDINGS: There is no evidence of fracture or other focal bone lesions. Mild right knee osteoarthrosis. Soft tissues are  unremarkable. IMPRESSION: No fracture or dislocation of the right tibia or fibula. Mild right knee osteoarthrosis. Electronically Signed   By: Ulyses Jarred M.D.   On: 01/15/2020 01:56   DG Foot 2 Views Right  Result Date: 01/15/2020 CLINICAL DATA:  Acute onset of swelling EXAM: RIGHT FOOT - 2 VIEW COMPARISON:  None. FINDINGS: There is large bony callus formation seen along the fourth proximal metatarsal shaft. No acute fracture is identified. Dorsal osteophytes seen in the midfoot. A hallux valgus deformity is noted. Area of ulceration with soft tissue swelling seen along the lateral margin of the first metatarsal. IMPRESSION: Negative. Electronically Signed   By: Prudencio Pair M.D.   On: 01/15/2020 01:54    EKG: not done   Labs on Admission: I have personally reviewed the available labs and imaging studies at the time of the admission.  Pertinent labs:   Glucose 124 Unremarkable CBC ESR 49 D-dimer 0.49 COVID/flu negative   Assessment/Plan Principal Problem:   Diabetic foot infection (HCC) Active Problems:   Uncontrolled type 2 diabetes mellitus with complication, with long-term current use of insulin (HCC)   Morbid obesity (HCC)   Benign essential hypertension   Opiate dependence, continuous (HCC)   ADHD (attention deficit hyperactivity disorder)    Diabetic foot infection -Foot ulcer is present and draining and now the patient has developed surrounding cellulitis -No SIRS criteria, no current concerns for sepsis -Will treat with IV antibiotics (Rocephin and Flagyl as per the diabetic foot ulcer algorithm) -Dr. Sharol Given will see the patient tomorrow AM -He has not yet failed antibiotic therapy but the wound appears to need debridement at a minimum -I have ordered ABIs in case revascularization may be indicated -MRI is pending -Patient is NPO after  midnight in case he needs a procedure tomorrow -LE wound order set utilized including labs (CRP, ESR, A1c, prealbumin, HIV, and blood cultures) and consults (diabetes coordinator; peripheral vascular navigator; TOC team; wound care; and nutrition)  Opiate dependence -Patient is on Buprenorphine, prescribed by Hubbard Robinson, PA -I have reviewed this patient in the [place] Controlled Substances Reporting System.  He is receiving medications from only one provider and appears to be taking them as prescribed. -He is at particularly high risk of opioid misuse, diversion, or overdose. -Discharge for outpatient IV antibiotics would be ill-advised, unless a locking mechanism would be available -Continue neurontin  Marijuana dependence -Cessation encouraged; this should be encouraged on an ongoing basis -UDS ordered -The patient appears very wedded to his THC use and is uncertain if he will be able to remain hospitalized without access to it; counseling was provided  ADHD/anxiety d/o -Patient reports severe underlying anxiety  -He self-medicates with THC -He declined psychiatry consultation -He prefers treatment without "chemicals" and is unwilling to use traditional pharmacotherapy -Will continue Vyvanse (which I don't usually do) -Will add standing TID melatonin as per discussion with patient  HTN -He is not currently taking medication for this issue  DM -Patient reports excellent home control -However, he appears to no longer be taking insulin/medication for this issue -Will check A1c -Will cover with resistant-scale SSI at this time  Obesity Body mass index is 38.75 kg/m. -He is s/p gastric sleeve bariatric surgery -ongoing weight loss should be encouraged -Outpatient PCP/bariatric medicine/bariatric surgery f/u encouraged     Note: This patient has been tested and is negative for the novel coronavirus COVID-19. The patient has been fully vaccinated against COVID-19.    DVT  prophylaxis:  Lovenox  Code Status:  Full - confirmed with patient Family Communication: None present Disposition Plan:  The patient is from: home  Anticipated d/c is to: home without [place] services_0  Anticipated d/c date will depend on clinical response to treatment, but likely 2-3 days depending on need for operative intervention  Patient is currently: acutely ill Consults called: Orthopedics; diabetes coordinator; peripheral vascular navigator; TOC team; wound care; and nutrition Admission status:  Admit - It is my clinical opinion that admission to INPATIENT is reasonable and necessary because of the expectation that this patient will require hospital care that crosses at least 2 midnights to treat this condition based on the medical complexity of the problems presented.  Given the aforementioned information, the predictability of an adverse outcome is felt to be significant.    Karmen Bongo MD [facility]   How to contact the [facility] Attending or Consulting provider [place] or covering provider during after hours [place], for this patient?  1. Check the care team in [facility] and look for a) attending/consulting [facility] provider listed and b) the [facility] team listed 2. Log into [email or web address] and use [place] universal password to access. If you do not have the password, please contact the hospital operator. 3. Locate the [facility] provider you are looking for under [facility] and page to a number that you can be directly reached. 4. If you still have difficulty reaching the provider, please page the [facility] (Director on Call) for the Hospitalists listed on amion for assistance.   01/15/2020, 11:50 AM

## 2020-01-15 NOTE — ED Notes (Signed)
Spoke to pt mother, Waynetta Sandy, per pt request; mother updated. [phone]

## 2020-01-16 ENCOUNTER — Telehealth (HOSPITAL_COMMUNITY): Payer: Self-pay

## 2020-01-16 LAB — HEMOGLOBIN A1C
Hgb A1c MFr Bld: 5.7 % — ABNORMAL HIGH (ref 4.8–5.6)
Mean Plasma Glucose: 117 mg/dL

## 2020-01-16 NOTE — Telephone Encounter (Signed)
PV Navigator Consult received and chart reviewed, as well as media photos. I was able to speak with patient via telephone to introduce myself and my role. Patient says he is still upset about his hospital visit, says "I have been through this before with this toe and I knew what they were going to tell me but people just kept on talking". Patient says he works and was not willing to be admitted and left AMA post having his MRI. Patient currently in route to his podiatrist - Dr Allena Katz at [facility], to discuss further action needed regarding care. He says " if the toe needs to be amputated, lets's do it so this thing can heal up and I can get back to work".  Denies other needs at this time. Patient has PV Navigator contact information and says he will call post podiatry visit today to communicate plans for care.   Hilma Favors RN BSN CWS PV Navigator [place]

## 2020-01-17 LAB — AEROBIC CULTURE W GRAM STAIN (SUPERFICIAL SPECIMEN)

## 2020-01-19 NOTE — Discharge Summary (Signed)
Discharge Summary  Brian Day BHA:[id number] DOB: [birth year]  PCP: Maye Hides, PA  Admit date: 01/14/2020 Discharge date: 01/19/2020   Time spent: 35 minutes  Admitted From: Home Disposition:  LEFT AMA  Recommendations for Outpatient Follow-up:  1. Follow up with PCP this week    Discharge Diagnoses:  Active Hospital Problems   Diagnosis Date Noted  . Diabetic foot infection (HCC) 01/15/2020  . Opiate dependence, continuous (HCC) 01/15/2020  . ADHD (attention deficit hyperactivity disorder) 01/15/2020  . Benign essential hypertension 10/27/2013  . Uncontrolled type 2 diabetes mellitus with complication, with long-term current use of insulin (HCC) 11/23/2012  . Morbid obesity (HCC) 11/23/2012    Resolved Hospital Problems  No resolved problems to display.    Discharge Condition: UNSTABLE, LEFT AMA  CODE STATUS: Full Diet recommendation:   Carb modified  Vitals:   01/15/20 0906 01/15/20 1431  BP: 122/80 108/67  Pulse: 76 (!) 58  Resp: 17 16  Temp: 98.4 F (36.9 C) 97.6 F (36.4 C)  SpO2: 98% 97%    History of present illness:  Brian Day is a 39 y.o. male with medical history significant of obesity s/p bariatric surgery (done 12/02/18, current BMI 38.75); DM; HTN; opiate dependence (on Buprenorphine); and h/o diabetic R foot wound in April and May 2021 presenting with recurrent/worsening foot infection.  He reports that he was wearing boot and didn't realize it was causing a blister and it got infected and caused swelling in his leg.  Blister started maybe 3-4 weeks ago but the swelling started yesterday.  He has not been on antibiotics for it.  No fevers.  Glucose tends to run 80-100 usually.  He is able to afford his medications.  He reported at least 5 times that he is "very easy to get along with."  However, he also acknowledges a severe anxiety disorder that he self-medicates with frequent daily marijuana (it is "natural" and without apparent  toxins since he grows it himself).  He reports that he will be unable to remain hospitalized for very long unless he is able to go out and routinely smoke marijuana.  He doesn't want any "chemicals" to help control his anxiety, including BZD, trazodone, hydroxyzine.  He is willing to use herbal medications including melatonin.  He also continues to smoke cigarettes post-bariatric surgery.  He is on buprenorphine and would prefer that we send him home with an IV and let him receive his antibiotics that way instead. Remaining hospital course addressed in problem based format below:   Hospital Course:   Diabetic foot infection -Foot ulcer is present and draining and now the patient has developed surrounding cellulitis -No SIRS criteria, no current concerns for sepsis -Received 1 dose of Rocephin and Flagyl as per the diabetic foot ulcer algorithm -Dr. Lajoyce Corners agreed to see the patient the following morning -The wound appears to need debridement at a minimum and he is at significant risk for need of amputation, particularly given his unwillingness to remain hospitalized -He left AMA after 1 dose of antibiotics  Opiate dependence -Patient is on Buprenorphine, prescribed by Laurence Aly, PA -I have reviewed this patient in the [place] Controlled Substances Reporting System.  He is receiving medications from only one provider and appears to be taking them as prescribed. -He is at particularly high risk of opioid misuse, diversion, or overdose. -Continue neurontin  Marijuana dependence -Cessation encouraged; this should be encouraged on an ongoing basis -UDS ordered -The patient appears very wedded to his THC use  and appears to have left AMA in order to continue to use  ADHD/anxiety d/o -Patient reports severe underlying anxiety  -He self-medicates with THC -He declined psychiatry consultation -He prefers treatment without "chemicals" and is unwilling to use traditional pharmacotherapy -Consider  transition of Vyvanse to alternative agent  HTN -He is not currently taking medication for this issue  DM -Patient reports excellent home control -He appears to no longer be taking insulin/medication for this issue -A1c is 5.7  Obesity Body mass index is 38.75 kg/m. -He is s/p gastric sleeve bariatric surgery -ongoing weight loss should be encouraged -Outpatient PCP/bariatric medicine/bariatric surgery f/u encouraged   Consultations:  Wound care  Others ordered but patient left AMA prior to evaluations    Discharge Exam: BP 108/67 (BP Location: Left Arm)   Pulse (!) 58   Temp 97.6 F (36.4 C)   Resp 16   Ht  (1.905 m)   Wt (!) 140.6 kg   SpO2 97%   BMI 38.75 kg/m   LEFT AMA - unable to perform further exam  Discharge Instructions Follow up: Please make an appointment to see your primary physician for follow up within 7 days of hospital discharge.  At that appointment:  -We routinely change or add medications that can affect your baseline labs and fluid status; therefore, you may require repeat blood work or tests during your next visit with your PCP.  Your PCP may decide not to get them or may add new tests based on their clinical decision.  -Please get all medicines reviewed and adjusted.  -Please request that your primary physician go over all hospital tests and procedure/radiological results at the follow up.  Please get all hospital records sent to your physician by signing a hospital release before you go home.  Activity: As tolerated with fall precautions; use walker/cane & assistance as needed.  Disposition: Home    Diet:   Heart Healthy.  For Heart failure patients - Check your weight at the same time daily.  If you gain over 2 pounds, develop leg swelling, or experience more shortness of breath/chest pain, call your Primary MD immediately. Follow cardiac low salt diet with no more than 1.5 liters/day of fluid.  For all patients - If you  experience worsening of your admission symptoms or develop shortness of breath, life threatening emergency, suicidal or homicidal thoughts you must seek medical attention immediately by calling 911 or calling your MD immediately.  Read complete instructions along with all the possible side effects for all the medicines you take and that have been prescribed to you. Take any new medicines after you have completely understood and accept all the possible adverse reactions/side effects.   Do not drive, operate heavy machinery, perform activities at heights, swimming or participation in water activities or provide baby sitting services if your were admitted for syncope/seizures until you have seen by Primary MD/Neurologist and advised to do so.  Do not drive when taking pain medications.    Do not take more than prescribed pain, sleep and anxiety medications.  Special Instructions: If you have smoked or chewed Tobacco  in the last 2 yrs please stop smoking; also stop any regular Alcohol and/or any Recreational drug use including marijuana.  Wear Seat belts while driving.   Please note:  You were cared for by a hospitalist during your hospital stay. If you have any questions about your discharge medications or the care you received while you were in the hospital, you can call  the unit and asked to speak with the hospitalist on call. Once you are discharged, your primary care physician will handle any further medical issues. Please note that NO REFILLS for any discharge medications will be authorized, as it is imperative that you return to your primary care physician (or establish a relationship with a primary care physician if you do not have one) for your aftercare needs so that they can reassess your need for medications and monitor your lab values.   Allergies as of 01/15/2020      Reactions   Colchicine Other (See Comments)   Worse HA ever had, vomiting, left side pain "it about killed me"    Lisinopril Other (See Comments), Cough   Made throat dry also   Morphine Itching   Oxycodone Hcl       Medication List    ASK your doctor about these medications   gabapentin 300 MG capsule Commonly known as: Neurontin Take 3 capsules (900 mg total) by mouth 4 (four) times daily.   Insulin Syringe-Needle U-100 31G X 15/64" 1 ML Misc The patient is insulin requiring, ICD 10 code E10.9. The patient tests 4 times per day.   Vyvanse 20 MG capsule Generic drug: lisdexamfetamine Take 20 mg by mouth daily.   Zubsolv 5.7-1.4 MG Subl Generic drug: Buprenorphine HCl-Naloxone HCl Take 1 tablet by mouth 2 (two) times daily as needed for pain.      Allergies  Allergen Reactions  . Colchicine Other (See Comments)    Worse HA ever had, vomiting, left side pain "it about killed me"  . Lisinopril Other (See Comments) and Cough    Made throat dry also  . Morphine Itching  . Oxycodone Hcl       The results of significant diagnostics from this hospitalization (including imaging, microbiology, ancillary and laboratory) are listed below for reference.    Significant Diagnostic Studies: DG Tibia/Fibula Left  Result Date: 01/15/2020 CLINICAL DATA:  Right greater than left lower extremity swelling. EXAM: LEFT TIBIA AND FIBULA - 2 VIEW COMPARISON:  11/22/2018 FINDINGS: No evidence of acute fracture or dislocation. No focal bone lesions. Anterior cortical thickening along the mid tibial region likely representing reactive bone formation. No destructive or expansile lesions. Achilles spur to the calcaneus. Soft tissues are unremarkable. IMPRESSION: No acute bony abnormalities. Probable reactive bone formation in the mid tibial region. Electronically Signed   By: Burman Nieves M.D.   On: 01/15/2020 01:56   DG Tibia/Fibula Right  Result Date: 01/15/2020 CLINICAL DATA:  Right lower extremity swelling EXAM: RIGHT TIBIA AND FIBULA - 2 VIEW COMPARISON:  None. FINDINGS: There is no evidence of  fracture or other focal bone lesions. Mild right knee osteoarthrosis. Soft tissues are unremarkable. IMPRESSION: No fracture or dislocation of the right tibia or fibula. Mild right knee osteoarthrosis. Electronically Signed   By: Deatra Robinson M.D.   On: 01/15/2020 01:56   MR FOOT RIGHT WO CONTRAST  Result Date: 01/15/2020 CLINICAL DATA:  Foot swelling diabetic osteomyelitis EXAM: MRI OF THE RIGHT FOREFOOT WITHOUT CONTRAST TECHNIQUE: Multiplanar, multisequence MR imaging of the right was performed. No intravenous contrast was administered. COMPARISON:  None. FINDINGS: Bones/Joint/Cartilage There is increased T2 hyperintense signal seen within the first metatarsal head areas of T1 hypointensity seen within the metatarsal head and base of the first phalanx. A hallux valgus deformity of the first MTP joint is seen. First MTP joint osteoarthritis with joint space and marginal osteophyte formation is noted. There a healed fracture deformity of  the fourth metatarsal surrounding bony callus formation seen. Ligaments The Lisfranc ligaments are intact. Muscles and Tendons There is diffuse fatty atrophy with increased signal seen throughout the muscles of the forefoot. The flexor and extensor tendons. Soft tissues There is a area ulceration seen the of the first metatarsal with subcutaneous edema. No loculated collections noted. There is edema within the plantar dorsal tissues. IMPRESSION: 1. Superficial area of ulceration along the lateral aspect of the first metatarsal head with findings that could be suggestive of early osteomyelitis involving the first metatarsal head and base of the first phalanx. 2. No definite tissue 3. Reactive marrow seen throughout the remainder the first metatarsal shaft phalanx. 4. Fracture deformity of the metatarsal with surrounding bony callus. Electronically Signed   By: Jonna Clark M.D.   On: 01/15/2020 20:04   DG Foot 2 Views Right  Result Date: 01/15/2020 CLINICAL DATA:  Acute  onset of swelling EXAM: RIGHT FOOT - 2 VIEW COMPARISON:  None. FINDINGS: There is large bony callus formation seen along the fourth proximal metatarsal shaft. No acute fracture is identified. Dorsal osteophytes seen in the midfoot. A hallux valgus deformity is noted. Area of ulceration with soft tissue swelling seen along the lateral margin of the first metatarsal. IMPRESSION: Negative. Electronically Signed   By: Jonna Clark M.D.   On: 01/15/2020 01:54    Microbiology: Recent Results (from the past 240 hour(s))  Wound or Superficial Culture     Status: None   Collection Time: 01/15/20  2:00 AM   Specimen: Toe; Wound  Result Value Ref Range Status   Specimen Description   Final    TOE RIGHT Performed at [facility], [address] [place], [place] [address]    Special Requests   Final    NONE Performed at [facility], [address] [place], [place] [address]    Gram Stain   Final    RARE WBC PRESENT, PREDOMINANTLY PMN FEW GRAM POSITIVE COCCI IN CLUSTERS Performed at [facility], [address], [place], [place] [address]    Culture FEW STAPHYLOCOCCUS AUREUS  Final   Report Status 01/17/2020 FINAL  Final   Organism ID, Bacteria STAPHYLOCOCCUS AUREUS  Final      Susceptibility   Staphylococcus aureus - MIC*    CIPROFLOXACIN <=0.5 SENSITIVE Sensitive     ERYTHROMYCIN <=0.25 SENSITIVE Sensitive     GENTAMICIN <=0.5 SENSITIVE Sensitive     OXACILLIN 0.5 SENSITIVE Sensitive     TETRACYCLINE <=1 SENSITIVE Sensitive     VANCOMYCIN 1 SENSITIVE Sensitive     TRIMETH/SULFA <=10 SENSITIVE Sensitive     CLINDAMYCIN <=0.25 SENSITIVE Sensitive     RIFAMPIN <=0.5 SENSITIVE Sensitive     Inducible Clindamycin NEGATIVE Sensitive     * FEW STAPHYLOCOCCUS AUREUS  Respiratory Panel by RT PCR (Flu A&B, Covid) - Nasopharyngeal Swab     Status: None   Collection Time: 01/15/20  2:12 AM   Specimen: Nasopharyngeal Swab  Result Value Ref Range Status   SARS  Coronavirus 2 by RT PCR NEGATIVE NEGATIVE Final    Comment: (NOTE) SARS-CoV-2 target nucleic acids are NOT DETECTED.  The SARS-CoV-2 RNA is generally detectable in upper respiratoy specimens during the acute phase of infection. The lowest concentration of SARS-CoV-2 viral copies this assay can detect is 131 copies/mL. A negative result does not preclude SARS-Cov-2 infection and should not be used as the sole basis for treatment or other patient management decisions.  A negative result may occur with  improper specimen collection/handling, submission of specimen other than nasopharyngeal swab, presence of viral mutation(s) within the areas targeted by this assay, and inadequate number of viral copies (<131 copies/mL). A negative result must be combined with clinical observations, patient history, and epidemiological information. The expected result is Negative.  Fact Sheet for Patients:  https://www.moore.com/  Fact Sheet for Healthcare Providers:  https://www.young.biz/  This test is no t yet approved or cleared by the [place] FDA and  has been authorized for detection and/or diagnosis of SARS-CoV-2 by FDA under an Emergency Use Authorization (EUA). This EUA will remain  in effect (meaning this test can be used) for the duration of the COVID-19 declaration under Section 564(b)(1) of the Act, 21 U.S.C. section 360bbb-3(b)(1), unless the authorization is terminated or revoked sooner.     Influenza A by PCR NEGATIVE NEGATIVE Final   Influenza B by PCR NEGATIVE NEGATIVE Final    Comment: (NOTE) The Xpert Xpress SARS-CoV-2/FLU/RSV assay is intended as an aid in  the diagnosis of influenza from Nasopharyngeal swab specimens and  should not be used as a sole basis for treatment. Nasal washings and  aspirates are unacceptable for Xpert Xpress SARS-CoV-2/FLU/RSV  testing.  Fact Sheet for  Patients: https://www.moore.com/  Fact Sheet for Healthcare Providers: https://www.young.biz/  This test is not yet approved or cleared by the [place] FDA and  has been authorized for detection and/or diagnosis of SARS-CoV-2 by  FDA under an Emergency Use Authorization (EUA). This EUA will remain  in effect (meaning this test can be used) for the duration of the  Covid-19 declaration under Section 564(b)(1) of the Act, 21  U.S.C. section 360bbb-3(b)(1), unless the authorization is  terminated or revoked. Performed at [facility], [address] [place], [place] [address]   Blood Cultures x 2 sites     Status: None (Preliminary result)   Collection Time: 01/15/20  9:39 AM   Specimen: BLOOD LEFT ARM  Result Value Ref Range Status   Specimen Description BLOOD LEFT ARM  Final   Special Requests   Final    BOTTLES DRAWN AEROBIC AND ANAEROBIC Blood Culture adequate volume   Culture   Final    NO GROWTH 4 DAYS Performed at [facility], [address] [place], [place] [address]    Report Status PENDING  Incomplete  Blood Cultures x 2 sites     Status: None (Preliminary result)   Collection Time: 01/15/20  9:48 AM   Specimen: BLOOD RIGHT ARM  Result Value Ref Range Status   Specimen Description BLOOD RIGHT ARM  Final   Special Requests   Final    BOTTLES DRAWN AEROBIC AND ANAEROBIC Blood Culture adequate volume   Culture   Final    NO GROWTH 4 DAYS Performed at [facility], [address], [place], [place] [address]    Report Status PENDING  Incomplete     Labs: Basic Metabolic Panel: Recent Labs  Lab 01/15/20 0200  NA 137  K 3.6  CL 97*  CO2 30  GLUCOSE 124*  BUN 12  CREATININE 0.62  CALCIUM 8.4*   Liver Function Tests: No results for input(s): AST, ALT, ALKPHOS, BILITOT, PROT, ALBUMIN in the last 168 hours. No results for input(s): LIPASE, AMYLASE in the last 168 hours. No results for input(s):  AMMONIA in the last 168 hours. CBC: Recent Labs  Lab 01/15/20 0200  WBC 9.8  NEUTROABS 5.6  HGB 14.2  HCT 40.5  MCV 97.8  PLT 217   Cardiac Enzymes: No results for input(s): CKTOTAL, CKMB, CKMBINDEX, TROPONINI in the last 168 hours. BNP: BNP (last 3 results) No results for input(s): BNP in the last 8760 hours.  ProBNP (last 3 results) No results for input(s): PROBNP in the last 8760 hours.  CBG: Recent Labs  Lab 01/15/20 0145  GLUCAP 119*       Signed:  Jonah Blue, MD [facility] 01/19/2020, 6:41 PM

## 2020-01-20 LAB — CULTURE, BLOOD (ROUTINE X 2)
Culture: NO GROWTH
Culture: NO GROWTH
Special Requests: ADEQUATE
Special Requests: ADEQUATE

## 2020-03-05 ENCOUNTER — Other Ambulatory Visit: Payer: Self-pay

## 2020-03-05 ENCOUNTER — Emergency Department (HOSPITAL_COMMUNITY): Payer: Medicaid Other

## 2020-03-05 ENCOUNTER — Emergency Department (HOSPITAL_COMMUNITY)
Admission: EM | Admit: 2020-03-05 | Discharge: 2020-03-05 | Disposition: A | Discharge status: Home / Self Care | Payer: Medicaid Other | Attending: Emergency Medicine | Admitting: Emergency Medicine

## 2020-03-05 DIAGNOSIS — R112 Nausea with vomiting, unspecified: Secondary | ICD-10-CM | POA: Diagnosis not present

## 2020-03-05 DIAGNOSIS — F329 Major depressive disorder, single episode, unspecified: Secondary | ICD-10-CM | POA: Diagnosis not present

## 2020-03-05 DIAGNOSIS — E119 Type 2 diabetes mellitus without complications: Secondary | ICD-10-CM | POA: Insufficient documentation

## 2020-03-05 DIAGNOSIS — M79671 Pain in right foot: Secondary | ICD-10-CM | POA: Diagnosis not present

## 2020-03-05 DIAGNOSIS — Z5321 Procedure and treatment not carried out due to patient leaving prior to being seen by health care provider: Secondary | ICD-10-CM | POA: Insufficient documentation

## 2020-03-05 DIAGNOSIS — F419 Anxiety disorder, unspecified: Secondary | ICD-10-CM | POA: Diagnosis not present

## 2020-03-05 LAB — CBC
HCT: 41.4 % (ref 39.0–52.0)
Hemoglobin: 14.4 g/dL (ref 13.0–17.0)
MCH: 34.3 pg — ABNORMAL HIGH (ref 26.0–34.0)
MCHC: 34.8 g/dL (ref 30.0–36.0)
MCV: 98.6 fL (ref 80.0–100.0)
Platelets: 247 10*3/uL (ref 150–400)
RBC: 4.2 MIL/uL — ABNORMAL LOW (ref 4.22–5.81)
RDW: 11.5 % (ref 11.5–15.5)
WBC: 7.6 10*3/uL (ref 4.0–10.5)
nRBC: 0 % (ref 0.0–0.2)

## 2020-03-05 LAB — COMPREHENSIVE METABOLIC PANEL
ALT: 14 U/L (ref 0–44)
AST: 16 U/L (ref 15–41)
Albumin: 3.1 g/dL — ABNORMAL LOW (ref 3.5–5.0)
Alkaline Phosphatase: 71 U/L (ref 38–126)
Anion gap: 12 (ref 5–15)
BUN: 7 mg/dL (ref 6–20)
CO2: 25 mmol/L (ref 22–32)
Calcium: 8.8 mg/dL — ABNORMAL LOW (ref 8.9–10.3)
Chloride: 100 mmol/L (ref 98–111)
Creatinine, Ser: 0.78 mg/dL (ref 0.61–1.24)
GFR, Estimated: >60 mL/min (ref >60)
Glucose, Bld: 233 mg/dL — ABNORMAL HIGH (ref 70–99)
Potassium: 3.7 mmol/L (ref 3.5–5.1)
Sodium: 137 mmol/L (ref 135–145)
Total Bilirubin: 0.6 mg/dL (ref 0.3–1.2)
Total Protein: 6.7 g/dL (ref 6.5–8.1)

## 2020-03-05 LAB — LIPASE, BLOOD: Lipase: 20 U/L (ref 11–51)

## 2020-03-05 NOTE — ED Notes (Signed)
Pt called 4 x no reponse

## 2020-03-05 NOTE — ED Triage Notes (Signed)
Pt reports swelling and pain in R foot from where his boot has rubbed a raw spot. Hx of diabetes. Swelling and redness on same leg is extending up calf. Tried to make an appointment with his podiatrist but she has moved to [place]. Also reports n/v x 3 days. Hx gastric sleeve surgery. Also reports anxiety and depression d/t current health concerns.

## 2020-06-16 ENCOUNTER — Other Ambulatory Visit: Payer: Self-pay

## 2020-06-16 ENCOUNTER — Encounter (HOSPITAL_COMMUNITY): Payer: Self-pay | Admitting: Emergency Medicine

## 2020-06-16 ENCOUNTER — Ambulatory Visit (HOSPITAL_COMMUNITY)
Admission: EM | Admit: 2020-06-16 | Discharge: 2020-06-17 | Disposition: A | Discharge status: Home / Self Care | Payer: Medicaid Other | Attending: Urology | Admitting: Urology

## 2020-06-16 DIAGNOSIS — Z9151 Personal history of suicidal behavior: Secondary | ICD-10-CM | POA: Diagnosis not present

## 2020-06-16 DIAGNOSIS — F333 Major depressive disorder, recurrent, severe with psychotic symptoms: Secondary | ICD-10-CM | POA: Diagnosis not present

## 2020-06-16 DIAGNOSIS — R45851 Suicidal ideations: Secondary | ICD-10-CM | POA: Insufficient documentation

## 2020-06-16 DIAGNOSIS — Z20822 Contact with and (suspected) exposure to covid-19: Secondary | ICD-10-CM | POA: Diagnosis not present

## 2020-06-16 DIAGNOSIS — Z8639 Personal history of other endocrine, nutritional and metabolic disease: Secondary | ICD-10-CM | POA: Insufficient documentation

## 2020-06-16 DIAGNOSIS — F331 Major depressive disorder, recurrent, moderate: Secondary | ICD-10-CM

## 2020-06-16 DIAGNOSIS — Z885 Allergy status to narcotic agent status: Secondary | ICD-10-CM | POA: Insufficient documentation

## 2020-06-16 DIAGNOSIS — Z888 Allergy status to other drugs, medicaments and biological substances status: Secondary | ICD-10-CM | POA: Insufficient documentation

## 2020-06-16 DIAGNOSIS — F17210 Nicotine dependence, cigarettes, uncomplicated: Secondary | ICD-10-CM | POA: Insufficient documentation

## 2020-06-16 DIAGNOSIS — F1121 Opioid dependence, in remission: Secondary | ICD-10-CM | POA: Insufficient documentation

## 2020-06-16 LAB — POCT URINE DRUG SCREEN - MANUAL ENTRY (I-SCREEN)
POC Amphetamine UR: NOT DETECTED
POC Buprenorphine (BUP): POSITIVE — AB
POC Cocaine UR: NOT DETECTED
POC Marijuana UR: POSITIVE — AB
POC Methadone UR: NOT DETECTED
POC Methamphetamine UR: NOT DETECTED
POC Morphine: NOT DETECTED
POC Oxazepam (BZO): NOT DETECTED
POC Oxycodone UR: NOT DETECTED
POC Secobarbital (BAR): NOT DETECTED

## 2020-06-16 LAB — POC SARS CORONAVIRUS 2 AG -  ED: SARS Coronavirus 2 Ag: NEGATIVE

## 2020-06-16 LAB — POC SARS CORONAVIRUS 2 AG: SARS Coronavirus 2 Ag: NEGATIVE

## 2020-06-16 MED ORDER — HYDROXYZINE HCL 25 MG PO TABS: 25.0000 mg | ORAL_TABLET | Freq: Three times a day (TID) | ORAL | Status: DC | PRN

## 2020-06-16 MED ORDER — MAGNESIUM HYDROXIDE 400 MG/5ML PO SUSP: 30.0000 mL | Freq: Every day | ORAL | Status: DC | PRN

## 2020-06-16 MED ORDER — ALUM & MAG HYDROXIDE-SIMETH 200-200-20 MG/5ML PO SUSP: 30.0000 mL | ORAL | Status: DC | PRN

## 2020-06-16 MED ORDER — SERTRALINE HCL 25 MG PO TABS
25.0000 mg | ORAL_TABLET | Freq: Every day | ORAL | Status: DC
  Filled 2020-06-16: qty 1

## 2020-06-16 MED ORDER — ACETAMINOPHEN 325 MG PO TABS: 650.0000 mg | ORAL_TABLET | Freq: Four times a day (QID) | ORAL | Status: DC | PRN

## 2020-06-16 MED ORDER — TRAZODONE HCL 50 MG PO TABS: 50.0000 mg | ORAL_TABLET | Freq: Every evening | ORAL | Status: DC | PRN

## 2020-06-16 NOTE — ED Provider Notes (Incomplete)
Behavioral Health Admission H&P [facility] & OBS)  Date: 06/17/20 Patient Name: Brian Day MRN: [id number] Chief Complaint:  Chief Complaint  Patient presents with  . Suicidal   Chief Complaint/Presenting Problem: Brian Day is a 40yo male reporting as a wwalk in to [facility] for assessment of depression/suicidal ideation. Pt reports that he has suicidal ideation with no concrete plan or intent to follow through. Pt denies any HI or AVH.  Pt states that sometimes he does feel paranoid (people talking about him, staring at him, plotting against him). Pt denies that he is under the treatment of a psychiatrist or counselor at time of assessment.Pt states that he was put on Latuda in the past and had a significantly negative reaction (violent).  Pt admits that he has tried to attempt suicide by taking an overdose of pills in the past. Pt denies any self-harm behaviors. Pt states that he is smoking marijuana and taking MAT pills that he would like to stop.  Pts appearance is normal, and motor activity is agitated. Pt has poor insight and speech was slurred and soft. Pts affect was anxious and depressed/flat. Pt has poor judgment with intact memory. Pt feels that he is a danger to himself at time of assessment.  Diagnoses:  Final diagnoses:  MDD (major depressive disorder), recurrent, severe, with psychosis (HCC)    HPI: Brian Day is a 39y/o male. Patient presented voluntarily to [facility] with chief complaint of worsening depression, anxiety, and suicidal thoughts. Patient evaluated face to face by this Clinical research associate. Patient is aler and orient X4,  Patient reports that is he is on Zubsolv (buprenorphine and naloxone) due to history of opiate abuse. He reports that he has been trying to wean himself off of Zubsolv for ~2 weeks. He stated that coming off of Zubsolv has worsened his depression and anxiety; and has made him suicidal. He reports that his depressive symptoms include hopelessness, worthlessness, poor appetite,  lack of sleep, tiredness, and isolation. He reports that he went back to taking Zubsolv 2 tablets daily yesterday but continues to have suicidal ideations. He reports that he has suicidal ideations with plan to overdose on fentanyl; patient "stated I know how to get enough fentanyl to kill myself but I want to get help so I don't do it."  Patient is unable to contract for safety. He continues to endorse suicidal ideation with plan to overdose on fentanyl. He reports that his family is worried about him and he is unsure if he can maintain safety at home. He denies HI, AVH and there are no indications of delusions. He endorses paranoia and states "I feel like something is always watching me and people talking about me." He endorses daily marijuana use; he denies alcohol and other illicit drug use. 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 He reports that he has being cleaned off opiates for about 3years however review of PDMP shows that he had a 5 days prescription for oxycodone-acetaminophen 10-325mg  in 02/2020.   PHQ 2-9:  Flowsheet Row Office Visit from 02/16/2017 in [facility] Office Visit from 01/23/2017 in [facility] Office Visit from 11/12/2016 in [facility]  Thoughts that you would be better off dead, or of hurting yourself in some way Not at all Not at all Several days  PHQ-9 Total Score Flowsheet Row ED from 06/16/2020 in [facility]  C-SSRS RISK CATEGORY High Risk       Total Time spent with patient: 30 minutes  Musculoskeletal  Strength & Muscle Tone: within normal limits Gait & Station: normal Patient leans: Right  Psychiatric Specialty Exam  Presentation General Appearance: Appropriate for Environment  Eye Contact:Minimal  Speech:Clear and Coherent  Speech Volume:Normal  Handedness:Right   Mood and Affect   Mood:Depressed  Affect:Constricted   Thought Process  Thought Processes:Coherent  Descriptions of Associations:Intact  Orientation:Full (Time, Place and Person)  Thought Content:WDL    Hallucinations:Hallucinations: None  Ideas of Reference:None  Suicidal Thoughts:Suicidal Thoughts: Yes, Active SI Active Intent and/or Plan: With Plan  Homicidal Thoughts:Homicidal Thoughts: No   Sensorium  Memory:Immediate Good; Remote Good  Judgment:Fair  Insight:No data recorded  Executive Functions  Concentration:Good  Attention Span:Good  Recall:Good  Fund of Knowledge:No data recorded Language:Good   Psychomotor Activity  Psychomotor Activity:Psychomotor Activity: Normal   Assets  Assets:Communication Skills; Desire for Improvement; Financial Resources/Insurance; Housing; Social Support; Talents/Skills; Transportation; Vocational/Educational   Sleep  Sleep:Sleep: Poor Number of Hours of Sleep: 3   Nutritional Assessment (For OBS and FBC admissions only) Has the patient had a weight loss or gain of 10 pounds or more in the last 3 months?: No Has the patient had a decrease in food intake/or appetite?: Yes Does the patient have dental problems?: No Does the patient have eating habits or behaviors that may be indicators of an eating disorder including binging or inducing vomiting?: No Has the patient recently lost weight without trying?: No Has the patient been eating poorly because of a decreased appetite?: No Malnutrition Screening Tool Score: 0    Physical Exam Constitutional:      General: He is not in acute distress.    Appearance: He is obese. He is not toxic-appearing.  HENT:     Head: Normocephalic.  Eyes:     General:        Right eye: No discharge.        Left eye: No discharge.  Cardiovascular:     Rate and Rhythm: Normal rate.  Pulmonary:     Effort: Pulmonary effort is normal. No respiratory distress.  Musculoskeletal:        General:  Swelling: swelling to RLE.     Right lower leg: Edema present.  Skin:    General: Skin is dry.     Coloration: Skin is not jaundiced or pale.  Neurological:     Mental Status: He is alert and oriented to person, place, and time.  Psychiatric:        Attention and Perception: Attention normal. He perceives auditory hallucinations. He does not perceive visual hallucinations.        Mood and Affect: Mood is depressed.        Speech: He is communicative.        Behavior: Behavior is not agitated or aggressive.        Thought Content: Thought content is paranoid. Thought content is not delusional. Thought content includes suicidal ideation. Thought content does not include homicidal ideation. Thought content includes suicidal plan. Thought content does not include homicidal plan.        Cognition and Memory: Cognition normal.        Judgment: Judgment normal.    Review of Systems  Constitutional: Negative.   HENT: Negative.  Negative for ear discharge and ear pain.   Eyes: Negative.   Respiratory: Negative.  Negative for cough, hemoptysis and shortness of breath.  Cardiovascular: Negative for chest pain and palpitations.  Gastrointestinal: Negative.   Genitourinary: Negative.   Musculoskeletal: Negative.   Skin: Negative.   Neurological: Negative.   Endo/Heme/Allergies: Negative.   Psychiatric/Behavioral: Positive for depression, substance abuse and suicidal ideas. Negative for hallucinations. The patient is nervous/anxious and has insomnia.     Blood pressure 126/87, pulse 93, temperature 97.7 F (36.5 C), temperature source Oral, resp. rate 18, SpO2 97 %. There is no height or weight on file to calculate BMI.  Past Psychiatric History: MDD, suicidal attempt, anxiety   Is the patient at risk to self? Yes  Has the patient been a risk to self in the past 6 months? No .    Has the patient been a risk to self within the distant past? Yes   Is the patient a risk to others? No   Has  the patient been a risk to others in the past 6 months? No   Has the patient been a risk to others within the distant past? No   Past Medical History:  Past Medical History:  Diagnosis Date  . Addiction, opium (HCC)   . Anxiety   . Arthritis    "right knee" (07/15/2016)  . CAP (community acquired pneumonia) 05/2014   hx/notes 06/07/2014  . Depression   . GERD (gastroesophageal reflux disease)   . Hypertension   . Migraine    "once q 3-4 years" (07/15/2016)  . Morbid obesity (HCC)   . Type II diabetes mellitus (HCC)     Past Surgical History:  Procedure Laterality Date  . KNEE ARTHROSCOPY Right 2000s  . LAPAROSCOPIC GASTRIC SLEEVE RESECTION  11/2018    Family History:  Family History  Problem Relation Age of Onset  . Diabetes Mother   . Hypertension Mother   . Hypertension Father     Social History:  Social History   Socioeconomic History  . Marital status: Single    Spouse name: Not on file  . Number of children: Not on file  . Years of education: Not on file  . Highest education level: Not on file  Occupational History  . Occupation: courier  Tobacco Use  . Smoking status: Current Every Day Smoker    Packs/day: 1.00    Years: 24.00    Pack years: 24.00    Types: Cigarettes  . Smokeless tobacco: Former Neurosurgeon    Types: Snuff, Chew  Vaping Use  . Vaping Use: Never used  Substance and Sexual Activity  . Alcohol use: No  . Drug use: Not Currently    Types: Marijuana    Comment: smokes regularly  . Sexual activity: Never  Other Topics Concern  . Not on file  Social History Narrative  . Not on file   Social Determinants of Health   Financial Resource Strain: Not on file  Food Insecurity: Not on file  Transportation Needs: Not on file  Physical Activity: Not on file  Stress: Not on file  Social Connections: Not on file  Intimate Partner Violence: Not on file    SDOH:  SDOH Screenings   Alcohol Screen: Not on file  Depression (WUJ8-1): Not on file   Financial Resource Strain: Not on file  Food Insecurity: Not on file  Housing: Not on file  Physical Activity: Not on file  Social Connections: Not on file  Stress: Not on file  Tobacco Use: High Risk  . Smoking Tobacco Use: Current Every Day Smoker  . Smokeless Tobacco Use: Former Dispensing optician  Needs: Not on file    Last Labs:  Admission on 06/16/2020  Component Date Value Ref Range Status  . SARS Coronavirus 2 Ag 06/16/2020 Negative  Negative Preliminary  . POC Amphetamine UR 06/16/2020 None Detected  NONE DETECTED (Cut Off Level 1000 ng/mL) Final  . POC Secobarbital (BAR) 06/16/2020 None Detected  NONE DETECTED (Cut Off Level 300 ng/mL) Final  . POC Buprenorphine (BUP) 06/16/2020 Positive* NONE DETECTED (Cut Off Level 10 ng/mL) Final  . POC Oxazepam (BZO) 06/16/2020 None Detected  NONE DETECTED (Cut Off Level 300 ng/mL) Final  . POC Cocaine UR 06/16/2020 None Detected  NONE DETECTED (Cut Off Level 300 ng/mL) Final  . POC Methamphetamine UR 06/16/2020 None Detected  NONE DETECTED (Cut Off Level 1000 ng/mL) Final  . POC Morphine 06/16/2020 None Detected  NONE DETECTED (Cut Off Level 300 ng/mL) Final  . POC Oxycodone UR 06/16/2020 None Detected  NONE DETECTED (Cut Off Level 100 ng/mL) Final  . POC Methadone UR 06/16/2020 None Detected  NONE DETECTED (Cut Off Level 300 ng/mL) Final  . POC Marijuana UR 06/16/2020 Positive* NONE DETECTED (Cut Off Level 50 ng/mL) Final  . SARS Coronavirus 2 Ag 06/16/2020 NEGATIVE  NEGATIVE Final   Comment: (NOTE) SARS-CoV-2 antigen NOT DETECTED.   Negative results are presumptive.  Negative results do not preclude SARS-CoV-2 infection and should not be used as the sole basis for treatment or other patient management decisions, including infection  control decisions, particularly in the presence of clinical signs and  symptoms consistent with COVID-19, or in those who have been in contact with the virus.  Negative results must be combined  with clinical observations, patient history, and epidemiological information. The expected result is Negative.  Fact Sheet for Patients: https://www.jennings-kim.com/  Fact Sheet for Healthcare Providers: https://alexander-rogers.biz/  This test is not yet approved or cleared by the [place] FDA and  has been authorized for detection and/or diagnosis of SARS-CoV-2 by FDA under an Emergency Use Authorization (EUA).  This EUA will remain in effect (meaning this test can be used) for the duration of  the COV                          ID-19 declaration under Section 564(b)(1) of the Act, 21 U.S.C. section 360bbb-3(b)(1), unless the authorization is terminated or revoked sooner.    Admission on 03/05/2020, Discharged on 03/05/2020  Component Date Value Ref Range Status  . Lipase 03/05/2020 20  11 - 51 U/L Final   Performed at [facility], [address] [place], [place] [address]  . Sodium 03/05/2020 137  135 - 145 mmol/L Final  . Potassium 03/05/2020 3.7  3.5 - 5.1 mmol/L Final  . Chloride 03/05/2020 100  98 - 111 mmol/L Final  . CO2 03/05/2020 25  22 - 32 mmol/L Final  . Glucose, Bld 03/05/2020 233* 70 - 99 mg/dL Final   Glucose reference range applies only to samples taken after fasting for at least 8 hours.  . BUN 03/05/2020 7  6 - 20 mg/dL Final  . Creatinine, Ser 03/05/2020 0.78  0.61 - 1.24 mg/dL Final  . Calcium 60/45/4098 8.8* 8.9 - 10.3 mg/dL Final  . Total Protein 03/05/2020 6.7  6.5 - 8.1 g/dL Final  . Albumin 11/91/4782 3.1* 3.5 - 5.0 g/dL Final  . AST 95/62/1308 16  15 - 41 U/L Final  . ALT 03/05/2020 14  0 - 44 U/L Final  . Alkaline Phosphatase  03/05/2020 71  38 - 126 U/L Final  . Total Bilirubin 03/05/2020 0.6  0.3 - 1.2 mg/dL Final  . GFR, Estimated 03/05/2020 >60  >60 mL/min Final   Comment: (NOTE) Calculated using the CKD-EPI Creatinine Equation (2021)   . Anion gap 03/05/2020 12  5 - 15 Final   Performed at [facility], [address], [place], [place] [address]  . WBC 03/05/2020 7.6  4.0 - 10.5 K/uL Final  . RBC 03/05/2020 4.20* 4.22 - 5.81 MIL/uL Final  . Hemoglobin 03/05/2020 14.4  13.0 - 17.0 g/dL Final  . HCT 38/45/3646 41.4  39.0 - 52.0 % Final  . MCV 03/05/2020 98.6  80.0 - 100.0 fL Final  . MCH 03/05/2020 34.3* 26.0 - 34.0 pg Final  . MCHC 03/05/2020 34.8  30.0 - 36.0 g/dL Final  . RDW 80/32/1224 11.5  11.5 - 15.5 % Final  . Platelets 03/05/2020 247  150 - 400 K/uL Final  . nRBC 03/05/2020 0.0  0.0 - 0.2 % Final   Performed at [facility], [address], [place], [place] [address]  Admission on 01/14/2020, Discharged on 01/15/2020  Component Date Value Ref Range Status  . WBC 01/15/2020 9.8  4.0 - 10.5 K/uL Final  . RBC 01/15/2020 4.14* 4.22 - 5.81 MIL/uL Final  . Hemoglobin 01/15/2020 14.2  13.0 - 17.0 g/dL Final  . HCT 37/06/8887 40.5  39.0 - 52.0 % Final  . MCV 01/15/2020 97.8  80.0 - 100.0 fL Final  . MCH 01/15/2020 34.3* 26.0 - 34.0 pg Final  . MCHC 01/15/2020 35.1  30.0 - 36.0 g/dL Final  . RDW 16/94/5038 11.7  11.5 - 15.5 % Final  . Platelets 01/15/2020 217  150 - 400 K/uL Final  . nRBC 01/15/2020 0.0  0.0 - 0.2 % Final  . Neutrophils Relative % 01/15/2020 58  % Final  . Neutro Abs 01/15/2020 5.6  1.7 - 7.7 K/uL Final  . Lymphocytes Relative 01/15/2020 30  % Final  . Lymphs Abs 01/15/2020 3.0  0.7 - 4.0 K/uL Final  . Monocytes Relative 01/15/2020 10  % Final  . Monocytes Absolute 01/15/2020 1.0  0.1 - 1.0 K/uL Final  . Eosinophils Relative 01/15/2020 2  % Final  . Eosinophils Absolute 01/15/2020 0.2  0.0 - 0.5 K/uL Final  . Basophils Relative 01/15/2020 0  % Final  . Basophils Absolute 01/15/2020 0.0  0.0 - 0.1 K/uL Final  . Immature Granulocytes 01/15/2020 0  % Final  . Abs Immature Granulocytes 01/15/2020 0.04  0.00 - 0.07 K/uL Final   Performed at [facility], [address] [place], [place] [address]  . Sodium 01/15/2020 137  135 - 145 mmol/L  Final  . Potassium 01/15/2020 3.6  3.5 - 5.1 mmol/L Final  . Chloride 01/15/2020 97* 98 - 111 mmol/L Final  . CO2 01/15/2020 30  22 - 32 mmol/L Final  . Glucose, Bld 01/15/2020 124* 70 - 99 mg/dL Final   Glucose reference range applies only to samples taken after fasting for at least 8 hours.  . BUN 01/15/2020 12  6 - 20 mg/dL Final  . Creatinine, Ser 01/15/2020 0.62  0.61 - 1.24 mg/dL Final  . Calcium 03/49/1791 8.4* 8.9 - 10.3 mg/dL Final  . GFR, Estimated 01/15/2020 >60  >60 mL/min Final   Comment: (NOTE) Calculated using the CKD-EPI Creatinine Equation (2021)   . Anion gap 01/15/2020 10  5 - 15 Final   Performed at  [facility], [address] [place], [place] [address]  . D-Dimer, Quant 01/15/2020 0.49  0.00 - 0.50 ug/mL-FEU Final   Comment: (NOTE) At the manufacturer cut-off value of 0.5 g/mL FEU, this assay has a negative predictive value of 95-100%.This assay is intended for use in conjunction with a clinical pretest probability (PTP) assessment model to exclude pulmonary embolism (PE) and deep venous thrombosis (DVT) in outpatients suspected of PE or DVT. Results should be correlated with clinical presentation. Performed at [facility], [address] [place], [place] [address]   . Sed Rate 01/15/2020 49* 0 - 16 mm/hr Final   Performed at [facility], [address] [place], [place] [address]  . CRP 01/15/2020 4.8* <1.0 mg/dL Final   Performed at [facility], [address] [place], [place] [address]  . Specimen Description 01/15/2020    Final                   Value:TOE RIGHT Performed at [facility], [address] [place], [place] [address]   . Special Requests 01/15/2020    Final                   Value:NONE Performed at [facility], [address] [place], [place] [address]   . Gram Stain 01/15/2020    Final                   Value:RARE WBC PRESENT, PREDOMINANTLY PMN FEW GRAM POSITIVE COCCI  IN CLUSTERS Performed at [facility], [address], [place], [place] [address]   . Culture 01/15/2020 FEW STAPHYLOCOCCUS AUREUS   Final  . Report Status 01/15/2020 01/17/2020 FINAL   Final  . Organism ID, Bacteria 01/15/2020 STAPHYLOCOCCUS AUREUS   Final  . Glucose-Capillary 01/15/2020 119* 70 - 99 mg/dL Final   Glucose reference range applies only to samples taken after fasting for at least 8 hours.  Marland Kitchen SARS Coronavirus 2 by RT PCR 01/15/2020 NEGATIVE  NEGATIVE Final   Comment: (NOTE) SARS-CoV-2 target nucleic acids are NOT DETECTED.  The SARS-CoV-2 RNA is generally detectable in upper respiratoy specimens during the acute phase of infection. The lowest concentration of SARS-CoV-2 viral copies this assay can detect is 131 copies/mL. A negative result does not preclude SARS-Cov-2 infection and should not be used as the sole basis for treatment or other patient management decisions. A negative result may occur with  improper specimen collection/handling, submission of specimen other than nasopharyngeal swab, presence of viral mutation(s) within the areas targeted by this assay, and inadequate number of viral copies (<131 copies/mL). A negative result must be combined with clinical observations, patient history, and epidemiological information. The expected result is Negative.  Fact Sheet for Patients:  https://www.moore.com/  Fact Sheet for Healthcare Providers:  https://www.young.biz/  This test is no                          t yet approved or cleared by the [place] FDA and  has been authorized for detection and/or diagnosis of SARS-CoV-2 by FDA under an Emergency Use Authorization (EUA). This EUA will remain  in effect (meaning this test can be used) for the duration of the COVID-19 declaration under Section 564(b)(1) of the Act, 21 U.S.C. section 360bbb-3(b)(1), unless the authorization is terminated or revoked sooner.     . Influenza  A by PCR 01/15/2020 NEGATIVE  NEGATIVE Final  . Influenza B by PCR 01/15/2020 NEGATIVE  NEGATIVE Final   Comment: (NOTE) The Xpert Xpress SARS-CoV-2/FLU/RSV assay is intended as an aid in  the diagnosis of influenza from Nasopharyngeal swab specimens and  should not be used as a sole basis for treatment. Nasal washings and  aspirates are unacceptable for Xpert Xpress SARS-CoV-2/FLU/RSV  testing.  Fact Sheet for Patients: https://www.moore.com/https://www.fda.gov/media/142436/download  Fact Sheet for Healthcare Providers: https://www.young.biz/https://www.fda.gov/media/142435/download  This test is not yet approved or cleared by the [place] FDA and  has been authorized for detection and/or diagnosis of SARS-CoV-2 by  FDA under an Emergency Use Authorization (EUA). This EUA will remain  in effect (meaning this test can be used) for the duration of the  Covid-19 declaration under Section 564(b)(1) of the Act, 21  U.S.C. section 360bbb-3(b)(1), unless the authorization is  terminated or revoked. Performed at [facility], [address] [place], [place] [address]   . Hgb A1c MFr Bld 01/15/2020 5.7* 4.8 - 5.6 % Final   Comment: (NOTE)         Prediabetes: 5.7 - 6.4         Diabetes: >6.4         Glycemic control for adults with diabetes: <7.0   . Mean Plasma Glucose 01/15/2020 117  mg/dL Final   Comment: (NOTE) Performed At: [facility] [address] [place], [place] [id number] Jolene SchimkeNagendra Sanjai MD XB:[id number]:[phone]   . Prealbumin 01/15/2020 10.4* 18 - 38 mg/dL Final   Performed at [facility], [address] [place], [place] [address]  . Specimen Description 01/15/2020 BLOOD RIGHT ARM   Final  . Special Requests 01/15/2020 BOTTLES DRAWN AEROBIC AND ANAEROBIC Blood Culture adequate volume   Final  . Culture 01/15/2020    Final                   Value:NO GROWTH 5 DAYS Performed at [facility], [address] [place], [place] [address]   . Report Status 01/15/2020  01/20/2020 FINAL   Final  . Specimen Description 01/15/2020 BLOOD LEFT ARM   Final  . Special Requests 01/15/2020 BOTTLES DRAWN AEROBIC AND ANAEROBIC Blood Culture adequate volume   Final  . Culture 01/15/2020    Final                   Value:NO GROWTH 5 DAYS Performed at [facility], [address] [place], [place] [address]   . Report Status 01/15/2020 01/20/2020 FINAL   Final  . HIV Screen 4th Generation wRfx 01/15/2020 Non Reactive  Non Reactive Final   Performed at [facility], [address] [place], [place] [address]    Allergies: Colchicine, Lisinopril, Morphine, and Oxycodone hcl  PTA Medications: (Not in a hospital admission)   Medical Decision Making  [facility] for [facility] assessment and stabilization for safety with psychiatry reassessment tomorrow -start lexapro 10mg  for depression -hydroxyzine 25mg  for anxiety -check CBG BID-hx of diabetes (pt reports that he was taken off all diabetic meds) -labs     Recommendations  Based on my evaluation the patient does not appear to have an emergency medical condition.  Maricela BoEne A Ajibola, NP 06/17/20  12:01 AM

## 2020-06-16 NOTE — ED Notes (Signed)
PT A&O x 4, presents with suicidal ideations, plan to overdose on pills.  Denies HI or AVH. Pt calm & cooperative.  Monitoring for safety, no distress noted.

## 2020-06-16 NOTE — BH Assessment (Signed)
Brian Day urgent MR [id number]: 40 year old presents this date with unaccompanied.  Pt reports that he is having suicide thoughts; also depression for one week.  Pt reports that his plan is to overdose on pills.  Pt reports that he is currently receiving Medication Assistance Treatment and have stop taking his medication. Pt denies HI and AVH.

## 2020-06-16 NOTE — ED Provider Notes (Addendum)
Behavioral Health Admission [facility](FBC & OBS)  Date: 06/17/20 Patient Name: Brian Day MRN: [id number] Chief Complaint:  Chief Complaint  Patient presents with  . Suicidal   Chief Complaint/Presenting Problem: Brian RuizJohn is a 40yo male reporting as a wwalk [facility] assessment of depression/suicidal ideation. Pt reports that he has suicidal ideation with no concrete plan or intent to follow through. Pt denies any HI or AVH.  Pt states that sometimes he does feel paranoid (people talking about him, staring at him, plotting against him). Pt denies that he is under the treatment of a psychiatrist or counselor at time of assessment.Pt states that he was put on Latuda in the past and had a significantly negative reaction (violent).  Pt admits that he has tried to attempt suicide by taking an overdose of pills in the past. Pt denies any self-harm behaviors. Pt states that he is smoking marijuana and taking MAT pills that he would like to stop.  Pts appearance is normal, and motor activity is agitated. Pt has poor insight and speech was slurred and soft. Pts affect was anxious and depressed/flat. Pt has poor judgment with intact memory. Pt feels that he is a danger to himself at time of assessment.  Diagnoses:  Final diagnoses:  MDD (major depressive disorder), recurrent, severe, with psychosis (HCC)    HPI: Brian BeetsJohn Day is a 39y/o male. [facility] with chief complaint of worsening depression, anxiety, and suicidal thoughts. Patient evaluated face to face by this Clinical research associatewriter. Patient is aler and orient X4, he mood/affect is depressed, though process is coherent, speech in of normal volume, rate and tone.    Patient reports that is he is on Zubsolv (buprenorphine and naloxone) due to history of opiate abuse. He reports that he has been trying to wean himself off of Zubsolv for ~2 weeks. He stated that coming off of Zubsolv has worsened his depression and anxiety; and has made him  suicidal. He reports that his depressive symptoms include hopelessness, worthlessness, poor appetite, lack of sleep, tiredness, and isolation. He reports that he went back to taking Zubsolv 2 tablets daily yesterday but continues to have suicidal ideations. He reports that he has suicidal ideations with plan to overdose on fentanyl; patient "stated I know how to get enough fentanyl to kill myself but I want to get help so I don't do it."  Patient is unable to contract for safety. He continues to endorse suicidal ideation with plan to overdose on fentanyl. He reports that his family is worried about him and he is unsure if he can maintain safety at home. He denies HI, AVH and there are no indications of delusions. He endorses paranoia and states "I feel like something is always watching me and people talking about me." He endorses daily marijuana use; he denies alcohol and other illicit drug use. 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 He reports that he has being cleaned off opiates for about 3years however review of PDMP shows that he had a 5 days prescription for oxycodone-acetaminophen 10-325mg  in 02/2020.   PHQ 2-9:  Flowsheet Row [facility] Office [facility]  Thoughts that you would be better off dead, or of hurting yourself in some way Not at all Not at all Several days  PHQ-9 Total  Score 22 22 19       Flowsheet Row ED from 06/16/2020 in [facility]  C-SSRS RISK CATEGORY High Risk       Total Time spent with patient: 30 minutes  Musculoskeletal  Strength & Muscle Tone: within normal limits Gait & Station: normal Patient leans: Right  Psychiatric Specialty Exam  Presentation General Appearance: Appropriate for Environment  Eye  Contact:Minimal  Speech:Clear and Coherent  Speech Volume:Normal  Handedness:Right   Mood and Affect  Mood:Depressed  Affect:Constricted   Thought Process  Thought Processes:Coherent  Descriptions of Associations:Intact  Orientation:Full (Time, Place and Person)  Thought Content:WDL    Hallucinations:Hallucinations: None  Ideas of Reference:None  Suicidal Thoughts:Suicidal Thoughts: Yes, Active SI Active Intent and/or Plan: With Plan  Homicidal Thoughts:Homicidal Thoughts: No   Sensorium  Memory:Immediate Good; Remote Good  Judgment:Fair  Insight:No data recorded  Executive Functions  Concentration:Good  Attention Span:Good  Recall:Good  Fund of Knowledge:No data recorded Language:Good   Psychomotor Activity  Psychomotor Activity:Psychomotor Activity: Normal   Assets  Assets:Communication Skills; Desire for Improvement; Financial Resources/Insurance; Housing; Social Support; Talents/Skills; Transportation; Vocational/Educational   Sleep  Sleep:Sleep: Poor Number of Hours of Sleep: 3   Nutritional Assessment (For OBS and FBC admissions only) Has the patient had a weight loss or gain of 10 pounds or more in the last 3 months?: No Has the patient had a decrease in food intake/or appetite?: Yes Does the patient have dental problems?: No Does the patient have eating habits or behaviors that may be indicators of an eating disorder including binging or inducing vomiting?: No Has the patient recently lost weight without trying?: No Has the patient been eating poorly because of a decreased appetite?: No Malnutrition Screening Tool Score: 0    Physical Exam Constitutional:      General: He is not in acute distress.    Appearance: He is obese. He is not toxic-appearing.  HENT:     Head: Normocephalic.  Eyes:     General:        Right eye: No discharge.        Left eye: No discharge.  Cardiovascular:     Rate and Rhythm: Normal rate.   Pulmonary:     Effort: Pulmonary effort is normal. No respiratory distress.  Musculoskeletal:        General: Swelling: swelling to RLE.     Right lower leg: Edema present.  Skin:    General: Skin is dry.     Coloration: Skin is not jaundiced or pale.  Neurological:     Mental Status: He is alert and oriented to person, place, and time.  Psychiatric:        Attention and Perception: Attention normal. He perceives auditory hallucinations. He does not perceive visual hallucinations.        Mood and Affect: Mood is depressed.        Speech: He is communicative.        Behavior: Behavior is not agitated or aggressive.        Thought Content: Thought content is paranoid. Thought content is not delusional. Thought content includes suicidal ideation. Thought content does not include homicidal ideation. Thought content includes suicidal plan. Thought content does not include homicidal plan.        Cognition and Memory: Cognition normal.        Judgment: Judgment normal.    Review of Systems  Constitutional: Negative.   HENT: Negative.  Negative for ear discharge and ear  pain.   Eyes: Negative.   Respiratory: Negative.  Negative for cough, hemoptysis and shortness of breath.   Cardiovascular: Negative for chest pain and palpitations.  Gastrointestinal: Negative.   Genitourinary: Negative.   Musculoskeletal: Negative.   Skin: Negative.   Neurological: Negative.   Endo/Heme/Allergies: Negative.   Psychiatric/Behavioral: Positive for depression, substance abuse and suicidal ideas. Negative for hallucinations. The patient is nervous/anxious and has insomnia.     Blood pressure 126/87, pulse 93, temperature 97.7 F (36.5 C), temperature source Oral, resp. rate 18, SpO2 97 %. There is no height or weight on file to calculate BMI.  Past Psychiatric History: MDD, suicidal attempt, anxiety   Is the patient at risk to self? Yes  Has the patient been a risk to self in the past 6 months? No .     Has the patient been a risk to self within the distant past? Yes   Is the patient a risk to others? No   Has the patient been a risk to others in the past 6 months? No   Has the patient been a risk to others within the distant past? No   Past Medical History:  Past Medical History:  Diagnosis Date  . Addiction, opium (HCC)   . Anxiety   . Arthritis    "right knee" (07/15/2016)  . CAP (community acquired pneumonia) 05/2014   hx/notes 06/07/2014  . Depression   . GERD (gastroesophageal reflux disease)   . Hypertension   . Migraine    "once q 3-4 years" (07/15/2016)  . Morbid obesity (HCC)   . Type II diabetes mellitus (HCC)     Past Surgical History:  Procedure Laterality Date  . KNEE ARTHROSCOPY Right 2000s  . LAPAROSCOPIC GASTRIC SLEEVE RESECTION  11/2018    Family History:  Family History  Problem Relation Age of Onset  . Diabetes Mother   . Hypertension Mother   . Hypertension Father     Social History:  Social History   Socioeconomic History  . Marital status: Single    Spouse name: Not on file  . Number of children: Not on file  . Years of education: Not on file  . Highest education level: Not on file  Occupational History  . Occupation: courier  Tobacco Use  . Smoking status: Current Every Day Smoker    Packs/day: 1.00    Years: 24.00    Pack years: 24.00    Types: Cigarettes  . Smokeless tobacco: Former Neurosurgeon    Types: Snuff, Chew  Vaping Use  . Vaping Use: Never used  Substance and Sexual Activity  . Alcohol use: No  . Drug use: Not Currently    Types: Marijuana    Comment: smokes regularly  . Sexual activity: Never  Other Topics Concern  . Not on file  Social History Narrative  . Not on file   Social Determinants of Health   Financial Resource Strain: Not on file  Food Insecurity: Not on file  Transportation Needs: Not on file  Physical Activity: Not on file  Stress: Not on file  Social Connections: Not on file  Intimate Partner  Violence: Not on file    SDOH:  SDOH Screenings   Alcohol Screen: Not on file  Depression (ZOX0-9): Not on file  Financial Resource Strain: Not on file  Food Insecurity: Not on file  Housing: Not on file  Physical Activity: Not on file  Social Connections: Not on file  Stress: Not on file  Tobacco Use:  High Risk  . Smoking Tobacco Use: Current Every Day Smoker  . Smokeless Tobacco Use: Former Dispensing optician Needs: Not on file    Last Labs:  Admission on 06/16/2020  Component Date Value Ref Range Status  . SARS Coronavirus 2 Ag 06/16/2020 Negative  Negative Preliminary  . POC Amphetamine UR 06/16/2020 None Detected  NONE DETECTED (Cut Off Level 1000 ng/mL) Final  . POC Secobarbital (BAR) 06/16/2020 None Detected  NONE DETECTED (Cut Off Level 300 ng/mL) Final  . POC Buprenorphine (BUP) 06/16/2020 Positive* NONE DETECTED (Cut Off Level 10 ng/mL) Final  . POC Oxazepam (BZO) 06/16/2020 None Detected  NONE DETECTED (Cut Off Level 300 ng/mL) Final  . POC Cocaine UR 06/16/2020 None Detected  NONE DETECTED (Cut Off Level 300 ng/mL) Final  . POC Methamphetamine UR 06/16/2020 None Detected  NONE DETECTED (Cut Off Level 1000 ng/mL) Final  . POC Morphine 06/16/2020 None Detected  NONE DETECTED (Cut Off Level 300 ng/mL) Final  . POC Oxycodone UR 06/16/2020 None Detected  NONE DETECTED (Cut Off Level 100 ng/mL) Final  . POC Methadone UR 06/16/2020 None Detected  NONE DETECTED (Cut Off Level 300 ng/mL) Final  . POC Marijuana UR 06/16/2020 Positive* NONE DETECTED (Cut Off Level 50 ng/mL) Final  . SARS Coronavirus 2 Ag 06/16/2020 NEGATIVE  NEGATIVE Final   Comment: (NOTE) SARS-CoV-2 antigen NOT DETECTED.   Negative results are presumptive.  Negative results do not preclude SARS-CoV-2 infection and should not be used as the sole basis for treatment or other patient management decisions, including infection  control decisions, particularly in the presence of clinical signs and  symptoms  consistent with COVID-19, or in those who have been in contact with the virus.  Negative results must be combined with clinical observations, patient history, and epidemiological information. The expected result is Negative.  Fact Sheet for Patients: https://www.jennings-kim.com/  Fact Sheet for Healthcare Providers: https://alexander-rogers.biz/  This test is not yet approved or cleared by the [place] FDA and  has been authorized for detection and/or diagnosis of SARS-CoV-2 by FDA under an Emergency Use Authorization (EUA).  This EUA will remain in effect (meaning this test can be used) for the duration of  the COV                          ID-19 declaration under Section 564(b)(1) of the Act, 21 U.S.C. section 360bbb-3(b)(1), unless the authorization is terminated or revoked sooner.    Admission on 03/05/2020, Discharged on 03/05/2020  Component Date Value Ref Range Status  . Lipase 03/05/2020 20  11 - 51 U/L Final   Performed at [facility], [address] [place], [place] [address]  . Sodium 03/05/2020 137  135 - 145 mmol/L Final  . Potassium 03/05/2020 3.7  3.5 - 5.1 mmol/L Final  . Chloride 03/05/2020 100  98 - 111 mmol/L Final  . CO2 03/05/2020 25  22 - 32 mmol/L Final  . Glucose, Bld 03/05/2020 233* 70 - 99 mg/dL Final   Glucose reference range applies only to samples taken after fasting for at least 8 hours.  . BUN 03/05/2020 7  6 - 20 mg/dL Final  . Creatinine, Ser 03/05/2020 0.78  0.61 - 1.24 mg/dL Final  . Calcium 60/45/4098 8.8* 8.9 - 10.3 mg/dL Final  . Total Protein 03/05/2020 6.7  6.5 - 8.1 g/dL Final  . Albumin 11/91/4782 3.1* 3.5 - 5.0 g/dL Final  . AST 95/62/1308 16  15 - 41 U/L Final  . ALT 03/05/2020 14  0 - 44 U/L Final  . Alkaline Phosphatase 03/05/2020 71  38 - 126 U/L Final  . Total Bilirubin 03/05/2020 0.6  0.3 - 1.2 mg/dL Final  . GFR, Estimated 03/05/2020 >60  >60 mL/min Final   Comment: (NOTE) Calculated using  the CKD-EPI Creatinine Equation (2021)   . Anion gap 03/05/2020 12  5 - 15 Final   Performed at [facility], [address], [place], [place] [address]  . WBC 03/05/2020 7.6  4.0 - 10.5 K/uL Final  . RBC 03/05/2020 4.20* 4.22 - 5.81 MIL/uL Final  . Hemoglobin 03/05/2020 14.4  13.0 - 17.0 g/dL Final  . HCT 17/79/3903 41.4  39.0 - 52.0 % Final  . MCV 03/05/2020 98.6  80.0 - 100.0 fL Final  . MCH 03/05/2020 34.3* 26.0 - 34.0 pg Final  . MCHC 03/05/2020 34.8  30.0 - 36.0 g/dL Final  . RDW 00/92/3300 11.5  11.5 - 15.5 % Final  . Platelets 03/05/2020 247  150 - 400 K/uL Final  . nRBC 03/05/2020 0.0  0.0 - 0.2 % Final   Performed at [facility], [address], [place], [place] [address]  Admission on 01/14/2020, Discharged on 01/15/2020  Component Date Value Ref Range Status  . WBC 01/15/2020 9.8  4.0 - 10.5 K/uL Final  . RBC 01/15/2020 4.14* 4.22 - 5.81 MIL/uL Final  . Hemoglobin 01/15/2020 14.2  13.0 - 17.0 g/dL Final  . HCT 33/35/4562 40.5  39.0 - 52.0 % Final  . MCV 01/15/2020 97.8  80.0 - 100.0 fL Final  . MCH 01/15/2020 34.3* 26.0 - 34.0 pg Final  . MCHC 01/15/2020 35.1  30.0 - 36.0 g/dL Final  . RDW 56/38/9373 11.7  11.5 - 15.5 % Final  . Platelets 01/15/2020 217  150 - 400 K/uL Final  . nRBC 01/15/2020 0.0  0.0 - 0.2 % Final  . Neutrophils Relative % 01/15/2020 58  % Final  . Neutro Abs 01/15/2020 5.6  1.7 - 7.7 K/uL Final  . Lymphocytes Relative 01/15/2020 30  % Final  . Lymphs Abs 01/15/2020 3.0  0.7 - 4.0 K/uL Final  . Monocytes Relative 01/15/2020 10  % Final  . Monocytes Absolute 01/15/2020 1.0  0.1 - 1.0 K/uL Final  . Eosinophils Relative 01/15/2020 2  % Final  . Eosinophils Absolute 01/15/2020 0.2  0.0 - 0.5 K/uL Final  . Basophils Relative 01/15/2020 0  % Final  . Basophils Absolute 01/15/2020 0.0  0.0 - 0.1 K/uL Final  . Immature Granulocytes 01/15/2020 0  % Final  . Abs Immature Granulocytes 01/15/2020 0.04  0.00 - 0.07 K/uL Final   Performed at [facility], [address] [place], [place] [address]  . Sodium 01/15/2020 137  135 - 145 mmol/L Final  . Potassium 01/15/2020 3.6  3.5 - 5.1 mmol/L Final  . Chloride 01/15/2020 97* 98 - 111 mmol/L Final  . CO2 01/15/2020 30  22 - 32 mmol/L Final  . Glucose, Bld 01/15/2020 124* 70 - 99 mg/dL Final   Glucose reference range applies only to samples taken after fasting for at least 8 hours.  . BUN 01/15/2020 12  6 - 20 mg/dL Final  . Creatinine, Ser 01/15/2020 0.62  0.61 - 1.24 mg/dL Final  . Calcium 81/15/7262 8.4* 8.9 - 10.3 mg/dL Final  . GFR, Estimated 01/15/2020 >60  >60 mL/min Final   Comment: (NOTE) Calculated using the  CKD-EPI Creatinine Equation (2021)   . Anion gap 01/15/2020 10  5 - 15 Final   Performed at [facility], [address] [place], [place] [address]  . D-Dimer, Quant 01/15/2020 0.49  0.00 - 0.50 ug/mL-FEU Final   Comment: (NOTE) At the manufacturer cut-off value of 0.5 g/mL FEU, this assay has a negative predictive value of 95-100%.This assay is intended for use in conjunction with a clinical pretest probability (PTP) assessment model to exclude pulmonary embolism (PE) and deep venous thrombosis (DVT) in outpatients suspected of PE or DVT. Results should be correlated with clinical presentation. Performed at [facility], [address] [place], [place] [address]   . Sed Rate 01/15/2020 49* 0 - 16 mm/hr Final   Performed at [facility], [address] [place], [place] [address]  . CRP 01/15/2020 4.8* <1.0 mg/dL Final   Performed at [facility], [address] [place], [place] [address]  . Specimen Description 01/15/2020    Final                   Value:TOE RIGHT Performed at [facility], [address] [place], [place] [address]   . Special Requests 01/15/2020    Final                   Value:NONE Performed at [facility], [address] [place], [place] [address]   . Gram  Stain 01/15/2020    Final                   Value:RARE WBC PRESENT, PREDOMINANTLY PMN FEW GRAM POSITIVE COCCI IN CLUSTERS Performed at [facility], [address] [place], [place] [address]   . Culture 01/15/2020 FEW STAPHYLOCOCCUS AUREUS   Final  . Report Status 01/15/2020 01/17/2020 FINAL   Final  . Organism ID, Bacteria 01/15/2020 STAPHYLOCOCCUS AUREUS   Final  . Glucose-Capillary 01/15/2020 119* 70 - 99 mg/dL Final   Glucose reference range applies only to samples taken after fasting for at least 8 hours.  Mar[place] Kitchen SARS Coronavirus 2 by RT PCR 01/15/2020 NEGATIVE  NEGATIVE Final   Comment: (NOTE) SARS-CoV-2 target nucleic acids are NOT DETECTED.  The SARS-CoV-2 RNA is generally detectable in upper respiratoy specimens during the acute phase of infection. The lowest concentration of SARS-CoV-2 viral copies this assay can detect is 131 copies/mL. A negative result does not preclude SARS-Cov-2 infection and should not be used as the sole basis for treatment or other patient management decisions. A negative result may occur with  improper specimen collection/handling, submission of specimen other than nasopharyngeal swab, presence of viral mutation(s) within the areas targeted by this assay, and inadequate number of viral copies (<131 copies/mL). A negative result must be combined with clinical observations, patient history, and epidemiological information. The expected result is Negative.  Fact Sheet for Patients:  https://www.moore.com/  Fact Sheet for Healthcare Providers:  https://www.young.biz/  This test is no                          t yet approved or cleared by the [place] FDA and  has been authorized for detection and/or diagnosis of SARS-CoV-2 by FDA under an Emergency Use Authorization (EUA). This EUA will remain  in effect (meaning this test can be used) for the duration of the COVID-19 declaration under Section 564(b)(1)  of the Act, 21 U.S.C. section 360bbb-3(b)(1), unless the authorization is terminated or revoked sooner.    . Influenza A by PCR 01/15/2020 NEGATIVE  NEGATIVE Final  . Influenza B by PCR 01/15/2020 NEGATIVE  NEGATIVE Final   Comment: (NOTE) The Xpert Xpress SARS-CoV-2/FLU/RSV assay is intended as an aid in  the diagnosis of influenza from Nasopharyngeal swab specimens and  should not be used as a sole basis for treatment. Nasal washings and  aspirates are unacceptable for Xpert Xpress SARS-CoV-2/FLU/RSV  testing.  Fact Sheet for Patients: https://www.moore.com/  Fact Sheet for Healthcare Providers: https://www.young.biz/  This test is not yet approved or cleared by the [place] FDA and  has been authorized for detection and/or diagnosis of SARS-CoV-2 by  FDA under an Emergency Use Authorization (EUA). This EUA will remain  in effect (meaning this test can be used) for the duration of the  Covid-19 declaration under Section 564(b)(1) of the Act, 21  U.S.C. section 360bbb-3(b)(1), unless the authorization is  terminated or revoked. Performed at [facility], [address] [place], [place] [address]   . Hgb A1c MFr Bld 01/15/2020 5.7* 4.8 - 5.6 % Final   Comment: (NOTE)         Prediabetes: 5.7 - 6.4         Diabetes: >6.4         Glycemic control for adults with diabetes: <7.0   . Mean Plasma Glucose 01/15/2020 117  mg/dL Final   Comment: (NOTE) Performed At: [facility] [address] [place], [place] [id number] Jolene Schimke MD ZM:[id number]   . Prealbumin 01/15/2020 10.4* 18 - 38 mg/dL Final   Performed at [facility], [address] [place], [place] [address]  . Specimen Description 01/15/2020 BLOOD RIGHT ARM   Final  . Special Requests 01/15/2020 BOTTLES DRAWN AEROBIC AND ANAEROBIC Blood Culture adequate volume   Final  . Culture 01/15/2020    Final                   Value:NO GROWTH 5  DAYS Performed at [facility], [address] [place], [place] [address]   . Report Status 01/15/2020 01/20/2020 FINAL   Final  . Specimen Description 01/15/2020 BLOOD LEFT ARM   Final  . Special Requests 01/15/2020 BOTTLES DRAWN AEROBIC AND ANAEROBIC Blood Culture adequate volume   Final  . Culture 01/15/2020    Final                   Value:NO GROWTH 5 DAYS Performed at [facility], [address], [place], [place] [address]   . Report Status 01/15/2020 01/20/2020 FINAL   Final  . HIV Screen 4th Generation wRfx 01/15/2020 Non Reactive  Non Reactive Final   Performed at [facility], [address] [place], [place] [address]    Allergies: Colchicine, Lisinopril, Morphine, and Oxycodone hcl  PTA Medications: (Not in a hospital admission)   Medical Decision Making  Admit patient to [facility] for continuous assessment and stabilization for safety with psychiatry reassessment tomorrow -start lexapro 10mg  for depression -hydroxyzine 25mg  for anxiety -check CBG Qam--hx of diabetes (pt reports that he was taken off all diabetic meds after weight loss) -labs     Recommendations  Based on my evaluation the patient does not appear to have an emergency medical condition.  , NP 06/17/20  12:01 AM

## 2020-06-16 NOTE — ED Notes (Signed)
Pt sleeping at present, refused night time meds, no distress noted.  Monitoring for safety.

## 2020-06-16 NOTE — BH Assessment (Signed)
Comprehensive Clinical Assessment (CCA) Note  06/16/2020 Brian Day [id number]  Chief Complaint:  Chief Complaint  Patient presents with  . Suicidal   Visit Diagnosis:  Major depressive disorder  Disposition: Per Cecilio Asper, NP Pt meets criteria for [facility] overnight observation with provider reassessment in the AM.   Flowsheet Row ED from 06/16/2020 in [facility]  C-SSRS RISK CATEGORY High Risk     The patient demonstrates the following risk factors for suicide: Chronic risk factors for suicide include: psychiatric disorder of depression, substance use disorder and previous suicide attempts x 1. Acute risk factors for suicide include: social withdrawal/isolation and loss (financial, interpersonal, professional). Protective factors for this patient include: hope for the future. Considering these factors, the overall suicide risk at this point appears to be moderate. Patient is not appropriate for outpatient follow up.    Brian Day is a 40yo male reporting as a wwalk in to [facility] for assessment of depression/suicidal ideation. Pt reports that he has suicidal ideation with no concrete plan or intent to follow through. Pt denies any HI or AVH. Pt states that sometimes he does feel paranoid (people talking about him, staring at him, plotting against him). Pt denies that he is under the treatment of a psychiatrist or counselor at time of assessment.Pt states that he was put on Latuda in the past and had a significantly negative reaction (violent). Pt admits that he has tried to attempt suicide by taking an overdose of pills in the past. Pt denies any self-harm behaviors. Pt states that he is smoking marijuana and taking MAT pills that he would like to stop. Pts appearance is normal, and motor activity is agitated. Pt has poor insight and speech was slurred and soft. Pts affect was anxious and depressed/flat. Pt has poor judgment with intact memory. Pt feels that he is a  danger to himself at time of assessment.  Brian Day, MSW, LCSW Outpatient Therapist/Triage Specialist    CCA Screening, Triage and Referral (STR)  Patient Reported Information How did you hear about [place]? Self  Referral name: self  Referral phone number: No data recorded  Whom do you see for routine medical problems? No data recorded Practice/Facility Name: No data recorded Practice/Facility Phone Number: No data recorded Name of Contact: No data recorded Contact Number: No data recorded Contact Fax Number: No data recorded Prescriber Name: No data recorded Prescriber Address (if known): No data recorded  What Is the Reason for Your Visit/Call Today? Depression  How Long Has This Been Causing You Problems? <Week  What Do You Feel Would Help You the Most Today? Alcohol or Drug Use Treatment; Treatment for Depression or other mood problem   Have You Recently Been in Any Inpatient Treatment (Hospital/Detox/Crisis Center/28-Day Program)? No  Name/Location of Program/Hospital:No data recorded How Long Were You There? No data recorded When Were You Discharged? No data recorded  Have You Ever Received Services From [facility] Before? Yes  Who Do You See at [facility]? No data recorded  Have You Recently Had Any Thoughts About Hurting Yourself? Yes  Are You Planning to Commit Suicide/Harm Yourself At This time? No   Have you Recently Had Thoughts About Hurting Someone Karolee Ohs? No  Explanation: No data recorded  Have You Used Any Alcohol or Drugs in the Past 24 Hours? Yes (Pt reports medication assistance treatment)  How Long Ago Did You Use Drugs or Alcohol? No data recorded What Did You Use and How Much? marijuana   Do  You Currently Have a Therapist/Psychiatrist? No  Name of Therapist/Psychiatrist: No data recorded  Have You Been Recently Discharged From Any Office Practice or Programs? No  Explanation of Discharge From Practice/Program: No data  recorded    CCA Screening Triage Referral Assessment Type of Contact: Face-to-Face  Is this Initial or Reassessment? No data recorded Date Telepsych consult ordered in [facility]:  No data recorded Time Telepsych consult ordered in [facility]:  No data recorded  Patient Reported Information Reviewed? Yes  Patient Left Without Being Seen? No data recorded Reason for Not Completing Assessment: No data recorded  Collateral Involvement: none   Does Patient Have a Court Appointed Legal Guardian? No data recorded Name and Contact of Legal Guardian: No data recorded If Minor and Not Living with Parent(s), Who has Custody? No data recorded Is CPS involved or ever been involved? Never  Is APS involved or ever been involved? Never   Patient Determined To Be At Risk for Harm To Self or Others Based on Review of Patient Reported Information or Presenting Complaint? Yes, for Self-Harm  Method: No data recorded Availability of Means: No data recorded Intent: No data recorded Notification Required: No data recorded Additional Information for Danger to Others Potential: No data recorded Additional Comments for Danger to Others Potential: No data recorded Are There Guns or Other Weapons in Your Home? No data recorded Types of Guns/Weapons: No data recorded Are These Weapons Safely Secured?                            No data recorded Who Could Verify You Are Able To Have These Secured: No data recorded Do You Have any Outstanding Charges, Pending Court Dates, Parole/Probation? No data recorded Contacted To Inform of Risk of Harm To Self or Others: No data recorded  Location of Assessment: [facility] Services   Does Patient Present under Involuntary Commitment? No  IVC Papers Initial File Date: No data recorded  [place] of Residence: [place]   Patient Currently Receiving the Following Services: Not Receiving Services   Determination of Need: Urgent (48 hours)   Options For Referral:  Chemical Dependency Intensive Outpatient Therapy (CDIOP); [facility] (Overnight observation with provider reassessment in AM)     CCA Biopsychosocial Intake/Chief Complaint:  Brian Day is a 40yo male reporting as a wwalk in to [facility] for assessment of depression/suicidal ideation. Pt reports that he has suicidal ideation with no concrete plan or intent to follow through. Pt denies any HI or AVH.  Pt states that sometimes he does feel paranoid (people talking about him, staring at him, plotting against him). Pt denies that he is under the treatment of a psychiatrist or counselor at time of assessment.Pt states that he was put on Latuda in the past and had a significantly negative reaction (violent).  Pt admits that he has tried to attempt suicide by taking an overdose of pills in the past. Pt denies any self-harm behaviors. Pt states that he is smoking marijuana and taking MAT pills that he would like to stop.  Pts appearance is normal, and motor activity is agitated. Pt has poor insight and speech was slurred and soft. Pts affect was anxious and depressed/flat. Pt has poor judgment with intact memory. Pt feels that he is a danger to himself at time of assessment.  Current Symptoms/Problems: depression, SI   Patient Reported Schizophrenia/Schizoaffective Diagnosis in Past: No   Strengths: No data recorded Preferences: No data recorded  Abilities: good self awareness   Type of Services Patient Feels are Needed: psychiatric stabilization   Initial Clinical Notes/Concerns: No data recorded  Mental Health Symptoms Depression:  Hopelessness; Sleep (too much or little); Tearfulness; Worthlessness; Difficulty Concentrating; Change in energy/activity; Fatigue; Increase/decrease in appetite; Irritability   Duration of Depressive symptoms: No data recorded  Mania:  None   Anxiety:   Worrying; Restlessness; Irritability; Fatigue; Difficulty concentrating   Psychosis:  None   Duration of Psychotic  symptoms: No data recorded  Trauma:  No data recorded  Obsessions:  None   Compulsions:  None   Inattention:  None   Hyperactivity/Impulsivity:  N/A   Oppositional/Defiant Behaviors:  No data recorded  Emotional Irregularity:  Mood lability   Other Mood/Personality Symptoms:  No data recorded   Mental Status Exam Appearance and self-care  Stature:  Average   Weight:  Overweight   Clothing:  Neat/clean   Grooming:  Normal   Cosmetic use:  None   Posture/gait:  Slumped; Tense   Motor activity:  Agitated; Restless   Sensorium  Attention:  Normal   Concentration:  Anxiety interferes   Orientation:  X5   Recall/memory:  Normal   Affect and Mood  Affect:  Anxious; Depressed   Mood:  Angry; Depressed   Relating  Eye contact:  None   Facial expression:  Anxious; Depressed   Attitude toward examiner:  Cooperative   Thought and Language  Speech flow: Slurred; Soft   Thought content:  Appropriate to Mood and Circumstances   Preoccupation:  None   Hallucinations:  None   Organization:  No data recorded  [employer] of Knowledge:  Fair   Intelligence:  Average   Abstraction:  Functional   Judgement:  Impaired   Reality Testing:  Variable   Insight:  Gaps   Decision Making:  Impulsive   Social Functioning  Social Maturity:  Impulsive   Social Judgement:  No data recorded  Stress  Stressors:  Family conflict; Grief/losses   Coping Ability:  Exhausted; Overwhelmed   Skill Deficits:  Self-control   Supports:  No data recorded    Religion:    Leisure/Recreation: Leisure / Recreation Do You Have Hobbies?: Yes Leisure and Hobbies: fishing, camping; outdoor activities  Exercise/Diet: Exercise/Diet Do You Exercise?: Yes What Type of Exercise Do You Do?: Run/Walk How Many Times a Week Do You Exercise?: 4-5 times a week Do You Follow a Special Diet?: No Do You Have Any Trouble Sleeping?: Yes Explanation of Sleeping  Difficulties: insomnia--only 3-4 hours of sleep per evening   CCA Employment/Education Employment/Work Situation: Employment / Work Situation Employment situation: Employed 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 has been impacted by current illness: No Has patient ever been in the [employer]?: No  Education: Education Did Garment/textile technologist From [employer]?: Yes Did Theme park manager?: No Did Designer, television/film set?: No Did You Have Any Difficulty At [employer]?: No Patient's Education Has Been Impacted by Current Illness: No   CCA Family/Childhood History Family and Relationship History:    Childhood History:  Childhood History By whom was/is the patient raised?: Grandparents Additional childhood history information: stable childhood Description of patient's relationship with caregiver when they were a child: stable childhood Patient's description of current relationship with people who raised him/her: stable Does patient have siblings?: No Did patient suffer any verbal/emotional/physical/sexual abuse as a child?: No Did patient suffer from severe childhood neglect?: No Has patient ever been sexually abused/assaulted/raped  as an adolescent or adult?: No Was the patient ever a victim of a crime or a disaster?: No Witnessed domestic violence?: No Has patient been affected by domestic violence as an adult?: No  Child/Adolescent Assessment:     CCA Substance Use Alcohol/Drug Use: Alcohol / Drug Use Pain Medications: see MAR Prescriptions: see MAR Over the Counter: see MAR History of alcohol / drug use?: Yes Substance #1 Name of Substance 1: cannabis 1 - Age of First Use: unknown 1 - Amount (size/oz): variable 1 - Frequency: variable 1 - Duration: variable 1 - Last Use / Amount: unknown 1 - Method of Aquiring: street 1- Route of Use: oral/smoke Substance #2 Name of Substance 2: MAT medication 2 - Age of First Use: unknown 2 - Amount  (size/oz): unknown 2 - Frequency: daily 2 - Duration: unknown 2 - Last Use / Amount: today 2 - Method of Aquiring: MD 2 - Route of Substance Use: oral/pills     ASAM's:  Six Dimensions of Multidimensional Assessment  Dimension 1:  Acute Intoxication and/or Withdrawal Potential:   Dimension 1:  Description of individual's past and current experiences of substance use and withdrawal: none  Dimension 2:  Biomedical Conditions and Complications:      Dimension 3:  Emotional, Behavioral, or Cognitive Conditions and Complications:     Dimension 4:  Readiness to Change:     Dimension 5:  Relapse, Continued use, or Continued Problem Potential:     Dimension 6:  Recovery/Living Environment:     ASAM Severity Score: ASAM's Severity Rating Score: 5  ASAM Recommended Level of Treatment:     Substance use Disorder (SUD)    Recommendations for Services/Supports/Treatments: Recommendations for Services/Supports/Treatments Recommendations For Services/Supports/Treatments: Other (Comment) (overnight observation [facility] with provider reassessment in the AM)  DSM5 Diagnoses: Patient Active Problem List   Diagnosis Date Noted  . Diabetic foot infection (HCC) 01/15/2020  . Opiate dependence, continuous (HCC) 01/15/2020  . ADHD (attention deficit hyperactivity disorder) 01/15/2020  . Upper respiratory infection 01/23/2017  . 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 MDD (major depressive disorder), recurrent episode, moderate (HCC) 08/19/2016  . Idiopathic chronic gout of right ankle without tophus   . Chronic pain in right foot 07/15/2016  . Diabetic peripheral neuropathy (HCC) 12/29/2013  . Benign essential hypertension 10/27/2013  . Uncontrolled type 2 diabetes mellitus with complication, with long-term current use of insulin (HCC) 11/23/2012  . Morbid obesity (HCC) 11/23/2012  . Low back pain 11/23/2012    Referrals to Alternative Service(s): Referred to Alternative Service(s):   Place:   Date:   Time:    Referred to Alternative Service(s):   Place:   Date:   Time:    Referred to Alternative Service(s):   Place:   Date:   Time:    Referred to Alternative Service(s):   Place:   Date:   Time:     Ernest HaberChristina R Lakeena Downie, LCSW

## 2020-06-17 LAB — LIPID PANEL
Cholesterol: 188 mg/dL (ref 0–200)
HDL: 37 mg/dL — ABNORMAL LOW (ref >40)
LDL Cholesterol: 97 mg/dL (ref 0–99)
Total CHOL/HDL Ratio: 5.1 RATIO
Triglycerides: 270 mg/dL — ABNORMAL HIGH (ref <150)
VLDL: 54 mg/dL — ABNORMAL HIGH (ref 0–40)

## 2020-06-17 LAB — CBC WITH DIFFERENTIAL/PLATELET
Abs Immature Granulocytes: 0.02 10*3/uL (ref 0.00–0.07)
Basophils Absolute: 0 10*3/uL (ref 0.0–0.1)
Basophils Relative: 0 %
Eosinophils Absolute: 0.2 10*3/uL (ref 0.0–0.5)
Eosinophils Relative: 2 %
HCT: 45 % (ref 39.0–52.0)
Hemoglobin: 16.1 g/dL (ref 13.0–17.0)
Immature Granulocytes: 0 %
Lymphocytes Relative: 47 %
Lymphs Abs: 4.3 10*3/uL — ABNORMAL HIGH (ref 0.7–4.0)
MCH: 34 pg (ref 26.0–34.0)
MCHC: 35.8 g/dL (ref 30.0–36.0)
MCV: 95.1 fL (ref 80.0–100.0)
Monocytes Absolute: 0.8 10*3/uL (ref 0.1–1.0)
Monocytes Relative: 9 %
Neutro Abs: 3.9 10*3/uL (ref 1.7–7.7)
Neutrophils Relative %: 42 %
Platelets: 215 10*3/uL (ref 150–400)
RBC: 4.73 MIL/uL (ref 4.22–5.81)
RDW: 12 % (ref 11.5–15.5)
WBC: 9.3 10*3/uL (ref 4.0–10.5)
nRBC: 0 % (ref 0.0–0.2)

## 2020-06-17 LAB — COMPREHENSIVE METABOLIC PANEL
ALT: 13 U/L (ref 0–44)
AST: 14 U/L — ABNORMAL LOW (ref 15–41)
Albumin: 4 g/dL (ref 3.5–5.0)
Alkaline Phosphatase: 60 U/L (ref 38–126)
Anion gap: 7 (ref 5–15)
BUN: 13 mg/dL (ref 6–20)
CO2: 29 mmol/L (ref 22–32)
Calcium: 8.9 mg/dL (ref 8.9–10.3)
Chloride: 101 mmol/L (ref 98–111)
Creatinine, Ser: 0.71 mg/dL (ref 0.61–1.24)
GFR, Estimated: >60 mL/min (ref >60)
Glucose, Bld: 161 mg/dL — ABNORMAL HIGH (ref 70–99)
Potassium: 3.6 mmol/L (ref 3.5–5.1)
Sodium: 137 mmol/L (ref 135–145)
Total Bilirubin: 0.4 mg/dL (ref 0.3–1.2)
Total Protein: 7.1 g/dL (ref 6.5–8.1)

## 2020-06-17 LAB — RESP PANEL BY RT-PCR (FLU A&B, COVID) ARPGX2
Influenza A by PCR: NEGATIVE
Influenza B by PCR: NEGATIVE
SARS Coronavirus 2 by RT PCR: NEGATIVE

## 2020-06-17 LAB — HEMOGLOBIN A1C
Hgb A1c MFr Bld: 5.7 % — ABNORMAL HIGH (ref 4.8–5.6)
Mean Plasma Glucose: 116.89 mg/dL

## 2020-06-17 LAB — TSH: TSH: 2.003 u[IU]/mL (ref 0.350–4.500)

## 2020-06-17 MED ORDER — HYDROXYZINE HCL 25 MG PO TABS
50.0000 mg | ORAL_TABLET | Freq: Three times a day (TID) | ORAL | Status: DC | PRN
  Administered 2020-06-17: 25 mg via ORAL
  Filled 2020-06-17: qty 1
  Filled 2020-06-17: qty 2

## 2020-06-17 MED ORDER — SERTRALINE HCL 25 MG PO TABS: 25.0000 mg | ORAL_TABLET | Freq: Every day | ORAL | 0 refills | Status: DC

## 2020-06-17 NOTE — ED Notes (Signed)
Pt refused to take zoloft at this time. Was advised by sponsor to ask for paxil. Said he wanted to talk to provider before taking any meds

## 2020-06-17 NOTE — ED Notes (Signed)
Resting with eyes closed. Rise and fall of chest noted. No new issues noted at this time. Will continue to monitor for safety 

## 2020-06-17 NOTE — ED Notes (Signed)
Lunch given.

## 2020-06-17 NOTE — BH Assessment (Signed)
Received a call back from [facility] stating that they are at capacity. Patient will not be considered at this facility today.

## 2020-06-17 NOTE — ED Notes (Signed)
Pt sleeping at present, no distress noted, monitoring for safety. 

## 2020-06-17 NOTE — ED Notes (Signed)
Pt alert and oriented on the unit. Education, support, and encouragement provided. Discharge summary, medications and follow up appointments reviewed with pt. Suicide prevention resources provided. Pt's belongings in locker returned. Pt denies SI/HI, A/VH, pain, or any concerns at this time. Pt ambulatory on and off unit. Pt discharged to lobby. 

## 2020-06-17 NOTE — ED Notes (Signed)
Shoes gave back to pt per provider order d/t missing great toe, swelling, and diabetic foot issues

## 2020-06-17 NOTE — ED Provider Notes (Addendum)
Behavioral Health Progress Note  Date and Time: 06/17/2020 11:14 AM Name: Brian Day MRN:  [id number]  Subjective:  Patient reassessed by nurse practitioner. Brian Day is tearful, reports increasing depression x 5 days. He endorses suicidal ideations, denies plan or intent at this time. He is unable to contract for safety at this time. Brian Day reports one prior suicide attempt by intentional overdose in 2015.  Triggers include "my life is shit right now, I work all the time, 12-16 hours per day, I have no money to pay bills and I am about to lose my truck." He also reports he has recently, with the assistance of his outpatient provider, began weaning from opoid medication assistance medication, Zubsolv.  He reports increasing depression when not administered Zubsolv. Brian Day would like to stop using Zubsolv as he believes this medication does not align with NA teachings.  Brian Day has been sober x 2 months. He reports relapse on marijuana yesterday, He believes ceasing use of marijuana, after many years of heavy use, has contributed to his depressed mood. He currently resides in an [facility] sober living house in [place]. He also attends daily NA meetings, currently has supportive sponsor.    He has been diagnosed with depression in the past. He is currently followed by [facility], medication management only. He reports he would like to follow up with outpatient counseling moving forward.   Brian Day is somewhat reluctant to begin antidepressant medication. He recently "lost two weeks of time and I can't remember anything that happened in those two weeks." He has upcoming court date related to destruction of an ATM machine while taking a combination of Latuda, Vyvanse and Zubsolv.   Discussed plan for inpatient hospitalization. Patient verbalizes understanding. Offered support and encouragement.   1337 Patient requests discharge.  Brian Day reports readiness to discharge at this time.  He denies suicidal  ideations currently.  He contracts verbally for safety with this Probation officer.  Brian Day reports that his primary support is his mother.  He gives verbal consent for me to speak with his mother, Zollie Beckers phone number [phone].  Spoke with Hill's mother, Eustaquio Maize who denies concerns for patient safety.  Patient's mother reports he can return to her home at any time.  Patient's mother reports she will follow up with patient frequently.  Diagnosis:  Final diagnoses:  MDD (major depressive disorder), recurrent, severe, with psychosis ([place])    Total Time spent with patient: 30 minutes  Past Psychiatric History: Severe, recurrent  MDD, ADHD, Opiate dependence Past Medical History:  Past Medical History:  Diagnosis Date  . Addiction, opium ([place])   . Anxiety   . Arthritis    "right knee" (07/15/2016)  . CAP (community acquired pneumonia) 05/2014   hx/notes 06/07/2014  . Depression   . GERD (gastroesophageal reflux disease)   . Hypertension   . Migraine    "once q 3-4 years" (07/15/2016)  . Morbid obesity ([place])   . Type II diabetes mellitus ([place])     Past Surgical History:  Procedure Laterality Date  . KNEE ARTHROSCOPY Right 2000s  . LAPAROSCOPIC GASTRIC SLEEVE RESECTION  11/2018   Family History:  Family History  Problem Relation Age of Onset  . Diabetes Mother   . Hypertension Mother   . Hypertension Father    Family Psychiatric  History: None reported Social History:  Social History   Substance and Sexual Activity  Alcohol Use No     Social History   Substance and Sexual Activity  Drug Use Not  Currently  . Types: Marijuana   Comment: smokes regularly    Social History   Socioeconomic History  . Marital status: Single    Spouse name: Not on file  . Number of children: Not on file  . Years of education: Not on file  . Highest education level: Not on file  Occupational History  . Occupation: courier  Tobacco Use  . Smoking status: Current Every Day Smoker     Packs/day: 1.00    Years: 24.00    Pack years: 24.00    Types: Cigarettes  . Smokeless tobacco: Former Systems developer    Types: Snuff, Chew  Vaping Use  . Vaping Use: Never used  Substance and Sexual Activity  . Alcohol use: No  . Drug use: Not Currently    Types: Marijuana    Comment: smokes regularly  . Sexual activity: Never  Other Topics Concern  . Not on file  Social History Narrative  . Not on file   Social Determinants of Health   Financial Resource Strain: Not on file  Food Insecurity: Not on file  Transportation Needs: Not on file  Physical Activity: Not on file  Stress: Not on file  Social Connections: Not on file   SDOH:  SDOH Screenings   Alcohol Screen: Not on file  Depression (CLE7-5): Not on file  Financial Resource Strain: Not on file  Food Insecurity: Not on file  Housing: Not on file  Physical Activity: Not on file  Social Connections: Not on file  Stress: Not on file  Tobacco Use: High Risk  . Smoking Tobacco Use: Current Every Day Smoker  . Smokeless Tobacco Use: Former Soil scientist Needs: Not on file   Additional Social History:    Pain Medications: see MAR Prescriptions: see MAR Over the Counter: see MAR History of alcohol / drug use?: Yes Name of Substance 1: cannabis 1 - Age of First Use: unknown 1 - Amount (size/oz): variable 1 - Frequency: variable 1 - Duration: variable 1 - Last Use / Amount: unknown 1 - Method of Aquiring: street 1- Route of Use: oral/smoke Name of Substance 2: MAT medication 2 - Age of First Use: unknown 2 - Amount (size/oz): unknown 2 - Frequency: daily 2 - Duration: unknown 2 - Last Use / Amount: today 2 - Method of Aquiring: MD 2 - Route of Substance Use: oral/pills                Sleep: Fair  Appetite:  Good  Current Medications:  Current Facility-Administered Medications  Medication Dose Route Frequency Provider Last Rate Last Admin  . acetaminophen (TYLENOL) tablet 650 mg  650 mg Oral  Q6H PRN Ajibola, Ene A, NP      . alum & mag hydroxide-simeth (MAALOX/MYLANTA) 200-200-20 MG/5ML suspension 30 mL  30 mL Oral Q4H PRN Ajibola, Ene A, NP      . hydrOXYzine (ATARAX/VISTARIL) tablet 25 mg  25 mg Oral TID PRN Ajibola, Ene A, NP      . magnesium hydroxide (MILK OF MAGNESIA) suspension 30 mL  30 mL Oral Daily PRN Ajibola, Ene A, NP      . sertraline (ZOLOFT) tablet 25 mg  25 mg Oral Daily Ajibola, Ene A, NP      . traZODone (DESYREL) tablet 50 mg  50 mg Oral QHS PRN Ajibola, Ene A, NP       Current Outpatient Medications  Medication Sig Dispense Refill  . gabapentin (NEURONTIN) 300 MG capsule Take 3 capsules (900  mg total) by mouth 4 (four) times daily. (Patient taking differently: Take 900 mg by mouth 3 (three) times daily. ) 1080 capsule 3  . Insulin Syringe-Needle U-100 31G X 15/64" 1 ML MISC The patient is insulin requiring, ICD 10 code E10.9. The patient tests 4 times per day. 100 each 0  . VYVANSE 20 MG capsule Take 20 mg by mouth daily.    . ZUBSOLV 5.7-1.4 MG SUBL Take 1 tablet by mouth 2 (two) times daily as needed for pain.      Labs  Lab Results:  Admission on 06/16/2020  Component Date Value Ref Range Status  . SARS Coronavirus 2 by RT PCR 06/16/2020 NEGATIVE  NEGATIVE Final   Comment: (NOTE) SARS-CoV-2 target nucleic acids are NOT DETECTED.  The SARS-CoV-2 RNA is generally detectable in upper respiratory specimens during the acute phase of infection. The lowest concentration of SARS-CoV-2 viral copies this assay can detect is 138 copies/mL. A negative result does not preclude SARS-Cov-2 infection and should not be used as the sole basis for treatment or other patient management decisions. A negative result may occur with  improper specimen collection/handling, submission of specimen other than nasopharyngeal swab, presence of viral mutation(s) within the areas targeted by this assay, and inadequate number of viral copies(<138 copies/mL). A negative result must  be combined with clinical observations, patient history, and epidemiological information. The expected result is Negative.  Fact Sheet for Patients:  EntrepreneurPulse.com.au  Fact Sheet for Healthcare Providers:  IncredibleEmployment.be  This test is no                          t yet approved or cleared by the [place] FDA and  has been authorized for detection and/or diagnosis of SARS-CoV-2 by FDA under an Emergency Use Authorization (EUA). This EUA will remain  in effect (meaning this test can be used) for the duration of the COVID-19 declaration under Section 564(b)(1) of the Act, 21 U.S.C.section 360bbb-3(b)(1), unless the authorization is terminated  or revoked sooner.      . Influenza A by PCR 06/16/2020 NEGATIVE  NEGATIVE Final  . Influenza B by PCR 06/16/2020 NEGATIVE  NEGATIVE Final   Comment: (NOTE) The Xpert Xpress SARS-CoV-2/FLU/RSV plus assay is intended as an aid in the diagnosis of influenza from Nasopharyngeal swab specimens and should not be used as a sole basis for treatment. Nasal washings and aspirates are unacceptable for Xpert Xpress SARS-CoV-2/FLU/RSV testing.  Fact Sheet for Patients: EntrepreneurPulse.com.au  Fact Sheet for Healthcare Providers: IncredibleEmployment.be  This test is not yet approved or cleared by the [place] FDA and has been authorized for detection and/or diagnosis of SARS-CoV-2 by FDA under an Emergency Use Authorization (EUA). This EUA will remain in effect (meaning this test can be used) for the duration of the COVID-19 declaration under Section 564(b)(1) of the Act, 21 U.S.C. section 360bbb-3(b)(1), unless the authorization is terminated or revoked.  Performed at [facility], [place] [address] [place], [place] [address]   . SARS Coronavirus 2 Ag 06/16/2020 Negative  Negative Preliminary  . WBC 06/16/2020 9.3  4.0 - 10.5 K/uL Final  .  RBC 06/16/2020 4.73  4.22 - 5.81 MIL/uL Final  . Hemoglobin 06/16/2020 16.1  13.0 - 17.0 g/dL Final  . HCT 06/16/2020 45.0  39.0 - 52.0 % Final  . MCV 06/16/2020 95.1  80.0 - 100.0 fL Final  . MCH 06/16/2020 34.0  26.0 - 34.0 pg Final  .  MCHC 06/16/2020 35.8  30.0 - 36.0 g/dL Final  . RDW 06/16/2020 12.0  11.5 - 15.5 % Final  . Platelets 06/16/2020 215  150 - 400 K/uL Final  . nRBC 06/16/2020 0.0  0.0 - 0.2 % Final  . Neutrophils Relative % 06/16/2020 42  % Final  . Neutro Abs 06/16/2020 3.9  1.7 - 7.7 K/uL Final  . Lymphocytes Relative 06/16/2020 47  % Final  . Lymphs Abs 06/16/2020 4.3* 0.7 - 4.0 K/uL Final  . Monocytes Relative 06/16/2020 9  % Final  . Monocytes Absolute 06/16/2020 0.8  0.1 - 1.0 K/uL Final  . Eosinophils Relative 06/16/2020 2  % Final  . Eosinophils Absolute 06/16/2020 0.2  0.0 - 0.5 K/uL Final  . Basophils Relative 06/16/2020 0  % Final  . Basophils Absolute 06/16/2020 0.0  0.0 - 0.1 K/uL Final  . Immature Granulocytes 06/16/2020 0  % Final  . Abs Immature Granulocytes 06/16/2020 0.02  0.00 - 0.07 K/uL Final   Performed at [facility], [place] [address], [place], [place] [address]  . Sodium 06/16/2020 137  135 - 145 mmol/L Final  . Potassium 06/16/2020 3.6  3.5 - 5.1 mmol/L Final  . Chloride 06/16/2020 101  98 - 111 mmol/L Final  . CO2 06/16/2020 29  22 - 32 mmol/L Final  . Glucose, Bld 06/16/2020 161* 70 - 99 mg/dL Final   Glucose reference range applies only to samples taken after fasting for at least 8 hours.  . BUN 06/16/2020 13  6 - 20 mg/dL Final  . Creatinine, Ser 06/16/2020 0.71  0.61 - 1.24 mg/dL Final  . Calcium 06/16/2020 8.9  8.9 - 10.3 mg/dL Final  . Total Protein 06/16/2020 7.1  6.5 - 8.1 g/dL Final  . Albumin 06/16/2020 4.0  3.5 - 5.0 g/dL Final  . AST 06/16/2020 14* 15 - 41 U/L Final  . ALT 06/16/2020 13  0 - 44 U/L Final  . Alkaline Phosphatase 06/16/2020 60  38 - 126 U/L Final  . Total Bilirubin 06/16/2020 0.4  0.3 - 1.2 mg/dL Final  .  GFR, Estimated 06/16/2020 >60  >60 mL/min Final   Comment: (NOTE) Calculated using the CKD-EPI Creatinine Equation (2021)   . Anion gap 06/16/2020 7  5 - 15 Final   Performed at [facility] [address] [place], [place] [address]  . Hgb A1c MFr Bld 06/16/2020 5.7* 4.8 - 5.6 % Final   Comment: (NOTE) Pre diabetes:          5.7%-6.4%  Diabetes:              >6.4%  Glycemic control for   <7.0% adults with diabetes   . Mean Plasma Glucose 06/16/2020 116.89  mg/dL Final   Performed at [facility] [address], [place], [place] [address]  . TSH 06/16/2020 2.003  0.350 - 4.500 uIU/mL Final   Comment: Performed by a 3rd Generation assay with a functional sensitivity of <=0.01 uIU/mL. Performed at [facility], [place] [address] [place], [place] [address]   . POC Amphetamine UR 06/16/2020 None Detected  NONE DETECTED (Cut Off Level 1000 ng/mL) Final  . POC Secobarbital (BAR) 06/16/2020 None Detected  NONE DETECTED (Cut Off Level 300 ng/mL) Final  . POC Buprenorphine (BUP) 06/16/2020 Positive* NONE DETECTED (Cut Off Level 10 ng/mL) Final  . POC Oxazepam (BZO) 06/16/2020 None Detected  NONE DETECTED (Cut Off Level 300 ng/mL) Final  . POC Cocaine UR  06/16/2020 None Detected  NONE DETECTED (Cut Off Level 300 ng/mL) Final  . POC Methamphetamine UR 06/16/2020 None Detected  NONE DETECTED (Cut Off Level 1000 ng/mL) Final  . POC Morphine 06/16/2020 None Detected  NONE DETECTED (Cut Off Level 300 ng/mL) Final  . POC Oxycodone UR 06/16/2020 None Detected  NONE DETECTED (Cut Off Level 100 ng/mL) Final  . POC Methadone UR 06/16/2020 None Detected  NONE DETECTED (Cut Off Level 300 ng/mL) Final  . POC Marijuana UR 06/16/2020 Positive* NONE DETECTED (Cut Off Level 50 ng/mL) Final  . SARS Coronavirus 2 Ag 06/16/2020 NEGATIVE  NEGATIVE Final   Comment: (NOTE) SARS-CoV-2 antigen NOT DETECTED.   Negative results are presumptive.  Negative results do not preclude SARS-CoV-2 infection  and should not be used as the sole basis for treatment or other patient management decisions, including infection  control decisions, particularly in the presence of clinical signs and  symptoms consistent with COVID-19, or in those who have been in contact with the virus.  Negative results must be combined with clinical observations, patient history, and epidemiological information. The expected result is Negative.  Fact Sheet for Patients: HandmadeRecipes.com.cy  Fact Sheet for Healthcare Providers: FuneralLife.at  This test is not yet approved or cleared by the [place] FDA and  has been authorized for detection and/or diagnosis of SARS-CoV-2 by FDA under an Emergency Use Authorization (EUA).  This EUA will remain in effect (meaning this test can be used) for the duration of  the COV                          ID-19 declaration under Section 564(b)(1) of the Act, 21 U.S.C. section 360bbb-3(b)(1), unless the authorization is terminated or revoked sooner.    . Cholesterol 06/16/2020 188  0 - 200 mg/dL Final  . Triglycerides 06/16/2020 270* <150 mg/dL Final  . HDL 06/16/2020 37* >40 mg/dL Final  . Total CHOL/HDL Ratio 06/16/2020 5.1  RATIO Final  . VLDL 06/16/2020 54* 0 - 40 mg/dL Final  . LDL Cholesterol 06/16/2020 97  0 - 99 mg/dL Final   Comment:        Total Cholesterol/HDL:CHD Risk Coronary Heart Disease Risk Table                     Men   Women  1/2 Average Risk   3.4   3.3  Average Risk       5.0   4.4  2 X Average Risk   9.6   7.1  3 X Average Risk  23.4   11.0        Use the calculated Patient Ratio above and the CHD Risk Table to determine the patient's CHD Risk.        ATP III CLASSIFICATION (LDL):  <100     mg/dL   Optimal  100-129  mg/dL   Near or Above                    Optimal  130-159  mg/dL   Borderline  160-189  mg/dL   High  >190     mg/dL   Very High Performed at [facility]  [address] [place], [place] [address]   Admission on 03/05/2020, Discharged on 03/05/2020  Component Date Value Ref Range Status  . Lipase 03/05/2020 20  11 - 51 U/L Final   Performed at [facility], [place]  [address] [place], [place] [address]  . Sodium 03/05/2020 137  135 - 145 mmol/L Final  . Potassium 03/05/2020 3.7  3.5 - 5.1 mmol/L Final  . Chloride 03/05/2020 100  98 - 111 mmol/L Final  . CO2 03/05/2020 25  22 - 32 mmol/L Final  . Glucose, Bld 03/05/2020 233* 70 - 99 mg/dL Final   Glucose reference range applies only to samples taken after fasting for at least 8 hours.  . BUN 03/05/2020 7  6 - 20 mg/dL Final  . Creatinine, Ser 03/05/2020 0.78  0.61 - 1.24 mg/dL Final  . Calcium 03/05/2020 8.8* 8.9 - 10.3 mg/dL Final  . Total Protein 03/05/2020 6.7  6.5 - 8.1 g/dL Final  . Albumin 03/05/2020 3.1* 3.5 - 5.0 g/dL Final  . AST 03/05/2020 16  15 - 41 U/L Final  . ALT 03/05/2020 14  0 - 44 U/L Final  . Alkaline Phosphatase 03/05/2020 71  38 - 126 U/L Final  . Total Bilirubin 03/05/2020 0.6  0.3 - 1.2 mg/dL Final  . GFR, Estimated 03/05/2020 >60  >60 mL/min Final   Comment: (NOTE) Calculated using the CKD-EPI Creatinine Equation (2021)   . Anion gap 03/05/2020 12  5 - 15 Final   Performed at [facility], [place] [address], [place], [place] [address]  . WBC 03/05/2020 7.6  4.0 - 10.5 K/uL Final  . RBC 03/05/2020 4.20* 4.22 - 5.81 MIL/uL Final  . Hemoglobin 03/05/2020 14.4  13.0 - 17.0 g/dL Final  . HCT 03/05/2020 41.4  39.0 - 52.0 % Final  . MCV 03/05/2020 98.6  80.0 - 100.0 fL Final  . MCH 03/05/2020 34.3* 26.0 - 34.0 pg Final  . MCHC 03/05/2020 34.8  30.0 - 36.0 g/dL Final  . RDW 03/05/2020 11.5  11.5 - 15.5 % Final  . Platelets 03/05/2020 247  150 - 400 K/uL Final  . nRBC 03/05/2020 0.0  0.0 - 0.2 % Final   Performed at [facility] [address], [place], [place] [address]  Admission on 01/14/2020, Discharged on 01/15/2020  Component Date Value Ref Range Status  .  WBC 01/15/2020 9.8  4.0 - 10.5 K/uL Final  . RBC 01/15/2020 4.14* 4.22 - 5.81 MIL/uL Final  . Hemoglobin 01/15/2020 14.2  13.0 - 17.0 g/dL Final  . HCT 01/15/2020 40.5  39.0 - 52.0 % Final  . MCV 01/15/2020 97.8  80.0 - 100.0 fL Final  . MCH 01/15/2020 34.3* 26.0 - 34.0 pg Final  . MCHC 01/15/2020 35.1  30.0 - 36.0 g/dL Final  . RDW 01/15/2020 11.7  11.5 - 15.5 % Final  . Platelets 01/15/2020 217  150 - 400 K/uL Final  . nRBC 01/15/2020 0.0  0.0 - 0.2 % Final  . Neutrophils Relative % 01/15/2020 58  % Final  . Neutro Abs 01/15/2020 5.6  1.7 - 7.7 K/uL Final  . Lymphocytes Relative 01/15/2020 30  % Final  . Lymphs Abs 01/15/2020 3.0  0.7 - 4.0 K/uL Final  . Monocytes Relative 01/15/2020 10  % Final  . Monocytes Absolute 01/15/2020 1.0  0.1 - 1.0 K/uL Final  . Eosinophils Relative 01/15/2020 2  % Final  . Eosinophils Absolute 01/15/2020 0.2  0.0 - 0.5 K/uL Final  . Basophils Relative 01/15/2020 0  % Final  . Basophils Absolute 01/15/2020 0.0  0.0 - 0.1 K/uL Final  . Immature Granulocytes 01/15/2020 0  % Final  . Abs Immature Granulocytes 01/15/2020 0.04  0.00 - 0.07 K/uL Final  Performed at [facility], [place] [place], [place] [address]  . Sodium 01/15/2020 137  135 - 145 mmol/L Final  . Potassium 01/15/2020 3.6  3.5 - 5.1 mmol/L Final  . Chloride 01/15/2020 97* 98 - 111 mmol/L Final  . CO2 01/15/2020 30  22 - 32 mmol/L Final  . Glucose, Bld 01/15/2020 124* 70 - 99 mg/dL Final   Glucose reference range applies only to samples taken after fasting for at least 8 hours.  . BUN 01/15/2020 12  6 - 20 mg/dL Final  . Creatinine, Ser 01/15/2020 0.62  0.61 - 1.24 mg/dL Final  . Calcium 01/15/2020 8.4* 8.9 - 10.3 mg/dL Final  . GFR, Estimated 01/15/2020 >60  >60 mL/min Final   Comment: (NOTE) Calculated using the CKD-EPI Creatinine Equation (2021)   . Anion gap 01/15/2020 10  5 - 15 Final   Performed at [facility], [place] [place], [place] [address]   . D-Dimer, Quant 01/15/2020 0.49  0.00 - 0.50 ug/mL-FEU Final   Comment: (NOTE) At the manufacturer cut-off value of 0.5 g/mL FEU, this assay has a negative predictive value of 95-100%.This assay is intended for use in conjunction with a clinical pretest probability (PTP) assessment model to exclude pulmonary embolism (PE) and deep venous thrombosis (DVT) in outpatients suspected of PE or DVT. Results should be correlated with clinical presentation. Performed at [facility], [place] [place], [place] [address]   . Sed Rate 01/15/2020 49* 0 - 16 mm/hr Final   Performed at [facility], [place] [place], [place] [address]  . CRP 01/15/2020 4.8* <1.0 mg/dL Final   Performed at [facility] [address], [place], [place] [address]  . Specimen Description 01/15/2020    Final                   Value:TOE RIGHT Performed at [facility], [place] [place], [place] [address]   . Special Requests 01/15/2020    Final                   Value:NONE Performed at [facility], [place] [place], [place] [address]   . Gram Stain 01/15/2020    Final                   Value:RARE WBC PRESENT, PREDOMINANTLY PMN FEW GRAM POSITIVE COCCI IN CLUSTERS Performed at [facility], [place] [address], [place],  [address]   . Culture 01/15/2020 FEW STAPHYLOCOCCUS AUREUS   Final  . Report Status 01/15/2020 01/17/2020 FINAL   Final  . Organism ID, Bacteria 01/15/2020 STAPHYLOCOCCUS AUREUS   Final  . Glucose-Capillary 01/15/2020 119* 70 - 99 mg/dL Final   Glucose reference range applies only to samples taken after fasting for at least 8 hours.  Marland Kitchen SARS Coronavirus 2 by RT PCR 01/15/2020 NEGATIVE  NEGATIVE Final   Comment: (NOTE) SARS-CoV-2 target nucleic acids are NOT DETECTED.  The SARS-CoV-2 RNA is generally detectable in upper respiratoy specimens during the acute phase of infection. The lowest concentration of  SARS-CoV-2 viral copies this assay can detect is 131 copies/mL. A negative result does not preclude SARS-Cov-2 infection and should not be used as the sole basis for treatment or other patient management decisions. A negative result may occur with  improper specimen collection/handling, submission of specimen other than nasopharyngeal swab,  presence of viral mutation(s) within the areas targeted by this assay, and inadequate number of viral copies (<131 copies/mL). A negative result must be combined with clinical observations, patient history, and epidemiological information. The expected result is Negative.  Fact Sheet for Patients:  PinkCheek.be  Fact Sheet for Healthcare Providers:  GravelBags.it  This test is no                          t yet approved or cleared by the [place] FDA and  has been authorized for detection and/or diagnosis of SARS-CoV-2 by FDA under an Emergency Use Authorization (EUA). This EUA will remain  in effect (meaning this test can be used) for the duration of the COVID-19 declaration under Section 564(b)(1) of the Act, 21 U.S.C. section 360bbb-3(b)(1), unless the authorization is terminated or revoked sooner.    . Influenza A by PCR 01/15/2020 NEGATIVE  NEGATIVE Final  . Influenza B by PCR 01/15/2020 NEGATIVE  NEGATIVE Final   Comment: (NOTE) The Xpert Xpress SARS-CoV-2/FLU/RSV assay is intended as an aid in  the diagnosis of influenza from Nasopharyngeal swab specimens and  should not be used as a sole basis for treatment. Nasal washings and  aspirates are unacceptable for Xpert Xpress SARS-CoV-2/FLU/RSV  testing.  Fact Sheet for Patients: PinkCheek.be  Fact Sheet for Healthcare Providers: GravelBags.it  This test is not yet approved or cleared by the [place] FDA and  has been authorized for detection and/or diagnosis of  SARS-CoV-2 by  FDA under an Emergency Use Authorization (EUA). This EUA will remain  in effect (meaning this test can be used) for the duration of the  Covid-19 declaration under Section 564(b)(1) of the Act, 21  U.S.C. section 360bbb-3(b)(1), unless the authorization is  terminated or revoked. Performed at [facility], [place] [place], [place] [address]   . Hgb A1c MFr Bld 01/15/2020 5.7* 4.8 - 5.6 % Final   Comment: (NOTE)         Prediabetes: 5.7 - 6.4         Diabetes: >6.4         Glycemic control for adults with diabetes: <7.0   . Mean Plasma Glucose 01/15/2020 117  mg/dL Final   Comment: (NOTE) Performed At: [facility] [place], [place] [id number] Rush Farmer MD BH:[id number]   . Prealbumin 01/15/2020 10.4* 18 - 38 mg/dL Final   Performed at [facility], [place] [address] [place], [place] [address]  . Specimen Description 01/15/2020 BLOOD RIGHT ARM   Final  . Special Requests 01/15/2020 BOTTLES DRAWN AEROBIC AND ANAEROBIC Blood Culture adequate volume   Final  . Culture 01/15/2020    Final                   Value:NO GROWTH 5 DAYS Performed at [facility], [place] [address] [place], [place] [address]   . Report Status 01/15/2020 01/20/2020 FINAL   Final  . Specimen Description 01/15/2020 BLOOD LEFT ARM   Final  . Special Requests 01/15/2020 BOTTLES DRAWN AEROBIC AND ANAEROBIC Blood Culture adequate volume   Final  . Culture 01/15/2020    Final                   Value:NO GROWTH 5 DAYS Performed at [facility], [place] [address], , [place] [address]   . Report Status 01/15/2020 01/20/2020 FINAL   Final  .  HIV Screen 4th Generation wRfx 01/15/2020 Non Reactive  Non Reactive Final   Performed at [facility], [place] [address], [place], [place] [address]    Blood Alcohol level:  No results found for: [facility]  Metabolic Disorder Labs: Lab Results  Component Value Date   HGBA1C 5.7 (H) 06/16/2020   MPG  116.89 06/16/2020   MPG 117 01/15/2020   No results found for: PROLACTIN Lab Results  Component Value Date   CHOL 188 06/16/2020   TRIG 270 (H) 06/16/2020   HDL 37 (L) 06/16/2020   CHOLHDL 5.1 06/16/2020   VLDL 54 (H) 06/16/2020   LDLCALC 97 06/16/2020    Therapeutic Lab Levels: No results found for: LITHIUM No results found for: VALPROATE No components found for:  CBMZ  Physical Findings   GAD-7   Flowsheet Row Office Visit from 09/23/2016 in [place]  Total GAD-7 Score 8    PHQ2-9   [place] Office Visit from 02/16/2017 in [place] Office Visit from 01/23/2017 in [place] Office Visit from 11/12/2016 in [place] Office Visit from 10/30/2016 in [place] Office Visit from 10/22/2016 in [place]  PHQ-2 Total Score 5 6 4 4 4   PHQ-9 Total Score 22 22 19 20 21     Flowsheet Row ED from 06/16/2020 in [place] CATEGORY High Risk       Musculoskeletal  Strength & Muscle Tone: within normal limits Gait & Station: normal Patient leans: N/A  Psychiatric Specialty Exam  Presentation  General Appearance: Appropriate for Environment; Casual  Eye Contact:Good  Speech:Clear and Coherent; Normal Rate  Speech Volume:Normal  Handedness:Right   Mood and Affect  Mood:Depressed  Affect:Tearful; Depressed   Thought Process  Thought Processes:Coherent; Goal Directed  Descriptions of Associations:Intact  Orientation:Full (Time, Place and Person)  Thought Content:Logical  Diagnosis of Schizophrenia or Schizoaffective disorder in past: No    Hallucinations:Hallucinations: None  Ideas of Reference:None  Suicidal Thoughts:Suicidal Thoughts: Yes, Passive SI Active Intent and/or Plan: Without Intent; Without Plan  Homicidal Thoughts:Homicidal Thoughts: No   Sensorium   Memory:Immediate Good; Recent Good; Remote Good  Judgment:Good  Insight:Fair   Executive Functions  Concentration:Good  Attention Span:Good  [place] of Knowledge:Good  Language:Good   Psychomotor Activity  Psychomotor Activity:Psychomotor Activity: Normal   Assets  Assets:Communication Skills; Desire for Improvement; Financial Resources/Insurance; Housing; Leisure Time; Intimacy; Physical Health; Resilience; Social Support; Talents/Skills; Transportation   Sleep  Sleep:Sleep: Fair Number of Hours of Sleep: 3   Nutritional Assessment (For OBS and FBC admissions only) Has the patient had a weight loss or gain of 10 pounds or more in the last 3 months?: No Has the patient had a decrease in food intake/or appetite?: Yes Does the patient have dental problems?: No Does the patient have eating habits or behaviors that may be indicators of an eating disorder including binging or inducing vomiting?: No Has the patient recently lost weight without trying?: No Has the patient been eating poorly because of a decreased appetite?: No Malnutrition Screening Tool Score: 0    Physical Exam  Physical Exam Vitals and nursing note reviewed.  Constitutional:      Appearance: He is well-developed.  HENT:     Head: Normocephalic.  Cardiovascular:     Rate and Rhythm: Normal rate.  Pulmonary:     Effort: Pulmonary effort is normal.  Neurological:     Mental Status: He is alert and oriented to person, place, and time.  Psychiatric:        Attention and Perception: Attention and perception normal.        Mood and Affect: Mood is depressed. Affect is tearful.        Speech: Speech normal.        Behavior: Behavior normal. Behavior is cooperative.        Thought Content: Thought content includes suicidal ideation.        Cognition and Memory: Cognition and memory normal.        Judgment: Judgment normal.    Review of Systems  Constitutional: Negative.   HENT:  Negative.   Eyes: Negative.   Respiratory: Negative.   Cardiovascular: Negative.   Gastrointestinal: Negative.   Genitourinary: Negative.   Musculoskeletal: Negative.   Skin: Negative.   Neurological: Negative.   Endo/Heme/Allergies: Negative.   Psychiatric/Behavioral: Positive for depression, substance abuse and suicidal ideas.   Blood pressure 125/78, pulse 70, temperature 97.7 F (36.5 C), temperature source Oral, resp. rate 18, SpO2 100 %. There is no height or weight on file to calculate BMI.  Treatment Plan Summary: Patient reviewed with Dr Mallie Darting. Plan Follow-up with outpatient psychiatry at [facility] and substance use treatment resources provided.  Discharge  [place]aline Kluver, FNP 06/17/2020 11:14 AM

## 2020-06-17 NOTE — ED Notes (Signed)
CBG was not collected by night shift d/t pt refusing b/c sleeping

## 2020-06-17 NOTE — Discharge Instructions (Signed)
Patient is instructed prior to discharge to:  Take all medications as prescribed by his/her mental healthcare provider. Report any adverse effects and or reactions from the medicines to his/her outpatient provider promptly. Keep all scheduled appointments, to ensure that you are getting refills on time and to avoid any interruption in your medication.  If you are unable to keep an appointment call to reschedule.  Be sure to follow-up with resources and follow-up appointments provided.  Patient has been instructed & cautioned: To not engage in alcohol and or illegal drug use while on prescription medicines. In the event of worsening symptoms, patient is instructed to call the crisis hotline, 911 and or go to the nearest ED for appropriate evaluation and treatment of symptoms. To follow-up with his/her primary care provider for your other medical issues, concerns and or health care needs.   Substance abuse resources and Residential Options:  [facility] day residential substance abuse facility (not an option if you have active assault charges). [address], [place], [place] [address] Phone: [phone]: Ask for Shayla in admissions to complete intake if interested in pursuing this option.  [facility]: Can get intake scheduled; (not an option if you have active assault charges). [address] [place], [place] ([phone]) Call Mon-Fri.  [facility]): (offers outpatient therapy and intensive outpatient substance abuse therapy).  [address], [place], [place] [address] Phone: [phone]  [facility]: Offers FREE recovery skills classes, support groups, 1:1 Peer Support, and Compeer Classes. [address], [place], [place] [address] Phone: [phone] (Call to complete intake).   [facility] [address] [place], [place] [address] Phone: [phone] ext [phone]  The [facility] provides food,  shelter and other programs and services to the homeless men of [place] through our men's program.  By offering safe shelter, three meals a day, clean clothing, Biblical counseling, financial planning, vocational training, GED/education and employment assistance, we've helped mend the shattered lives of many homeless men since opening in 1974.  We have approximately 267 beds available, with a max of 312 beds including mats for emergency situations and currently house an average of 270 men a night.  Prospective Client Check-In Information Photo ID Required (State/ Out of State/ DOC) - if photo ID is not available, clients are required to have a printout of a police/sheriff's criminal history report. Help out with chores around the Mission. No sex offender of any type (pending, charged, registered and/or any other sex related offenses) will be permitted to check in. Must be willing to abide by all rules, regulations, and policies established by the [facility]. The following will be provided - shelter, food, clothing, and biblical counseling. If you or someone you know is in need of assistance at our men's shelter in , [place], please call [phone] ext. [phone].  

## 2020-06-17 NOTE — BH Assessment (Signed)
Per Berneice Heinrich, NP, patient meets criteria for inpatient psychiatric treatment. Per psychiatrist (Dr. Jola Babinski) and [facility] ([place] B., RN), no appropriate beds available at [facility] today. Dr. Jola Babinski asked that patient is referred out to alternative facilities for placement needs.   Patient referred to the following hospitals for consideration of bed placement (see list noted below):     [facility]      [facility] Details     [facility] Details     [facility] Details     [facility] Details     [facility] Details     [facility] Details     [facility] Details     [facility] Details     [facility] Details     [facility] Details     [facility] Details     [facility] Details     [facility] Details     [facility] Details     [facility] Details     [facility] Details     [facility] Details     [facility]

## 2020-06-17 NOTE — ED Provider Notes (Signed)
FBC/OBS ASAP Discharge Summary  Date and Time: 06/17/2020 1:41 PM  Name: Brian Day  MRN:  [id number]   Discharge Diagnoses:  Final diagnoses:  MDD (major depressive disorder), recurrent, severe, with psychosis (HCC)    Subjective: Patient reports readiness to discharge home, denies suicidal homicidal ideations.  Patient states "I am fine, I will go to my mom's if I need something."  Stay Summary: Patient admitted with suicidal ideations.     Per H & P:Brian Day is a 40y/o male. Patient presented voluntarily to [facility] with chief complaint of worsening depression, anxiety, and suicidal thoughts. Patient evaluated face to face by this Clinical research associate. Patient is aler and orient X4, he mood/affect is depressed, though process is coherent, speech in of normal volume, rate and tone.     Patient reports that is he is on Zubsolv (buprenorphine and naloxone) due to history of opiate abuse. He reports that he has been trying to wean himself off of Zubsolv for ~2 weeks. He stated that coming off of Zubsolv has worsened his depression and anxiety; and has made him suicidal. He reports that his depressive symptoms include hopelessness, worthlessness, poor appetite, lack of sleep, tiredness, and isolation. He reports that he went back to taking Zubsolv 2 tablets daily yesterday but continues to have suicidal ideations. He reports that he has suicidal ideations with plan to overdose on fentanyl; patient "stated I know how to get enough fentanyl to kill myself but I want to get help so I don't do it."   Patient is unable to contract for safety. He continues to endorse suicidal ideation with plan to overdose on fentanyl. He reports that his family is worried about him and he is unsure if he can maintain safety at home. He denies HI, AVH and there are no indications of delusions. He endorses paranoia and states "I feel like something is always watching me and people talking about me." He endorses daily marijuana  use; he denies alcohol and other illicit drug use. 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 He reports that he has being cleaned off opiates for about 3years however review of PDMP shows that he had a 5 days prescription for oxycodone-acetaminophen 10-325mg  in 02/2020.    06/17/2020 Patient remained at [facility] behavioral [facility] overnight. Today patient reassessed by nurse practitioner.  He is alert and oriented, pleasant and cooperative.  He reports he is "feeling better."  Patient denies auditory visual hallucinations, there is no evidence of delusional thought content and no indication that patient is responding to internal stimuli.  Patient offered support and encouragement.  Patient gives verbal consent to speak with his mother, Clyda Hurdle phone number [phone].  Spoke with patient's mother who denies concerns for patient safety.  Patient's mother also shares that patient can return to her home if he desires.  Total Time spent with patient: 30 minutes  Past Psychiatric History: Major depressive disorder, opioid use disorder, ADHD Past Medical History:  Past Medical History:  Diagnosis Date  . Addiction, opium (HCC)   . Anxiety   . Arthritis    "right knee" (07/15/2016)  . CAP (community acquired pneumonia) 05/2014   hx/notes 06/07/2014  . Depression   . GERD (gastroesophageal reflux disease)   . Hypertension   . Migraine    "once q 3-4 years" (07/15/2016)  . Morbid obesity (HCC)   . Type II diabetes mellitus (HCC)     Past Surgical History:  Procedure  Laterality Date  . KNEE ARTHROSCOPY Right 2000s  . LAPAROSCOPIC GASTRIC SLEEVE RESECTION  11/2018   Family History:  Family History  Problem Relation Age of Onset  . Diabetes Mother   . Hypertension Mother   . Hypertension Father    Family Psychiatric History: None reported Social History:  Social History   Substance and Sexual Activity  Alcohol Use No      Social History   Substance and Sexual Activity  Drug Use Not Currently  . Types: Marijuana   Comment: smokes regularly    Social History   Socioeconomic History  . Marital status: Single    Spouse name: Not on file  . Number of children: Not on file  . Years of education: Not on file  . Highest education level: Not on file  Occupational History  . Occupation: courier  Tobacco Use  . Smoking status: Current Every Day Smoker    Packs/day: 1.00    Years: 24.00    Pack years: 24.00    Types: Cigarettes  . Smokeless tobacco: Former Neurosurgeon    Types: Snuff, Chew  Vaping Use  . Vaping Use: Never used  Substance and Sexual Activity  . Alcohol use: No  . Drug use: Not Currently    Types: Marijuana    Comment: smokes regularly  . Sexual activity: Never  Other Topics Concern  . Not on file  Social History Narrative  . Not on file   Social Determinants of Health   Financial Resource Strain: Not on file  Food Insecurity: Not on file  Transportation Needs: Not on file  Physical Activity: Not on file  Stress: Not on file  Social Connections: Not on file   SDOH:  SDOH Screenings   Alcohol Screen: Not on file  Depression (NWG9-5): Not on file  Financial Resource Strain: Not on file  Food Insecurity: Not on file  Housing: Not on file  Physical Activity: Not on file  Social Connections: Not on file  Stress: Not on file  Tobacco Use: High Risk  . Smoking Tobacco Use: Current Every Day Smoker  . Smokeless Tobacco Use: Former Dispensing optician Needs: Not on file    Has this patient used any form of tobacco in the last 30 days? (Cigarettes, Smokeless Tobacco, Cigars, and/or Pipes) A prescription for an FDA-approved tobacco cessation medication was offered at discharge and the patient refused  Current Medications:  Current Facility-Administered Medications  Medication Dose Route Frequency Provider Last Rate Last Admin  . acetaminophen (TYLENOL) tablet 650 mg  650 mg  Oral Q6H PRN Ajibola, Ene A, NP      . alum & mag hydroxide-simeth (MAALOX/MYLANTA) 200-200-20 MG/5ML suspension 30 mL  30 mL Oral Q4H PRN Ajibola, Ene A, NP      . hydrOXYzine (ATARAX/VISTARIL) tablet 50 mg  50 mg Oral TID PRN Oneta Rack, NP   25 mg at 06/17/20 1300  . magnesium hydroxide (MILK OF MAGNESIA) suspension 30 mL  30 mL Oral Daily PRN Ajibola, Ene A, NP      . sertraline (ZOLOFT) tablet 25 mg  25 mg Oral Daily Ajibola, Ene A, NP      . traZODone (DESYREL) tablet 50 mg  50 mg Oral QHS PRN Ajibola, Ene A, NP       Current Outpatient Medications  Medication Sig Dispense Refill  . dicyclomine (BENTYL) 20 MG tablet Take 1 tablet by mouth at bedtime as needed.    . ZUBSOLV 5.7-1.4 MG SUBL Take  1 tablet by mouth 2 (two) times daily as needed for pain.      PTA Medications: (Not in a hospital admission)   Musculoskeletal  Strength & Muscle Tone: within normal limits Gait & Station: normal Patient leans: N/A  Psychiatric Specialty Exam  Presentation  General Appearance: Appropriate for Environment; Casual  Eye Contact:Good  Speech:Clear and Coherent; Normal Rate  Speech Volume:Normal  Handedness:Right   Mood and Affect  Mood:Depressed  Affect:Tearful; Depressed   Thought Process  Thought Processes:Coherent; Goal Directed  Descriptions of Associations:Intact  Orientation:Full (Time, Place and Person)  Thought Content:Logical  Diagnosis of Schizophrenia or Schizoaffective disorder in past: No    Hallucinations:Hallucinations: None  Ideas of Reference:None  Suicidal Thoughts:Suicidal Thoughts: Yes, Passive SI Active Intent and/or Plan: Without Intent; Without Plan  Homicidal Thoughts:Homicidal Thoughts: No   Sensorium  Memory:Immediate Good; Recent Good; Remote Good  Judgment:Good  Insight:Fair   Executive Functions  Concentration:Good  Attention Span:Good  Recall:Good  Fund of Knowledge:Good  Language:Good   Psychomotor Activity   Psychomotor Activity:Psychomotor Activity: Normal   Assets  Assets:Communication Skills; Desire for Improvement; Financial Resources/Insurance; Housing; Leisure Time; Intimacy; Physical Health; Resilience; Social Support; Talents/Skills; Transportation   Sleep  Sleep:Sleep: Fair Number of Hours of Sleep: 3   Nutritional Assessment (For OBS and FBC admissions only) Has the patient had a weight loss or gain of 10 pounds or more in the last 3 months?: No Has the patient had a decrease in food intake/or appetite?: Yes Does the patient have dental problems?: No Does the patient have eating habits or behaviors that may be indicators of an eating disorder including binging or inducing vomiting?: No Has the patient recently lost weight without trying?: No Has the patient been eating poorly because of a decreased appetite?: No Malnutrition Screening Tool Score: 0    Physical Exam  Physical Exam ROS Blood pressure 125/78, pulse 70, temperature 97.7 F (36.5 C), temperature source Oral, resp. rate 18, SpO2 100 %. There is no height or weight on file to calculate BMI.  Demographic Factors:  Male and Caucasian  Loss Factors: NA  Historical Factors: Prior suicide attempts  Risk Reduction Factors:   Sense of responsibility to family, Employed, Living with another person, especially a relative, Positive social support, Positive therapeutic relationship and Positive coping skills or problem solving skills  Continued Clinical Symptoms:  Alcohol/Substance Abuse/Dependencies  Cognitive Features That Contribute To Risk:  None    Suicide Risk:  Minimal: No identifiable suicidal ideation.  Patients presenting with no risk factors but with morbid ruminations; may be classified as minimal risk based on the severity of the depressive symptoms  Plan Of Care/Follow-up recommendations:  Other:  Patient reviewed with Dr. Jola Babinski.  Follow-up with established outpatient psychiatry at [facility]. Follow-up with substance use treatment resources provided.  Disposition: Discharge  Patrcia Dolly, FNP 06/17/2020, 1:41 PM

## 2020-06-17 NOTE — ED Notes (Signed)
Pt asked this writer if we detoxed with suboxone here. Advised we didn't, but they might at [facility] and med management. Asked for # to hospital, gave main #. Pt stated to this writer that he was told that they detox with suboxone at [facility] and [facility] and he wanted to be d/c so he could go to either of those places. Notified provider of pt desires.

## 2020-06-18 ENCOUNTER — Telehealth (HOSPITAL_COMMUNITY): Payer: Self-pay | Admitting: Physician Assistant

## 2020-06-18 LAB — PROLACTIN: Prolactin: 9.5 ng/mL (ref 4.0–15.2)

## 2020-06-18 NOTE — BH Assessment (Signed)
Care Management - Follow Up [facility]UC Discharges   Writer attempted to make contact with patient today and was unsuccessful.  Writer was able to leave a HIPPA compliant voice message and will await callback.   

## 2020-07-04 ENCOUNTER — Emergency Department (HOSPITAL_BASED_OUTPATIENT_CLINIC_OR_DEPARTMENT_OTHER)
Admission: EM | Admit: 2020-07-04 | Discharge: 2020-07-04 | Disposition: A | Discharge status: Home / Self Care | Payer: Medicaid Other | Attending: Emergency Medicine | Admitting: Emergency Medicine

## 2020-07-04 ENCOUNTER — Other Ambulatory Visit: Payer: Self-pay

## 2020-07-04 ENCOUNTER — Encounter (HOSPITAL_BASED_OUTPATIENT_CLINIC_OR_DEPARTMENT_OTHER): Payer: Self-pay | Admitting: *Deleted

## 2020-07-04 DIAGNOSIS — I10 Essential (primary) hypertension: Secondary | ICD-10-CM | POA: Insufficient documentation

## 2020-07-04 DIAGNOSIS — F17210 Nicotine dependence, cigarettes, uncomplicated: Secondary | ICD-10-CM | POA: Diagnosis not present

## 2020-07-04 DIAGNOSIS — R21 Rash and other nonspecific skin eruption: Secondary | ICD-10-CM | POA: Diagnosis present

## 2020-07-04 DIAGNOSIS — L237 Allergic contact dermatitis due to plants, except food: Secondary | ICD-10-CM

## 2020-07-04 DIAGNOSIS — E1169 Type 2 diabetes mellitus with other specified complication: Secondary | ICD-10-CM | POA: Diagnosis not present

## 2020-07-04 DIAGNOSIS — E1142 Type 2 diabetes mellitus with diabetic polyneuropathy: Secondary | ICD-10-CM | POA: Diagnosis not present

## 2020-07-04 DIAGNOSIS — L255 Unspecified contact dermatitis due to plants, except food: Secondary | ICD-10-CM | POA: Insufficient documentation

## 2020-07-04 MED ORDER — HYDROCORTISONE 1 % EX CREA: TOPICAL_CREAM | CUTANEOUS | 0 refills | Status: AC

## 2020-07-04 MED ORDER — PREDNISONE 10 MG (21) PO TBPK: ORAL_TABLET | Freq: Every day | ORAL | 0 refills | Status: DC

## 2020-07-04 MED ORDER — CALAMINE EX LOTN: 1.0000 "application " | TOPICAL_LOTION | CUTANEOUS | 0 refills | Status: AC | PRN

## 2020-07-04 MED ORDER — HYDROCORTISONE 1 % EX CREA: TOPICAL_CREAM | CUTANEOUS | 0 refills | Status: DC

## 2020-07-04 MED ORDER — PREDNISONE 10 MG (21) PO TBPK: ORAL_TABLET | Freq: Every day | ORAL | 0 refills | Status: AC

## 2020-07-04 MED ORDER — CALAMINE EX LOTN: 1.0000 "application " | TOPICAL_LOTION | CUTANEOUS | 0 refills | Status: DC | PRN

## 2020-07-04 NOTE — ED Notes (Signed)
Patient verbalized understanding of dc instructions, prescriptions, follow up referrals and reasons to return to ER for reevaluation.  

## 2020-07-04 NOTE — Discharge Instructions (Addendum)
I have 3 prescriptions to help treat your symptoms.  The first prescription is calamine lotion which should be apply to your upper and lower extremities.  The second prescription are steroids, please take these as prescribed. Be aware this medication can cause appetite changes, insomnia and flushness.

## 2020-07-04 NOTE — ED Provider Notes (Signed)
[facility] EMERGENCY DEPARTMENT Provider Note   CSN: [id number] Arrival date & time: 07/04/20  1341     History Chief Complaint  Patient presents with  . Rash    Brian Day is a 40 y.o. male.  40 y.o male with a PMH of Depression, Anxiety, DM (diet controlled) presents to the ED with a chief complaint of rash x 2 days. 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 He developed a rash to bilateral upper extremities which has now spread to the bilateral lower extremities. He has taken over the counter medication along with receive a steroid injection by his PCP Dr. Laurence Aly without much improvement in symptoms. Rash is pruritic in nature, alleviated with cold air and exacerbated by scratching. He reports no fever, abdominal pain, insect bite, or other systemic signs.   The history is provided by the patient and medical records.       Past Medical History:  Diagnosis Date  . Addiction, opium (HCC)   . Anxiety   . Arthritis    "right knee" (07/15/2016)  . CAP (community acquired pneumonia) 05/2014   hx/notes 06/07/2014  . Depression   . GERD (gastroesophageal reflux disease)   . Hypertension   . Migraine    "once q 3-4 years" (07/15/2016)  . Morbid obesity (HCC)   . Type II diabetes mellitus [facility])     Patient Active Problem List   Diagnosis Date Noted  . Severe recurrent major depression with psychotic features (HCC)   . Diabetic foot infection (HCC) 01/15/2020  . Opiate dependence, continuous (HCC) 01/15/2020  . ADHD (attention deficit hyperactivity disorder) 01/15/2020  . Upper respiratory infection 01/23/2017  . 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 MDD (major depressive disorder), recurrent episode, moderate (HCC) 08/19/2016  . Idiopathic chronic gout of right ankle without tophus    . Chronic pain in right foot 07/15/2016  . Diabetic peripheral neuropathy (HCC) 12/29/2013  . Benign essential hypertension 10/27/2013  . Uncontrolled type 2 diabetes mellitus with complication, with long-term current use of insulin (HCC) 11/23/2012  . Morbid obesity (HCC) 11/23/2012  . Low back pain 11/23/2012    Past Surgical History:  Procedure Laterality Date  . KNEE ARTHROSCOPY Right 2000s  . LAPAROSCOPIC GASTRIC SLEEVE RESECTION  11/2018       Family History  Problem Relation Age of Onset  . Diabetes Mother   . Hypertension Mother   . Hypertension Father     Social History   Tobacco Use  . Smoking status: Current Every Day Smoker    Packs/day: 1.00    Years: 24.00    Pack years: 24.00    Types: Cigarettes  . Smokeless tobacco: Former Neurosurgeon    Types: Snuff, Chew  Vaping Use  . Vaping Use: Never used  Substance Use Topics  . Alcohol use: No  . Drug use: Not Currently    Types: Marijuana    Comment: smokes regularly    Home Medications Prior to Admission medications   Medication Sig Start Date End Date Taking? Authorizing Provider  calamine lotion Apply 1 application topically as needed for up to 7 days for itching. Please apply lotion to the upper and lower extremities. 07/04/20 07/11/20 Yes Jensen Kilburg, Leonie Douglas, PA-C  hydrocortisone cream 1 % Apply to affected area 2 times daily 07/04/20  Yes Lanier Felty,  Carolle Ishii, PA-C  predniSONE (STERAPRED UNI-PAK 21 TAB) 10 MG (21) TBPK tablet Take by mouth daily for 7 days. Take 6 tabs by mouth daily  for 2 days, then 5 tabs for 2 days, then 4 tabs for 2 days, then 3 tabs for 2 days, 2 tabs for 2 days, then 1 tab by mouth daily for 2 days 07/04/20 07/11/20 Yes Jone Panebianco, PA-C  dicyclomine (BENTYL) 20 MG tablet Take 1 tablet by mouth at bedtime as needed.    [provider]  sertraline (ZOLOFT) 25 MG tablet Take 1 tablet (25 mg total) by mouth daily. 06/18/20   Lenard Lance, FNP  ZUBSOLV 5.7-1.4 MG SUBL Take 1 tablet by mouth 2 (two)  times daily as needed for pain. 01/09/20   [provider]    Allergies    Colchicine, Lisinopril, Morphine, and Oxycodone hcl  Review of Systems   Review of Systems  Constitutional: Negative for fever.  Cardiovascular: Negative for chest pain.  Gastrointestinal: Negative for abdominal pain.  Genitourinary: Negative for difficulty urinating.  Skin: Positive for rash.    Physical Exam Updated Vital Signs BP 137/89 (BP Location: Right Arm)   Pulse 96   Temp 98.4 F (36.9 C) (Oral)   Resp 20   Ht 6\' 3"  (1.905 m)   Wt (!) 151.5 kg   SpO2 96%   BMI 41.75 kg/m   Physical Exam Vitals and nursing note reviewed.  Constitutional:      Appearance: Normal appearance.  HENT:     Head: Normocephalic.     Comments: Mild swelling noted to the left eyelid.     Nose: Nose normal.     Mouth/Throat:     Mouth: Mucous membranes are moist.  Cardiovascular:     Rate and Rhythm: Normal rate.  Pulmonary:     Effort: Pulmonary effort is normal.     Breath sounds: No wheezing or rales.  Abdominal:     General: Abdomen is flat.     Tenderness: There is no abdominal tenderness. There is no right CVA tenderness or left CVA tenderness.  Musculoskeletal:     Cervical back: Normal range of motion and neck supple.  Skin:    Findings: Rash present.          Comments: Streaky patchy rash with burrows and small blistering.   Neurological:     Mental Status: He is alert and oriented to person, place, and time.     ED Results / Procedures / Treatments   Labs (all labs ordered are listed, but only abnormal results are displayed) Labs Reviewed - No data to display  EKG None  Radiology No results found.  Procedures Procedures   Medications Ordered in ED Medications - No data to display  ED Course  I have reviewed the triage vital signs and the nursing notes.  Pertinent labs & imaging results that were available during my care of the patient were reviewed by me and  considered in my medical decision making (see chart for details).    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 Has tried over the counter meds without improvement. Vitals are within  normal limits.   Rash began in the upper extremities and has spread to the lower extremities and  Left upper eyelid. There is no mucosal involvement, rash does cross the midline and spread to all extremities.  Burrows and streaky patches noted, denies any insect bite, rash consistent with poison ivy. Given steroid injection by PCP 2 days ago without improvement. Prior hx of diabetes type 2 but currently this is diet controlled without insulin dependence. We discuss risk and benefits of providing him with steroid taper along with calamine lotion and hydrocortisone cream. Patient is agreeable of treatment plan. Return precautions discussed at length.     Portions of this note were generated with Scientist, clinical (histocompatibility and immunogenetics). Dictation errors may occur despite best attempts at proofreading.  Final Clinical Impression(s) / ED Diagnoses Final diagnoses:  Poison ivy dermatitis    Rx / DC Orders ED Discharge Orders         Ordered    calamine lotion  As needed        07/04/20 1421    hydrocortisone cream 1 %        07/04/20 1425    predniSONE (STERAPRED UNI-PAK 21 TAB) 10 MG (21) TBPK tablet  Daily        07/04/20 1425           Claude Manges, PA-C 07/04/20 1450    Alvira Monday, MD 07/06/20 [phone]

## 2020-07-04 NOTE — ED Triage Notes (Addendum)
C/o poison ivy to groin , legs arm and face  x 2 days

## 2020-07-21 ENCOUNTER — Encounter (HOSPITAL_BASED_OUTPATIENT_CLINIC_OR_DEPARTMENT_OTHER): Payer: Self-pay | Admitting: Emergency Medicine

## 2020-07-21 ENCOUNTER — Other Ambulatory Visit: Payer: Self-pay

## 2020-07-21 ENCOUNTER — Emergency Department (HOSPITAL_BASED_OUTPATIENT_CLINIC_OR_DEPARTMENT_OTHER)
Admission: EM | Admit: 2020-07-21 | Discharge: 2020-07-21 | Disposition: A | Discharge status: Home / Self Care | Payer: Medicaid Other | Attending: Emergency Medicine | Admitting: Emergency Medicine

## 2020-07-21 DIAGNOSIS — F17210 Nicotine dependence, cigarettes, uncomplicated: Secondary | ICD-10-CM | POA: Diagnosis not present

## 2020-07-21 DIAGNOSIS — E119 Type 2 diabetes mellitus without complications: Secondary | ICD-10-CM | POA: Diagnosis not present

## 2020-07-21 DIAGNOSIS — I10 Essential (primary) hypertension: Secondary | ICD-10-CM | POA: Insufficient documentation

## 2020-07-21 DIAGNOSIS — L0231 Cutaneous abscess of buttock: Secondary | ICD-10-CM | POA: Diagnosis present

## 2020-07-21 DIAGNOSIS — L0291 Cutaneous abscess, unspecified: Secondary | ICD-10-CM

## 2020-07-21 MED ORDER — SULFAMETHOXAZOLE-TRIMETHOPRIM 800-160 MG PO TABS: 1.0000 | ORAL_TABLET | Freq: Two times a day (BID) | ORAL | 0 refills | Status: AC

## 2020-07-21 NOTE — ED Notes (Signed)
Presents with pain and having some blood noted from his rectum, noted primarily today, for the past several days has had some pressure at the same area. Having normal BM's States blood is bright red. Denies having any fevers, denies having nausea or vomiting.

## 2020-07-21 NOTE — ED Provider Notes (Signed)
[facility] EMERGENCY DEPARTMENT Provider Note   CSN: [id number] Arrival date & time: 07/21/20  1847     History Chief Complaint  Patient presents with  . Rectal Bleeding    Abscess v hemorrhoids    Brian Day is a 40 y.o. male.  Swelling of the left buttock.  Worsening over the last few days.  Has tried warm water in the shower.  It has been draining.  No fevers.  No chills.  Normal bowel movements normal urination normal appetite.  Tender to touch tender to sit.  Nothing makes it better, touching it makes it worse.        Past Medical History:  Diagnosis Date  . Addiction, opium (HCC)   . Anxiety   . Arthritis    "right knee" (07/15/2016)  . CAP (community acquired pneumonia) 05/2014   hx/notes 06/07/2014  . Depression   . GERD (gastroesophageal reflux disease)   . Hypertension   . Migraine    "once q 3-4 years" (07/15/2016)  . Morbid obesity (HCC)   . Type II diabetes mellitus [facility])     Patient Active Problem List   Diagnosis Date Noted  . Severe recurrent major depression with psychotic features (HCC)   . Diabetic foot infection (HCC) 01/15/2020  . Opiate dependence, continuous (HCC) 01/15/2020  . ADHD (attention deficit hyperactivity disorder) 01/15/2020  . Upper respiratory infection 01/23/2017  . 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 MDD (major depressive disorder), recurrent episode, moderate (HCC) 08/19/2016  . Idiopathic chronic gout of right ankle without tophus   . Chronic pain in right foot 07/15/2016  . Diabetic peripheral neuropathy (HCC) 12/29/2013  . Benign essential hypertension 10/27/2013  . Uncontrolled type 2 diabetes mellitus with complication, with long-term current use of insulin (HCC) 11/23/2012  . Morbid obesity (HCC) 11/23/2012  . Low back pain 11/23/2012    Past Surgical History:  Procedure Laterality Date  . KNEE ARTHROSCOPY Right  2000s  . LAPAROSCOPIC GASTRIC SLEEVE RESECTION  11/2018       Family History  Problem Relation Age of Onset  . Diabetes Mother   . Hypertension Mother   . Hypertension Father     Social History   Tobacco Use  . Smoking status: Current Every Day Smoker    Packs/day: 1.00    Years: 24.00    Pack years: 24.00    Types: Cigarettes  . Smokeless tobacco: Former Neurosurgeon    Types: Snuff, Chew  Vaping Use  . Vaping Use: Never used  Substance Use Topics  . Alcohol use: No  . Drug use: Not Currently    Types: Marijuana    Comment: smokes regularly    Home Medications Prior to Admission medications   Medication Sig Start Date End Date Taking? Authorizing Provider  cyclobenzaprine (FLEXERIL) 10 MG tablet Take 10 mg by mouth 3 (three) times daily as needed for muscle spasms.   Yes [provider]  sulfamethoxazole-trimethoprim (BACTRIM DS) 800-160 MG tablet Take 1 tablet by mouth 2 (two) times daily for 5 days. 07/21/20 07/26/20 Yes Sabino Donovan, MD  ZUBSOLV 5.7-1.4 MG SUBL Take 1 tablet by mouth 2 (two) times daily as needed for pain. 01/09/20  Yes [provider]  dicyclomine (BENTYL) 20 MG tablet Take 1 tablet by mouth at bedtime as needed.    [provider]  hydrocortisone cream 1 % Apply to affected area 2 times daily  07/04/20   Claude Manges, PA-C  sertraline (ZOLOFT) 25 MG tablet Take 1 tablet (25 mg total) by mouth daily. 06/18/20   Lenard Lance, FNP    Allergies    Colchicine, Lisinopril, Morphine, and Oxycodone hcl  Review of Systems   Review of Systems  Constitutional: Negative for chills and fever.  HENT: Negative for congestion and rhinorrhea.   Respiratory: Negative for cough and shortness of breath.   Cardiovascular: Negative for chest pain and palpitations.  Gastrointestinal: Negative for diarrhea, nausea and vomiting.  Genitourinary: Negative for difficulty urinating and dysuria.  Musculoskeletal: Negative for arthralgias and back pain.   Skin: Positive for wound. Negative for color change and rash.  Neurological: Negative for light-headedness and headaches.    Physical Exam Updated Vital Signs BP 131/80 (BP Location: Right Arm)   Pulse 76   Temp 98.1 F (36.7 C) (Oral)   Resp 18   Wt (!) 156.1 kg   SpO2 99%   BMI 43.01 kg/m   Physical Exam Vitals and nursing note reviewed. Exam conducted with a chaperone present.  Constitutional:      General: He is not in acute distress.    Appearance: Normal appearance.  HENT:     Head: Normocephalic and atraumatic.     Nose: No rhinorrhea.  Eyes:     General:        Right eye: No discharge.        Left eye: No discharge.     Conjunctiva/sclera: Conjunctivae normal.  Cardiovascular:     Rate and Rhythm: Normal rate and regular rhythm.  Pulmonary:     Effort: Pulmonary effort is normal.     Breath sounds: No stridor.  Abdominal:     General: Abdomen is flat. There is no distension.     Palpations: Abdomen is soft.  Genitourinary:    Comments: Fluctuant mass just left of the gluteal cleft anteriorly most of the gluteus region.  Mild bloody purulent drainage from a punctate hole.  Tender to palpation.  Rectal exam was without rectal tenderness or fullness. Musculoskeletal:        General: No deformity or signs of injury.  Skin:    General: Skin is warm and dry.  Neurological:     General: No focal deficit present.     Mental Status: He is alert. Mental status is at baseline.     Motor: No weakness.  Psychiatric:        Mood and Affect: Mood normal.        Behavior: Behavior normal.        Thought Content: Thought content normal.     ED Results / Procedures / Treatments   Labs (all labs ordered are listed, but only abnormal results are displayed) Labs Reviewed - No data to display  EKG None  Radiology No results found.  Procedures .Marland KitchenIncision and Drainage  Date/Time: 07/22/2020 2:35 PM Performed by: Sabino Donovan, MD Authorized by: Sabino Donovan, MD    Consent:    Consent obtained:  Verbal   Consent given by:  Patient   Risks discussed:  Bleeding, incomplete drainage, infection and pain   Alternatives discussed:  No treatment, delayed treatment and alternative treatment Location:    Type:  Abscess   Location:  Anogenital   Anogenital location:  Gluteal cleft Pre-procedure details:    Skin preparation:  Povidone-iodine Anesthesia:    Anesthesia method:  Local infiltration   Local anesthetic:  Lidocaine 1% WITH epi Procedure type:  Complexity:  Simple Procedure details:    Incision types:  Single straight   Incision depth:  Subcutaneous   Wound management:  Probed and deloculated and irrigated with saline   Drainage:  Serosanguinous and purulent   Drainage amount:  Moderate Post-procedure details:    Procedure completion:  Tolerated well, no immediate complications     Medications Ordered in ED Medications - No data to display  ED Course  I have reviewed the triage vital signs and the nursing notes.  Pertinent labs & imaging results that were available during my care of the patient were reviewed by me and considered in my medical decision making (see chart for details).    MDM Rules/Calculators/A&P                          Gluteal abscess anterior and left lateral to the anus.  No rectal or anal involvement.  Drained at bedside as described above.  Will send home on antibiotics outpatient follow-up recommended.  Return precautions discussed.   Final Clinical Impression(s) / ED Diagnoses Final diagnoses:  Abscess    Rx / DC Orders ED Discharge Orders         Ordered    sulfamethoxazole-trimethoprim (BACTRIM DS) 800-160 MG tablet  2 times daily        07/21/20 2244           Sabino Donovan, MD 07/22/20 1435

## 2020-07-21 NOTE — ED Notes (Signed)
Pt teaching done in regards to care of area that was I&D by ED MD. Provided client with ABD pads and mesh undergarment. Also educated client on the importance of taking and completing all the Abx as prescribed by provider. Opportunity for questions provided. States pain decrease after procedure, only c/o being sore like at procedural area.

## 2020-07-21 NOTE — Discharge Instructions (Signed)
Take the antibiotics as prescribed.  Use Tylenol Motrin for pain control as described below.  Follow-up with your primary care provider.  Try and do warm water soaks at least once a day.  Return to [place] with any concerning findings or changes.

## 2020-07-21 NOTE — ED Triage Notes (Signed)
Reports rectal "boil" noted over the last few days onset bleeding today. States blood when wiping today, noted bump near left  rectum.

## 2020-11-28 ENCOUNTER — Other Ambulatory Visit: Payer: Self-pay

## 2020-11-28 ENCOUNTER — Ambulatory Visit (HOSPITAL_COMMUNITY)
Admission: EM | Admit: 2020-11-28 | Discharge: 2020-11-28 | Disposition: A | Discharge status: Home / Self Care | Payer: Medicaid Other | Attending: Psychiatry | Admitting: Psychiatry

## 2020-11-28 DIAGNOSIS — F1120 Opioid dependence, uncomplicated: Secondary | ICD-10-CM | POA: Insufficient documentation

## 2020-11-28 DIAGNOSIS — F331 Major depressive disorder, recurrent, moderate: Secondary | ICD-10-CM

## 2020-11-28 DIAGNOSIS — Z818 Family history of other mental and behavioral disorders: Secondary | ICD-10-CM | POA: Insufficient documentation

## 2020-11-28 NOTE — Discharge Instructions (Addendum)

## 2020-11-28 NOTE — ED Notes (Signed)
Pt discharged home with AVS.  AVS reviewed prior to discharge.  Pt alert, oriented, and ambulatory.  Safety maintained.

## 2020-11-28 NOTE — ED Provider Notes (Signed)
Behavioral Health Urgent Care Medical Screening Exam  Patient Name: Brian Day MRN: [id number] Date of Evaluation: 11/28/20 Chief Complaint:   Diagnosis:  Final diagnoses:  MDD (major depressive disorder), recurrent episode, moderate (HCC)    History of Present illness: Brian Day is a 40 y.o. male.  Patient presents voluntarily to [facility] behavioral health for walk-in assessment. Patient states "I have been feeling depressed and I no longer have drugs to cover up my feelings."  He endorses anhedonia, increased tearfulness, and decreased energy times approximately 3 months.  He is insightful regarding diagnosis and states "I know that I do not want to start using drugs again."  [name] reports history of opioid use disorder, denies use of opioids times approximately 4 years.  He reports he is chronically managed on Zubsolv, medication assisted treatment, for opioid disorder prescribed by Laurence [facility] at [facility].  He reports he has a very supportive network, attends [employer] and has an NA sponsor with whom he speaks daily.  He recently discontinued use of marijuana 3 months ago.  He reports he feels that he used marijuana to "cover up my feelings."  He reports because he no longer uses marijuana he has also lost touch with friends who continue to use substances as he no longer spends time with those who use substances. He reports he is seeking outpatient psychiatric treatment, states "just talking like this is helping a lot." [name] has been diagnosed with major depressive disorder, ADHD and opiate dependence.  He denies any current psychotropic medications.  He is not currently followed by outpatient psychiatry or counseling.  He endorses family history of mental illness including his mother who has been diagnosed with depression and anxiety.  Additionally he reports his maternal grandmother and maternal grandfather have also been diagnosed with depression.  Patient is assessed  face-to-face by nurse practitioner. He is seated in assessment area, no acute distress.  He is alert and oriented, pleasant and cooperative during assessment.  He reports depressed mood with congruent affect.  He denies suicidal and homicidal ideations.  He denies any history of  self-harm behavior.  He contracts verbally for safety with this Clinical research associate.  He has normal speech and behavior.  He denies both auditory and visual hallucinations.  Patient is able to converse coherently with goal-directed thoughts and no distractibility or preoccupation.  He denies paranoia.  Objectively there is no evidence of psychosis/mania or delusional thinking.  Patient resides in [place], denies access to weapons.  He is employed in the [employer].  He endorses average sleep and appetite.  He denies alcohol and substance use.  Patient offered support and encouragement.  He denies any person to contact for collateral information at this time.  He reports his brother is his primary support and his brother is aware that Marsel is being seen for mental health needs today.   Psychiatric Specialty Exam  Presentation  General Appearance:Appropriate for Environment; Casual  Eye Contact:Good  Speech:Clear and Coherent; Normal Rate  Speech Volume:Normal  Handedness:Right   Mood and Affect  Mood:Depressed  Affect:Congruent; Depressed   Thought Process  Thought Processes:Coherent; Goal Directed; Linear  Descriptions of Associations:Intact  Orientation:Full (Time, Place and Person)  Thought Content:Logical; WDL  Diagnosis of Schizophrenia or Schizoaffective disorder in past: No   Hallucinations:None  Ideas of Reference:None  Suicidal Thoughts:No Without Intent; Without Plan  Homicidal Thoughts:No   Sensorium  Memory:Immediate Good; Recent Good; Remote Good  Judgment:Good  Insight:Good   Executive Functions  Concentration:Good  Attention Span:Good  Recall:Good  Fund of  Knowledge:Good  Language:Good   Psychomotor Activity  Psychomotor Activity:Normal   Assets  Assets:Communication Skills; Desire for Improvement; Financial Resources/Insurance; Intimacy; Housing; Leisure Time; Physical Health; Resilience; Social Support; Talents/Skills; Transportation; Vocational/Educational   Sleep  Sleep:Good  Number of hours: 3   No data recorded  Physical Exam: Physical Exam Vitals and nursing note reviewed.  Constitutional:      Appearance: Normal appearance. He is well-developed.  HENT:     Head: Normocephalic and atraumatic.     Nose: Nose normal.  Cardiovascular:     Rate and Rhythm: Normal rate.  Pulmonary:     Effort: Pulmonary effort is normal.  Musculoskeletal:        General: Normal range of motion.     Cervical back: Normal range of motion.  Skin:    General: Skin is warm and dry.  Neurological:     Mental Status: He is alert and oriented to person, place, and time.  Psychiatric:        Attention and Perception: Attention and perception normal.        Mood and Affect: Affect normal. Mood is depressed.        Speech: Speech normal.        Behavior: Behavior normal. Behavior is cooperative.        Thought Content: Thought content normal.        Cognition and Memory: Cognition and memory normal.        Judgment: Judgment normal.   Review of Systems  Constitutional: Negative.   HENT: Negative.    Eyes: Negative.   Respiratory: Negative.    Cardiovascular: Negative.   Gastrointestinal: Negative.   Genitourinary: Negative.   Musculoskeletal: Negative.   Skin: Negative.   Neurological: Negative.   Endo/Heme/Allergies: Negative.   Psychiatric/Behavioral:  Positive for depression.   Blood pressure (!) 145/62, pulse 83, temperature 98.4 F (36.9 C), temperature source Oral, resp. rate 18, SpO2 98 %. There is no height or weight on file to calculate BMI.  Musculoskeletal: Strength & Muscle Tone: within normal limits Gait & Station:  normal Patient leans: N/A   [facility] MSE Discharge Disposition for Follow up and Recommendations: Based on my evaluation the patient does not appear to have an emergency medical condition and can be discharged with resources and follow up care in outpatient services for Medication Management and Individual Therapy Patient reviewed with Dr. Bronwen Betters. Follow-up with outpatient psychiatry, resources provided.   Lenard Lance, FNP 11/28/2020, 12:50 PM

## 2020-11-29 ENCOUNTER — Ambulatory Visit (INDEPENDENT_AMBULATORY_CARE_PROVIDER_SITE_OTHER): Payer: Medicaid Other | Admitting: Physician Assistant

## 2020-11-29 ENCOUNTER — Encounter (HOSPITAL_COMMUNITY): Payer: Self-pay | Admitting: Physician Assistant

## 2020-11-29 VITALS — BP 143/83 | HR 79 | Ht 75.0 in | Wt 339.0 lb

## 2020-11-29 DIAGNOSIS — F331 Major depressive disorder, recurrent, moderate: Secondary | ICD-10-CM | POA: Diagnosis not present

## 2020-11-29 DIAGNOSIS — F1911 Other psychoactive substance abuse, in remission: Secondary | ICD-10-CM | POA: Insufficient documentation

## 2020-11-29 DIAGNOSIS — F909 Attention-deficit hyperactivity disorder, unspecified type: Secondary | ICD-10-CM | POA: Diagnosis not present

## 2020-11-29 DIAGNOSIS — F410 Panic disorder [episodic paroxysmal anxiety] without agoraphobia: Secondary | ICD-10-CM | POA: Insufficient documentation

## 2020-11-29 DIAGNOSIS — F411 Generalized anxiety disorder: Secondary | ICD-10-CM | POA: Diagnosis not present

## 2020-11-29 MED ORDER — BUSPIRONE HCL 7.5 MG PO TABS
7.5000 mg | ORAL_TABLET | Freq: Three times a day (TID) | ORAL | 1 refills | Status: DC
Start: 2020-11-29 — End: 2021-03-05

## 2020-11-29 MED ORDER — VORTIOXETINE HBR 5 MG PO TABS: 5.0000 mg | ORAL_TABLET | Freq: Every day | ORAL | 1 refills | Status: DC

## 2020-11-29 MED ORDER — ATOMOXETINE HCL 40 MG PO CAPS: 40.0000 mg | ORAL_CAPSULE | Freq: Every day | ORAL | 1 refills | Status: DC
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
## 2020-12-06 ENCOUNTER — Telehealth (HOSPITAL_COMMUNITY): Payer: Self-pay | Admitting: *Deleted

## 2020-12-06 NOTE — Telephone Encounter (Signed)
vortioxetine HBr (TRINTELLIX) 5 MG TABS tablet Original Claim Info [id number] 01QUANTITY EXCEEDS THE ADULT DOSAGE RECOMM+02ENDED BY THE FDA FOR ANTIDEPRESSANTS

## 2020-12-09 ENCOUNTER — Encounter (HOSPITAL_BASED_OUTPATIENT_CLINIC_OR_DEPARTMENT_OTHER): Payer: Self-pay | Admitting: Emergency Medicine

## 2020-12-09 ENCOUNTER — Emergency Department (HOSPITAL_BASED_OUTPATIENT_CLINIC_OR_DEPARTMENT_OTHER)
Admission: EM | Admit: 2020-12-09 | Discharge: 2020-12-09 | Disposition: A | Discharge status: Home / Self Care | Payer: Medicaid Other | Attending: Emergency Medicine | Admitting: Emergency Medicine

## 2020-12-09 ENCOUNTER — Emergency Department (HOSPITAL_BASED_OUTPATIENT_CLINIC_OR_DEPARTMENT_OTHER): Payer: Medicaid Other

## 2020-12-09 ENCOUNTER — Other Ambulatory Visit: Payer: Self-pay

## 2020-12-09 DIAGNOSIS — W010XXA Fall on same level from slipping, tripping and stumbling without subsequent striking against object, initial encounter: Secondary | ICD-10-CM | POA: Diagnosis not present

## 2020-12-09 DIAGNOSIS — E119 Type 2 diabetes mellitus without complications: Secondary | ICD-10-CM | POA: Diagnosis not present

## 2020-12-09 DIAGNOSIS — M25531 Pain in right wrist: Secondary | ICD-10-CM | POA: Insufficient documentation

## 2020-12-09 DIAGNOSIS — F17210 Nicotine dependence, cigarettes, uncomplicated: Secondary | ICD-10-CM | POA: Diagnosis not present

## 2020-12-09 DIAGNOSIS — S6991XA Unspecified injury of right wrist, hand and finger(s), initial encounter: Secondary | ICD-10-CM | POA: Diagnosis present

## 2020-12-09 DIAGNOSIS — I10 Essential (primary) hypertension: Secondary | ICD-10-CM | POA: Insufficient documentation

## 2020-12-09 DIAGNOSIS — S52591A Other fractures of lower end of right radius, initial encounter for closed fracture: Secondary | ICD-10-CM | POA: Diagnosis not present

## 2020-12-09 NOTE — ED Triage Notes (Signed)
Pt reports right wrist pain from mechanical fall that occurred earlier today. Pt has full ROM in shoulder and elbow.

## 2020-12-09 NOTE — ED Provider Notes (Signed)
[facility] Provider Note   CSN: [id number] Arrival date & time: 12/09/20  2055     History Chief Complaint  Patient presents with   Wrist Pain    Brian Day is a 40 y.o. male.  Patient is a 40 year old male with a history of diabetes and hypertension who is presenting today after he tripped and fell.  He was at the gas station and he was walking by the pump when he tripped and fell on an outstretched arm.  He has had pain in the right wrist since this occurred.  He denies any numbness or weakness in his fingers.  No pain in his elbow or shoulder.  He did not hit his head or lose consciousness.  This occurred approximately 2 hours prior to arrival.  Any movement makes the pain worse.  It is sharp in nature and occasionally will radiate up to the elbow.  He wrapped it up and immobilized it with some improvement but it continues to hurt.  The history is provided by the patient.  Wrist Pain      Past Medical History:  Diagnosis Date   Addiction, opium (HCC)    Anxiety    Arthritis    "right knee" (07/15/2016)   CAP (community acquired pneumonia) 05/2014   hx/notes 06/07/2014   Depression    GERD (gastroesophageal reflux disease)    Hypertension    Migraine    "once q 3-4 years" (07/15/2016)   Morbid obesity (HCC)    Type II diabetes mellitus (HCC)     Patient Active Problem List   Diagnosis Date Noted   Generalized anxiety disorder with panic attacks 11/29/2020   History of substance abuse (HCC) 11/29/2020   Severe recurrent major depression with psychotic features (HCC)    Diabetic foot infection (HCC) 01/15/2020   Opiate dependence, continuous (HCC) 01/15/2020   Attention deficit hyperactivity disorder (ADHD) 01/15/2020   Upper respiratory infection 01/23/2017   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 09/25/2016   Nonunion of foot fracture, right 08/28/2016   Moderate episode of recurrent major depressive  disorder (HCC) 08/19/2016   Idiopathic chronic gout of right ankle without tophus    Chronic pain in right foot 07/15/2016   Diabetic peripheral neuropathy (HCC) 12/29/2013   Benign essential hypertension 10/27/2013   Uncontrolled type 2 diabetes mellitus with complication, with long-term current use of insulin (HCC) 11/23/2012   Morbid obesity (HCC) 11/23/2012   Low back pain 11/23/2012    Past Surgical History:  Procedure Laterality Date   KNEE ARTHROSCOPY Right 2000s   LAPAROSCOPIC GASTRIC SLEEVE RESECTION  11/2018       Family History  Problem Relation Age of Onset   Diabetes Mother    Hypertension Mother    Hypertension Father     Social History   Tobacco Use   Smoking status: Every Day    Packs/day: 1.00    Years: 24.00    Pack years: 24.00    Types: Cigarettes   Smokeless tobacco: Former    Types: Snuff, Chew  Vaping Use   Vaping Use: Never used  Substance Use Topics   Alcohol use: No   Drug use: Not Currently    Types: Marijuana    Comment: smokes regularly    Home Medications Prior to Admission medications   Medication Sig Start Date End Date Taking? Authorizing Provider  atomoxetine (STRATTERA) 40 MG capsule Take 1 capsule (40 mg total) by mouth daily. 11/29/20 11/29/21  Nwoko, Uchenna E, PA  busPIRone (BUSPAR) 7.5 MG tablet Take 1 tablet (7.5 mg total) by mouth 3 (three) times daily. 11/29/20   Nwoko, Tommas Olp, PA  cyclobenzaprine (FLEXERIL) 10 MG tablet Take 10 mg by mouth 3 (three) times daily as needed for muscle spasms.    [provider]  dicyclomine (BENTYL) 20 MG tablet Take 1 tablet by mouth at bedtime as needed.    [provider]  hydrocortisone cream 1 % Apply to affected area 2 times daily 07/04/20   Claude Manges, PA-C  sertraline (ZOLOFT) 25 MG tablet Take 1 tablet (25 mg total) by mouth daily. 06/18/20   Lenard Lance, FNP  vortioxetine HBr (TRINTELLIX) 5 MG TABS tablet Take 1 tablet (5 mg total) by mouth daily. Patient to take  1 tablet (5 mg total) for 6 days, followed by 2 tablets (10 mg total) daily 11/29/20   Nwoko, Uchenna E, PA  ZUBSOLV 5.7-1.4 MG SUBL Take 1 tablet by mouth 2 (two) times daily as needed for pain. 01/09/20   [provider]    Allergies    Colchicine, Lisinopril, Morphine, and Oxycodone hcl  Review of Systems   Review of Systems  All other systems reviewed and are negative.  Physical Exam Updated Vital Signs BP (!) 150/95 (BP Location: Right Arm)   Pulse 91   Temp 98.4 F (36.9 C) (Oral)   Resp 18   Ht 6\' 3"  (1.905 m)   Wt (!) 152 kg   SpO2 100%   BMI 41.87 kg/m   Physical Exam Vitals and nursing note reviewed.  Constitutional:      General: He is not in acute distress.    Appearance: Normal appearance.  Eyes:     Pupils: Pupils are equal, round, and reactive to light.  Cardiovascular:     Pulses: Normal pulses.  Pulmonary:     Effort: Pulmonary effort is normal.  Musculoskeletal:        General: Tenderness present.     Right shoulder: Normal.     Right elbow: Normal.     Right wrist: Swelling, tenderness and bony tenderness present. No snuff box tenderness. Decreased range of motion. Normal pulse.  Skin:    General: Skin is warm and dry.  Neurological:     Mental Status: He is alert and oriented to person, place, and time. Mental status is at baseline.  Psychiatric:        Mood and Affect: Mood normal.        Behavior: Behavior normal.    ED Results / Procedures / Treatments   Labs (all labs ordered are listed, but only abnormal results are displayed) Labs Reviewed - No data to display  EKG None  Radiology DG Wrist Complete Right  Result Date: 12/09/2020 CLINICAL DATA:  Fall on outstretched hand, right wrist pain EXAM: RIGHT WRIST - COMPLETE 3+ VIEW COMPARISON:  None. FINDINGS: Four view radiograph right wrist demonstrates a mild dorsal buckle fracture of the distal right radial metaphyseal region, best seen on lateral examination. Normal overall  alignment. No dislocation. Joint spaces are preserved. Soft tissues are unremarkable. IMPRESSION: Normal aligned dorsal buckle fracture of the distal right radial Electronically Signed   By: 12/11/2020 M.D.   On: 12/09/2020 21:44    Procedures Procedures   Medications Ordered in ED Medications - No data to display  ED Course  I have reviewed the triage vital signs and the nursing notes.  Pertinent labs & imaging results that  were available during my care of the patient were reviewed by me and considered in my medical decision making (see chart for details).    MDM Rules/Calculators/A&P                           Patient presenting today with ongoing pain in his right wrist after he fell on an outstretched arm.  No elbow or shoulder pain.  X-ray shows a dorsal buckle fracture of the distal right radius.  No other acute fractures identified.  Patient placed in a splint and given follow-up with sports medicine.  Instructed to not do any lifting with that arm.  MDM   Amount and/or Complexity of Data Reviewed Tests in the radiology section of CPT: ordered and reviewed Independent visualization of images, tracings, or specimens: yes    Final Clinical Impression(s) / ED Diagnoses Final diagnoses:  Other closed fracture of distal end of right radius, initial encounter    Rx / DC Orders ED Discharge Orders     None        Gwyneth Sprout, MD 12/09/20 2244

## 2020-12-12 ENCOUNTER — Ambulatory Visit (INDEPENDENT_AMBULATORY_CARE_PROVIDER_SITE_OTHER): Payer: Medicaid Other | Admitting: Family Medicine

## 2020-12-12 ENCOUNTER — Encounter: Payer: Self-pay | Admitting: Family Medicine

## 2020-12-12 ENCOUNTER — Other Ambulatory Visit: Payer: Self-pay

## 2020-12-12 VITALS — Ht 75.0 in | Wt 335.0 lb

## 2020-12-12 DIAGNOSIS — S52551A Other extraarticular fracture of lower end of right radius, initial encounter for closed fracture: Secondary | ICD-10-CM

## 2020-12-12 DIAGNOSIS — S52501A Unspecified fracture of the lower end of right radius, initial encounter for closed fracture: Secondary | ICD-10-CM | POA: Insufficient documentation

## 2020-12-12 NOTE — Progress Notes (Signed)
  Brian Day - 40 y.o. male MRN [id number]  Date of birth: Oct 09, 1980  SUBJECTIVE:  Including CC & ROS.  No chief complaint on file.   Brian Day is a 40 y.o. male that is presenting with acute right wrist pain.  He had a fall a few days ago at a gas station.  He was found to have a fracture in the emergency department.  He was placed in a radial splint.  Having swelling in his fingers and increasing ecchymosis.   Review of Systems See HPI   HISTORY: Past Medical, Surgical, Social, and Family History Reviewed & Updated per EMR.   Pertinent Historical Findings include:  Past Medical History:  Diagnosis Date   Addiction, opium (HCC)    Anxiety    Arthritis    "right knee" (07/15/2016)   CAP (community acquired pneumonia) 05/2014   hx/notes 06/07/2014   Depression    GERD (gastroesophageal reflux disease)    Hypertension    Migraine    "once q 3-4 years" (07/15/2016)   Morbid obesity (HCC)    Type II diabetes mellitus (HCC)     Past Surgical History:  Procedure Laterality Date   KNEE ARTHROSCOPY Right 2000s   LAPAROSCOPIC GASTRIC SLEEVE RESECTION  11/2018    Family History  Problem Relation Age of Onset   Diabetes Mother    Hypertension Mother    Hypertension Father     Social History   Socioeconomic History   Marital status: Single    Spouse name: Not on file   Number of children: Not on file   Years of education: Not on file   Highest education level: Not on file  Occupational History   Occupation: courier  Tobacco Use   Smoking status: Every Day    Packs/day: 1.00    Years: 24.00    Pack years: 24.00    Types: Cigarettes   Smokeless tobacco: Former    Types: Snuff, Designer, multimedia Use   Vaping Use: Never used  Substance and Sexual Activity   Alcohol use: No   Drug use: Not Currently    Types: Marijuana    Comment: smokes regularly   Sexual activity: Never  Other Topics Concern   Not on file  Social History Narrative   Not on file   Social  Determinants of Health   Financial Resource Strain: Not on file  Food Insecurity: Not on file  Transportation Needs: Not on file  Physical Activity: Not on file  Stress: Not on file  Social Connections: Not on file  Intimate Partner Violence: Not on file     PHYSICAL EXAM:  VS: Ht 6\' 3"  (1.905 m)   Wt (!) 335 lb (152 kg)   BMI 41.87 kg/m  Physical Exam Gen: NAD, alert, cooperative with exam, well-appearing   1. Wrist/hand  2. Right 3. clamshell 4. Ortho-glass 5. Applied by Dr.      ASSESSMENT & PLAN:   Closed fracture of right distal radius Initial injury on 9/18.  Has concern for fracture of the distal radius. -Counseled on home exercise therapy and supportive care. -Splint today. -Follow-up in 1 week to reimage and to place in a cast.

## 2020-12-12 NOTE — Assessment & Plan Note (Signed)
Initial injury on 9/18.  Has concern for fracture of the distal radius. -Counseled on home exercise therapy and supportive care. -Splint today. -Follow-up in 1 week to reimage and to place in a cast.

## 2020-12-12 NOTE — Patient Instructions (Signed)
Nice to meet you Please use ice as needed  Please continue the splint  Please send me a message in MyChart with any questions or updates.  Please see me back in 1 week.   --Dr. Kastiel Simonian  

## 2020-12-19 ENCOUNTER — Ambulatory Visit (HOSPITAL_BASED_OUTPATIENT_CLINIC_OR_DEPARTMENT_OTHER)
Admission: RE | Admit: 2020-12-19 | Discharge: 2020-12-19 | Disposition: A | Discharge status: Home / Self Care | Payer: Medicaid Other | Source: Ambulatory Visit | Attending: Family Medicine | Admitting: Family Medicine

## 2020-12-19 ENCOUNTER — Other Ambulatory Visit: Payer: Self-pay

## 2020-12-19 ENCOUNTER — Ambulatory Visit (INDEPENDENT_AMBULATORY_CARE_PROVIDER_SITE_OTHER): Payer: Medicaid Other | Admitting: Family Medicine

## 2020-12-19 ENCOUNTER — Encounter: Payer: Self-pay | Admitting: Family Medicine

## 2020-12-19 DIAGNOSIS — S52551D Other extraarticular fracture of lower end of right radius, subsequent encounter for closed fracture with routine healing: Secondary | ICD-10-CM | POA: Diagnosis not present

## 2020-12-19 NOTE — Assessment & Plan Note (Addendum)
Initial injury on 9/18.  Independent review of the x-ray from today shows a cortical change in the distal radius that is still present.  No malrotation or misalignment. -Counseled on home exercise therapy and supportive care. -Transition from splint into exos today. -Follow-up in 2 weeks.

## 2020-12-19 NOTE — Patient Instructions (Signed)
Good to see you  Please continue the cast  Please send me a message in MyChart with any questions or updates.  Please see me back in 2 weeks.   --Dr. Jordan Likes

## 2020-12-19 NOTE — Progress Notes (Signed)
  Brian Day - 40 y.o. male MRN [id number]  Date of birth: [birth year]  SUBJECTIVE:  Including CC & ROS.  No chief complaint on file.   Brian Day is a 40 y.o. male that is following up for his left wrist fracture.  Having some pain in the dorsal aspect.   Review of Systems See HPI   HISTORY: Past Medical, Surgical, Social, and Family History Reviewed & Updated per EMR.   Pertinent Historical Findings include:  Past Medical History:  Diagnosis Date   Addiction, opium (HCC)    Anxiety    Arthritis    "right knee" (07/15/2016)   CAP (community acquired pneumonia) 05/2014   hx/notes 06/07/2014   Depression    GERD (gastroesophageal reflux disease)    Hypertension    Migraine    "once q 3-4 years" (07/15/2016)   Morbid obesity (HCC)    Type II diabetes mellitus (HCC)     Past Surgical History:  Procedure Laterality Date   KNEE ARTHROSCOPY Right 2000s   LAPAROSCOPIC GASTRIC SLEEVE RESECTION  11/2018    Family History  Problem Relation Age of Onset   Diabetes Mother    Hypertension Mother    Hypertension Father     Social History   Socioeconomic History   Marital status: Single    Spouse name: Not on file   Number of children: Not on file   Years of education: Not on file   Highest education level: Not on file  Occupational History   Occupation: courier  Tobacco Use   Smoking status: Every Day    Packs/day: 1.00    Years: 24.00    Pack years: 24.00    Types: Cigarettes   Smokeless tobacco: Former    Types: Snuff, Designer, multimedia Use   Vaping Use: Never used  Substance and Sexual Activity   Alcohol use: No   Drug use: Not Currently    Types: Marijuana    Comment: smokes regularly   Sexual activity: Never  Other Topics Concern   Not on file  Social History Narrative   Not on file   Social Determinants of Health   Financial Resource Strain: Not on file  Food Insecurity: Not on file  Transportation Needs: Not on file  Physical Activity: Not on  file  Stress: Not on file  Social Connections: Not on file  Intimate Partner Violence: Not on file     PHYSICAL EXAM:  VS: Ht 6\' 3"  (1.905 m)   Wt (!) 335 lb (152 kg)   BMI 41.87 kg/m  Physical Exam Gen: NAD, alert, cooperative with exam, well-appearing      ASSESSMENT & PLAN:   Closed fracture of right distal radius Initial injury on 9/18.  Independent review of the x-ray from today shows a cortical change in the distal radius that is still present.  No malrotation or misalignment. -Counseled on home exercise therapy and supportive care. -Transition from splint into exos today. -Follow-up in 2 weeks.

## 2020-12-21 ENCOUNTER — Telehealth: Payer: Self-pay | Admitting: Family Medicine

## 2020-12-21 NOTE — Telephone Encounter (Signed)
Informed of results.   Myra Rude, MD [facility] 12/21/2020, 8:59 AM

## 2021-01-01 ENCOUNTER — Other Ambulatory Visit (HOSPITAL_COMMUNITY): Payer: Self-pay | Admitting: Physician Assistant

## 2021-01-01 DIAGNOSIS — F909 Attention-deficit hyperactivity disorder, unspecified type: Secondary | ICD-10-CM

## 2021-01-02 ENCOUNTER — Ambulatory Visit: Payer: Medicaid Other | Admitting: Family Medicine

## 2021-01-02 NOTE — Progress Notes (Deleted)
  Brian Day - 40 y.o. male MRN [id number]  Date of birth: 30-Sep-1980  SUBJECTIVE:  Including CC & ROS.  No chief complaint on file.   Brian Day is a 40 y.o. male that is  ***.  ***   Review of Systems See HPI   HISTORY: Past Medical, Surgical, Social, and Family History Reviewed & Updated per EMR.   Pertinent Historical Findings include:  Past Medical History:  Diagnosis Date   Addiction, opium (HCC)    Anxiety    Arthritis    "right knee" (07/15/2016)   CAP (community acquired pneumonia) 05/2014   hx/notes 06/07/2014   Depression    GERD (gastroesophageal reflux disease)    Hypertension    Migraine    "once q 3-4 years" (07/15/2016)   Morbid obesity (HCC)    Type II diabetes mellitus (HCC)     Past Surgical History:  Procedure Laterality Date   KNEE ARTHROSCOPY Right 2000s   LAPAROSCOPIC GASTRIC SLEEVE RESECTION  11/2018    Family History  Problem Relation Age of Onset   Diabetes Mother    Hypertension Mother    Hypertension Father     Social History   Socioeconomic History   Marital status: Single    Spouse name: Not on file   Number of children: Not on file   Years of education: Not on file   Highest education level: Not on file  Occupational History   Occupation: courier  Tobacco Use   Smoking status: Every Day    Packs/day: 1.00    Years: 24.00    Pack years: 24.00    Types: Cigarettes   Smokeless tobacco: Former    Types: Snuff, Designer, multimedia Use   Vaping Use: Never used  Substance and Sexual Activity   Alcohol use: No   Drug use: Not Currently    Types: Marijuana    Comment: smokes regularly   Sexual activity: Never  Other Topics Concern   Not on file  Social History Narrative   Not on file   Social Determinants of Health   Financial Resource Strain: Not on file  Food Insecurity: Not on file  Transportation Needs: Not on file  Physical Activity: Not on file  Stress: Not on file  Social Connections: Not on file   Intimate Partner Violence: Not on file     PHYSICAL EXAM:  VS: There were no vitals taken for this visit. Physical Exam Gen: NAD, alert, cooperative with exam, well-appearing MSK:  ***      ASSESSMENT & PLAN:   No problem-specific Assessment & Plan notes found for this encounter.

## 2021-01-11 ENCOUNTER — Encounter (HOSPITAL_COMMUNITY): Payer: Medicaid Other | Admitting: Physician Assistant

## 2021-01-11 ENCOUNTER — Other Ambulatory Visit (HOSPITAL_COMMUNITY): Payer: Self-pay | Admitting: Physician Assistant

## 2021-01-11 DIAGNOSIS — F410 Panic disorder [episodic paroxysmal anxiety] without agoraphobia: Secondary | ICD-10-CM

## 2021-01-11 DIAGNOSIS — F331 Major depressive disorder, recurrent, moderate: Secondary | ICD-10-CM

## 2021-01-11 MED ORDER — VORTIOXETINE HBR 5 MG PO TABS
5.0000 mg | ORAL_TABLET | Freq: Every day | ORAL | 0 refills | Status: DC
Start: 2021-01-11 — End: 2021-04-12

## 2021-01-11 NOTE — Progress Notes (Signed)
Provider spoke with patient to discuss medication management. Patient informed provider that medication was unable to be sent due to refusal of request by insurance. Provider informed patient that medication may have been rejected due to medication instruction/scheduling. Provider informed patient that medication will be resent. Patient's appointment to be rescheduled at a time most convenient to the patient.

## 2021-01-11 NOTE — Telephone Encounter (Signed)
Provider was contacted by Rushie Chestnut regarding medication rejection by [employer]. Provider resent medication with revised instructions. Medication sent to preferred pharmacy.

## 2021-01-17 ENCOUNTER — Ambulatory Visit (HOSPITAL_COMMUNITY): Payer: Medicaid Other | Admitting: Licensed Clinical Social Worker

## 2021-02-01 ENCOUNTER — Encounter (HOSPITAL_COMMUNITY): Payer: Medicaid Other | Admitting: Physician Assistant

## 2021-03-05 ENCOUNTER — Other Ambulatory Visit: Payer: Self-pay

## 2021-03-05 ENCOUNTER — Other Ambulatory Visit (HOSPITAL_COMMUNITY): Payer: Self-pay | Admitting: Physician Assistant

## 2021-03-05 ENCOUNTER — Encounter (HOSPITAL_COMMUNITY): Payer: Self-pay | Admitting: Physician Assistant

## 2021-03-05 ENCOUNTER — Telehealth (INDEPENDENT_AMBULATORY_CARE_PROVIDER_SITE_OTHER): Payer: Medicaid Other | Admitting: Physician Assistant

## 2021-03-05 ENCOUNTER — Ambulatory Visit (INDEPENDENT_AMBULATORY_CARE_PROVIDER_SITE_OTHER): Payer: Medicaid Other | Admitting: Clinical

## 2021-03-05 DIAGNOSIS — F331 Major depressive disorder, recurrent, moderate: Secondary | ICD-10-CM

## 2021-03-05 DIAGNOSIS — F410 Panic disorder [episodic paroxysmal anxiety] without agoraphobia: Secondary | ICD-10-CM | POA: Diagnosis not present

## 2021-03-05 DIAGNOSIS — F411 Generalized anxiety disorder: Secondary | ICD-10-CM | POA: Diagnosis not present

## 2021-03-05 DIAGNOSIS — F1221 Cannabis dependence, in remission: Secondary | ICD-10-CM

## 2021-03-05 DIAGNOSIS — F909 Attention-deficit hyperactivity disorder, unspecified type: Secondary | ICD-10-CM

## 2021-03-05 DIAGNOSIS — F1121 Opioid dependence, in remission: Secondary | ICD-10-CM

## 2021-03-05 MED ORDER — ATOMOXETINE HCL 40 MG PO CAPS: ORAL_CAPSULE | ORAL | 1 refills | Status: DC

## 2021-03-05 MED ORDER — BUSPIRONE HCL 10 MG PO TABS: 10.0000 mg | ORAL_TABLET | Freq: Three times a day (TID) | ORAL | 1 refills | Status: DC

## 2021-03-05 NOTE — Progress Notes (Signed)
BH MD/PA/NP OP Progress Note  Virtual Visit via Telephone Note  I connected with Brian Day on 03/05/21 at  2:00 PM EST by telephone and verified that I am speaking with the correct person using two identifiers.  Location: Patient: Home Provider: Clinic   I discussed the limitations, risks, security and privacy concerns of performing an evaluation and management service by telephone and the availability of in person appointments. I also discussed with the patient that there may be a patient responsible charge related to this service. The patient expressed understanding and agreed to proceed.  Follow Up Instructions:  I discussed the assessment and treatment plan with the patient. The patient was provided an opportunity to ask questions and all were answered. The patient agreed with the plan and demonstrated an understanding of the instructions.   The patient was advised to call back or seek an in-person evaluation if the symptoms worsen or if the condition fails to improve as anticipated.  I provided 18 minutes of non-face-to-face time during this encounter.  Meta Hatchet, PA    03/05/2021 10:12 PM Brian Day  MRN:  [id number]  Chief Complaint: Follow up and medication management  HPI:   Brian Day is a 40 year old male with a past psychiatric history significant for major depressive disorder, generalized anxiety disorder, attention deficit hyperactivity disorder, panic disorder who presents to [facility] via virtual telephone visit for follow-up and medication management.  Patient is currently being managed on the following medications:  Buspirone 7.5 mg 2 times daily Strattera 40 mg daily Trintellix 5 mg daily  Patient reports that his Wilhemena Durie has been helpful in the management of his focus.  Patient also reports that his buspirone was working well in the management of his anxiety at first, but notes that his anxiety  has worsened over time.  He denies full panic attacks but states that when his anxiety gets high, he is prone to getting angry.  Patient's stressors include struggling with platonic relationships as well as intimate relationships.  Patient states that he continues to do his step work but states that whenever he talks with his sponsor about issues regarding his mental health, his sponsor encourages him to talk about his mental health issues with his therapist.  Patient states that he has recently started therapy and is hopeful that his therapy sessions will help with managing his anxiety.    Patient states that he has noticed some depression and is unsure if his anxiety is influencing his depressive episodes.  He states that he has not been able to receive his Trintellix because his pharmacy has not dispense the medication out to him.  Patient was informed that a prescription was sent to his preferred pharmacy.  Patient was advised to inform facility if he is unable to receive his Trintellix.  A PHQ-9 screen was performed with the patient scoring a 9.  A GAD-7 screen was also performed with the patient scoring a 19.  Patient is alert and oriented x4, calm, cooperative, and fully engaged in conversation during the encounter.  Patient states that he feels like self isolating more and would like to be alone.  He reports that he feels like he does not feel understood by full in his life.  Patient denies suicidal or homicidal ideations.  He further denies auditory or visual hallucinations and does not appear to be responding to internal/external stimuli.  Patient endorses fair sleep stating that there are days where he stays up  way too late.  He states that he often finds himself being late to things due to not getting enough sleep.  He reports receiving on average 6 to 7 hours of sleep each night.  Patient endorses decreased appetite and states that he occasionally binge eats before bedtime.  Patient eats on average  2 meals per day.  Patient denies alcohol consumption and illicit drug use.  Patient endorses tobacco use stating that she smokes on average 3 to 4 cigarettes/day.  Patient also engages in vaping everyday.  Visit Diagnosis:    ICD-10-CM   1. Attention deficit hyperactivity disorder (ADHD), unspecified ADHD type  F90.9 atomoxetine (STRATTERA) 40 MG capsule    2. Generalized anxiety disorder with panic attacks  F41.1 busPIRone (BUSPAR) 10 MG tablet   F41.0       Past Psychiatric History:  Major depressive disorder Generalized anxiety disorder Panic disorder Attention deficit hyperactivity disorder (unspecified type)  Past Medical History:  Past Medical History:  Diagnosis Date   Addiction, opium (HCC)    Anxiety    Arthritis    "right knee" (07/15/2016)   CAP (community acquired pneumonia) 05/2014   hx/notes 06/07/2014   Depression    GERD (gastroesophageal reflux disease)    Hypertension    Migraine    "once q 3-4 years" (07/15/2016)   Morbid obesity (HCC)    Type II diabetes mellitus (HCC)     Past Surgical History:  Procedure Laterality Date   KNEE ARTHROSCOPY Right 2000s   LAPAROSCOPIC GASTRIC SLEEVE RESECTION  11/2018    Family Psychiatric History:  Patient reports that he has multitude of psychiatric illnesses that run on both sides of his family. He reports that there have been several suicides. The following psychiatric illness run in his families: Anxiety and ADD/ADHD. Patient states that his uncle also suffered from substance abuse.  Family History:  Family History  Problem Relation Age of Onset   Diabetes Mother    Hypertension Mother    Hypertension Father     Social History:  Social History   Socioeconomic History   Marital status: Single    Spouse name: Not on file   Number of children: Not on file   Years of education: Not on file   Highest education level: Not on file  Occupational History   Occupation: courier  Tobacco Use   Smoking status:  Every Day    Packs/day: 1.00    Years: 24.00    Pack years: 24.00    Types: Cigarettes   Smokeless tobacco: Former    Types: Snuff, Designer, multimedia Use   Vaping Use: Never used  Substance and Sexual Activity   Alcohol use: No   Drug use: Not Currently    Types: Marijuana    Comment: smokes regularly   Sexual activity: Never  Other Topics Concern   Not on file  Social History Narrative   Not on file   Social Determinants of Health   Financial Resource Strain: Not on file  Food Insecurity: Not on file  Transportation Needs: Not on file  Physical Activity: Not on file  Stress: Not on file  Social Connections: Not on file    Allergies:  Allergies  Allergen Reactions   Colchicine Other (See Comments)    Worse HA ever had, vomiting, left side pain "it about killed me"   Lisinopril Other (See Comments) and Cough    Made throat dry also   Morphine Itching    States this is not  an allergy - 07-21-20   Oxycodone Hcl     States this is not an allergy - 07-21-20    Metabolic Disorder Labs: Lab Results  Component Value Date   HGBA1C 5.7 (H) 06/16/2020   MPG 116.89 06/16/2020   MPG 117 01/15/2020   Lab Results  Component Value Date   PROLACTIN 9.5 06/16/2020   Lab Results  Component Value Date   CHOL 188 06/16/2020   TRIG 270 (H) 06/16/2020   HDL 37 (L) 06/16/2020   CHOLHDL 5.1 06/16/2020   VLDL 54 (H) 06/16/2020   LDLCALC 97 06/16/2020   Lab Results  Component Value Date   TSH 2.003 06/16/2020   TSH 2.390 11/23/2012    Therapeutic Level Labs: No results found for: LITHIUM No results found for: VALPROATE No components found for:  CBMZ  Current Medications: Current Outpatient Medications  Medication Sig Dispense Refill   atomoxetine (STRATTERA) 40 MG capsule TAKE 1 CAPSULE BY MOUTH EVERY DAY 30 capsule 1   busPIRone (BUSPAR) 10 MG tablet Take 1 tablet (10 mg total) by mouth 3 (three) times daily. 90 tablet 1   cyclobenzaprine (FLEXERIL) 10 MG tablet Take 10  mg by mouth 3 (three) times daily as needed for muscle spasms.     dicyclomine (BENTYL) 20 MG tablet Take 1 tablet by mouth at bedtime as needed.     hydrocortisone cream 1 % Apply to affected area 2 times daily 15 g 0   sertraline (ZOLOFT) 25 MG tablet Take 1 tablet (25 mg total) by mouth daily. 30 tablet 0   vortioxetine HBr (TRINTELLIX) 5 MG TABS tablet Take 1 tablet (5 mg total) by mouth daily. 30 tablet 0   ZUBSOLV 5.7-1.4 MG SUBL Take 1 tablet by mouth 2 (two) times daily as needed for pain.     No current facility-administered medications for this visit.     Musculoskeletal: Strength & Muscle Tone: Unable to assess due to telemedicine visit Gait & Station: Unable to assess due to telemedicine visit Patient leans: Unable to assess due to telemedicine visit  Psychiatric Specialty Exam: Review of Systems  Psychiatric/Behavioral:  Negative for decreased concentration, dysphoric mood, hallucinations, self-injury, sleep disturbance and suicidal ideas. The patient is nervous/anxious. The patient is not hyperactive.    There were no vitals taken for this visit.There is no height or weight on file to calculate BMI.  General Appearance: Unable to assess due to telemedicine visit  Eye Contact:  Unable to assess due to telemedicine visit  Speech:  Clear and Coherent and Normal Rate  Volume:  Normal  Mood:  Anxious and Depressed  Affect:  Congruent  Thought Process:  Coherent, Goal Directed, and Descriptions of Associations: Intact  Orientation:  Full (Time, Place, and Person)  Thought Content: WDL   Suicidal Thoughts:  No  Homicidal Thoughts:  No  Memory:  Immediate;   Good Recent;   Good Remote;   Fair  Judgement:  Good  Insight:  Fair  Psychomotor Activity:  Normal  Concentration:  Concentration: Good and Attention Span: Good  Recall:  [employer] of Knowledge: Fair  Language: Good  Akathisia:  NA  Handed:  Right  AIMS (if indicated): not done  Assets:  Communication  Skills Desire for Improvement Housing Social Support Transportation Vocational/Educational  ADL's:  Intact  Cognition: WNL  Sleep:  Fair   Screenings: GAD-7    Flowsheet Row Video Visit from 03/05/2021 in [facility] Office Visit from 11/29/2020 in [place]  [facility] Office Visit from 09/23/2016 in [facility]  Total GAD-7 Score PHQ2-9    Flowsheet Row Video Visit from 03/05/2021 in [facility] Most recent reading at 03/05/2021  2:13 PM Counselor from 03/05/2021 in [facility] Most recent reading at 03/05/2021  9:33 AM Office Visit from 11/29/2020 in [facility] Most recent reading at 11/29/2020  8:35 AM ED from 11/28/2020 in [facility] Most recent reading at 11/28/2020 12:05 PM Office Visit from 02/16/2017 in [facility] Most recent reading at 02/16/2017  3:30 PM  PHQ-2 Total Score PHQ-9 Total Score Flowsheet Row Video Visit from 03/05/2021 in [facility] ED from 12/09/2020 in [facility] Office Visit from 11/29/2020 in [facility]  C-SSRS RISK CATEGORY No Risk No Risk Low Risk        Assessment and Plan:   Yitzchak Kothari is a 40 year old male with a past psychiatric history significant for major depressive disorder, generalized anxiety disorder, attention deficit hyperactivity disorder, panic disorder who presents to [facility] via virtual telephone visit for follow-up and medication management.  He reports that he has not received his Trintellix from his pharmacy.  Patient was informed to let the facility know if he is still unable to receive his Trintellix.  Patient endorses worsening anxiety over the past  few weeks.  He reports that his buspirone was helpful in the beginning but appears to be less effective now.  Provider recommended adjusting his buspirone dosage from 7.5 mg 3 times daily to 10 mg 3 times daily.  Patient was agreeable to recommendation.  Patient's medications to be e-prescribed to pharmacy of choice.  1. Attention deficit hyperactivity disorder (ADHD), unspecified ADHD type  - atomoxetine (STRATTERA) 40 MG capsule; TAKE 1 CAPSULE BY MOUTH EVERY DAY  Dispense: 30 capsule; Refill: 1  2. Generalized anxiety disorder with panic attacks  - busPIRone (BUSPAR) 10 MG tablet; Take 1 tablet (10 mg total) by mouth 3 (three) times daily.  Dispense: 90 tablet; Refill: 1  3. Moderate episode of recurrent major depressive disorder (HCC) Patient was prescribed vortioxetine 5 mg for the management of his major depressive disorder. Patient has failed on sertraline.  Patient to follow up in 6 weeks Provider spent a total of 18 minutes with the patient/reviewing the patient's chart  Meta Hatchet, PA 03/05/2021, 10:12 PM
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
## 2021-03-13 DIAGNOSIS — F331 Major depressive disorder, recurrent, moderate: Secondary | ICD-10-CM | POA: Insufficient documentation

## 2021-04-12 ENCOUNTER — Telehealth (INDEPENDENT_AMBULATORY_CARE_PROVIDER_SITE_OTHER): Payer: Medicaid Other | Admitting: Physician Assistant

## 2021-04-12 ENCOUNTER — Encounter (HOSPITAL_COMMUNITY): Payer: Self-pay | Admitting: Physician Assistant

## 2021-04-12 DIAGNOSIS — F331 Major depressive disorder, recurrent, moderate: Secondary | ICD-10-CM | POA: Diagnosis not present

## 2021-04-12 DIAGNOSIS — F410 Panic disorder [episodic paroxysmal anxiety] without agoraphobia: Secondary | ICD-10-CM

## 2021-04-12 DIAGNOSIS — F411 Generalized anxiety disorder: Secondary | ICD-10-CM

## 2021-04-12 DIAGNOSIS — F909 Attention-deficit hyperactivity disorder, unspecified type: Secondary | ICD-10-CM | POA: Diagnosis not present

## 2021-04-12 MED ORDER — ATOMOXETINE HCL 40 MG PO CAPS: ORAL_CAPSULE | ORAL | 1 refills | Status: DC

## 2021-04-12 MED ORDER — BUSPIRONE HCL 10 MG PO TABS: 10.0000 mg | ORAL_TABLET | Freq: Three times a day (TID) | ORAL | 1 refills | Status: DC

## 2021-04-12 MED ORDER — VORTIOXETINE HBR 5 MG PO TABS: 5.0000 mg | ORAL_TABLET | Freq: Every day | ORAL | 1 refills | Status: DC

## 2021-04-12 NOTE — Progress Notes (Signed)
BH MD/PA/NP OP Progress Note  Virtual Visit via Telephone Note  I connected with Brian Day on 04/12/21 at  3:00 PM EST by telephone and verified that I am speaking with the correct person using two identifiers.  Location: Patient: Home Provider: Clinic   I discussed the limitations, risks, security and privacy concerns of performing an evaluation and management service by telephone and the availability of in person appointments. I also discussed with the patient that there may be a patient responsible charge related to this service. The patient expressed understanding and agreed to proceed.  Follow Up Instructions:  I discussed the assessment and treatment plan with the patient. The patient was provided an opportunity to ask questions and all were answered. The patient agreed with the plan and demonstrated an understanding of the instructions.   The patient was advised to call back or seek an in-person evaluation if the symptoms worsen or if the condition fails to improve as anticipated.  I provided 13 minutes of non-face-to-face time during this encounter.  Meta Hatchet, PA   04/12/2021 9:54 PM Brian Day  MRN:  [id number]  Chief Complaint: Follow up and medication management  HPI:   Brian Day is a 41 year old male with a past psychiatric history significant for attention deficit hyperactivity disorder, major depressive disorder, generalized anxiety disorder and panic disorder who presents to [facility] via virtual telephone visit for follow-up and medication management.  Patient is currently being managed on the following medications:  Strattera 40 mg daily Trintellix 5 mg daily Buspirone 10 mg 2 times daily  Patient reports that his mood has been good as of late.  He states that he has started going to the gym more often and is doing his best to take care of himself.  Patient states that his depressive episodes have  been minimal and that he may experience 1 or 2 episodes per week.  Patient states that anytime he does have a shift in his mood it is mostly situational in nature.  Patient states that his anxiety has also been minimal and rates his anxiety a 2 or 3 out of 10.  Patient denies any new stressors at this time.  A PHQ-9 screen was performed with the patient scoring a 9.  A GAD-7 screen was also performed with the patient scoring a 14.  Patient is alert and oriented x4, calm, cooperative, and fully engaged in conversation during the encounter.  Patient endorses good mood.  Patient denies suicidal or homicidal ideations.  He further denies auditory or visual hallucinations and does not appear to be responding to internal/external stimuli.  Patient endorses good sleep and receives on average 6 to 8 hours of sleep each night.  Patient endorses good appetite and eats on average 2-3 meals per day.  Patient denies alcohol consumption and illicit drug use.  Patient endorses tobacco use and smokes on average 2 to 3 cigarettes/week also engages in vaping.  Visit Diagnosis:    ICD-10-CM   1. Attention deficit hyperactivity disorder (ADHD), unspecified ADHD type  F90.9 atomoxetine (STRATTERA) 40 MG capsule    2. Moderate episode of recurrent major depressive disorder (HCC)  F33.1 vortioxetine HBr (TRINTELLIX) 5 MG TABS tablet    3. Generalized anxiety disorder with panic attacks  F41.1 vortioxetine HBr (TRINTELLIX) 5 MG TABS tablet   F41.0 busPIRone (BUSPAR) 10 MG tablet      Past Psychiatric History:  Major depressive disorder Generalized anxiety disorder Panic disorder Attention deficit  hyperactivity disorder (unspecified type)  Past Medical History:  Past Medical History:  Diagnosis Date   Addiction, opium (HCC)    Anxiety    Arthritis    "right knee" (07/15/2016)   CAP (community acquired pneumonia) 05/2014   hx/notes 06/07/2014   Depression    GERD (gastroesophageal reflux disease)    Hypertension     Migraine    "once q 3-4 years" (07/15/2016)   Morbid obesity (HCC)    Type II diabetes mellitus (HCC)     Past Surgical History:  Procedure Laterality Date   KNEE ARTHROSCOPY Right 2000s   LAPAROSCOPIC GASTRIC SLEEVE RESECTION  11/2018    Family Psychiatric History:  Patient reports that he has multitude of psychiatric illnesses that run on both sides of his family. He reports that there have been several suicides. The following psychiatric illness run in his families: Anxiety and ADD/ADHD. Patient states that his uncle also suffered from substance abuse.  Family History:  Family History  Problem Relation Age of Onset   Diabetes Mother    Hypertension Mother    Hypertension Father     Social History:  Social History   Socioeconomic History   Marital status: Single    Spouse name: Not on file   Number of children: Not on file   Years of education: Not on file   Highest education level: Not on file  Occupational History   Occupation: courier  Tobacco Use   Smoking status: Every Day    Packs/day: 1.00    Years: 24.00    Pack years: 24.00    Types: Cigarettes   Smokeless tobacco: Former    Types: Snuff, Designer, multimediaChew  Vaping Use   Vaping Use: Never used  Substance and Sexual Activity   Alcohol use: No   Drug use: Not Currently    Types: Marijuana    Comment: smokes regularly   Sexual activity: Never  Other Topics Concern   Not on file  Social History Narrative   Not on file   Social Determinants of Health   Financial Resource Strain: Not on file  Food Insecurity: Not on file  Transportation Needs: Not on file  Physical Activity: Not on file  Stress: Not on file  Social Connections: Not on file    Allergies:  Allergies  Allergen Reactions   Colchicine Other (See Comments)    Worse HA ever had, vomiting, left side pain "it about killed me"   Lisinopril Other (See Comments) and Cough    Made throat dry also   Morphine Itching    States this is not an allergy  - 07-21-20   Oxycodone Hcl     States this is not an allergy - 07-21-20    Metabolic Disorder Labs: Lab Results  Component Value Date   HGBA1C 5.7 (H) 06/16/2020   MPG 116.89 06/16/2020   MPG 117 01/15/2020   Lab Results  Component Value Date   PROLACTIN 9.5 06/16/2020   Lab Results  Component Value Date   CHOL 188 06/16/2020   TRIG 270 (H) 06/16/2020   HDL 37 (L) 06/16/2020   CHOLHDL 5.1 06/16/2020   VLDL 54 (H) 06/16/2020   LDLCALC 97 06/16/2020   Lab Results  Component Value Date   TSH 2.003 06/16/2020   TSH 2.390 11/23/2012    Therapeutic Level Labs: No results found for: LITHIUM No results found for: VALPROATE No components found for:  CBMZ  Current Medications: Current Outpatient Medications  Medication Sig Dispense Refill  atomoxetine (STRATTERA) 40 MG capsule TAKE 1 CAPSULE BY MOUTH EVERY DAY 30 capsule 1   busPIRone (BUSPAR) 10 MG tablet Take 1 tablet (10 mg total) by mouth 3 (three) times daily. 90 tablet 1   cyclobenzaprine (FLEXERIL) 10 MG tablet Take 10 mg by mouth 3 (three) times daily as needed for muscle spasms.     dicyclomine (BENTYL) 20 MG tablet Take 1 tablet by mouth at bedtime as needed.     hydrocortisone cream 1 % Apply to affected area 2 times daily 15 g 0   vortioxetine HBr (TRINTELLIX) 5 MG TABS tablet Take 1 tablet (5 mg total) by mouth daily. 30 tablet 1   ZUBSOLV 5.7-1.4 MG SUBL Take 1 tablet by mouth 2 (two) times daily as needed for pain.     No current facility-administered medications for this visit.     Musculoskeletal: Strength & Muscle Tone: Unable to assess due to telemedicine visit Gait & Station: Unable to assess due to telemedicine visit Patient leans: Unable to assess due to telemedicine visit  Psychiatric Specialty Exam: Review of Systems  Psychiatric/Behavioral:  Negative for decreased concentration, dysphoric mood, hallucinations, self-injury, sleep disturbance and suicidal ideas. The patient is nervous/anxious. The  patient is not hyperactive.    There were no vitals taken for this visit.There is no height or weight on file to calculate BMI.  General Appearance: Unable to assess due to telemedicine visit  Eye Contact:  Unable to assess due to telemedicine visit  Speech:  Clear and Coherent and Normal Rate  Volume:  Normal  Mood:  Anxious and Euthymic  Affect:  Appropriate and Congruent  Thought Process:  Coherent and Descriptions of Associations: Intact  Orientation:  Full (Time, Place, and Person)  Thought Content: WDL   Suicidal Thoughts:  No  Homicidal Thoughts:  No  Memory:  Immediate;   Good Recent;   Good Remote;   Fair  Judgement:  Good  Insight:  Fair  Psychomotor Activity:  Normal  Concentration:  Concentration: Good and Attention Span: Good  Recall:  Fair  Fund of Knowledge: Fair  Language: Good  Akathisia:  NA  Handed:  Right  AIMS (if indicated): not done  Assets:  Communication Skills Desire for Improvement Housing Social Support Transportation Vocational/Educational  ADL's:  Intact  Cognition: WNL  Sleep:  Fair   Screenings: GAD-7    Flowsheet Row Video Visit from 04/12/2021 in [facility] Video Visit from 03/05/2021 in [facility] Office Visit from 11/29/2020 in [facility] Office Visit from 09/23/2016 in [facility]  Total GAD-7 Score 14 19 21 8       PHQ2-9    Flowsheet Row Video Visit from 04/12/2021 in [facility] Most recent reading at 04/12/2021  3:20 PM Video Visit from 03/05/2021 in [facility] Most recent reading at 03/05/2021  2:13 PM Counselor from 03/05/2021 in [facility] Most recent reading at 03/05/2021  9:33 AM Office Visit from 11/29/2020 in [facility] Most recent reading at 11/29/2020  8:35 AM ED from 11/28/2020 in [facility] Most recent reading at 11/28/2020 12:05 PM  PHQ-2 Total Score 2 5 5 6 4   PHQ-9 Total Score 9 9 9 20 10       Flowsheet Row Video Visit from 04/12/2021 in [facility] Most recent reading at 04/12/2021  3:20 PM Counsel[facility] Most recent reading at 03/12/2021  8:40 AM [facility] Most recent reading at 03/05/2021  2:14 PM  C-SSRS RISK CATEGORY No Risk No Risk No Risk        Assessment and Plan:   Brian BeetsJohn Day is a 41 year old male with a past psychiatric history significant for attention deficit hyperactivity disorder, major depressive disorder, generalized anxiety disorder and panic disorder who [facility] telephone visit for follow-up and medication management.  Patient reports that his depressive symptoms and anxiety have been minimal since taking his Trintellix.  Patient appears to be stable on his medications.  Patient is requesting refills following the conclusion of the encounter.  Patient's medications to be e-prescribed to pharmacy of choice.  1. Attention deficit hyperactivity disorder (ADHD), unspecified ADHD type  - atomoxetine (STRATTERA) 40 MG capsule; TAKE 1 CAPSULE BY MOUTH EVERY DAY  Dispense: 30 capsule; Refill: 1  2. Moderate episode of recurrent major depressive disorder (HCC)  - vortioxetine HBr (TRINTELLIX) 5 MG TABS tablet; Take 1 tablet (5 mg total) by mouth daily.  Dispense: 30 tablet; Refill: 1  3. Generalized anxiety disorder with panic attacks  - vortioxetine HBr (TRINTELLIX) 5 MG TABS tablet; Take 1 tablet (5 mg total) by mouth daily.  Dispense: 30 tablet; Refill: 1 - busPIRone (BUSPAR) 10 MG tablet; Take 1 tablet (10 mg total) by mouth 3 (three) times daily.  Dispense: 90 tablet; Refill: 1  Patient to follow up in 2  months Provider spent a total of 13 minutes with the patient/reviewing patient's chart  Meta HatchetUchenna E Kinsey Cowsert, PA 04/12/2021, 9:54 PM

## 2021-04-24 ENCOUNTER — Emergency Department (HOSPITAL_BASED_OUTPATIENT_CLINIC_OR_DEPARTMENT_OTHER)
Admission: EM | Admit: 2021-04-24 | Discharge: 2021-04-24 | Disposition: A | Discharge status: Home / Self Care | Payer: Medicaid Other | Attending: Emergency Medicine | Admitting: Emergency Medicine

## 2021-04-24 ENCOUNTER — Encounter (HOSPITAL_BASED_OUTPATIENT_CLINIC_OR_DEPARTMENT_OTHER): Payer: Self-pay

## 2021-04-24 ENCOUNTER — Other Ambulatory Visit: Payer: Self-pay

## 2021-04-24 DIAGNOSIS — U071 COVID-19: Secondary | ICD-10-CM | POA: Diagnosis not present

## 2021-04-24 DIAGNOSIS — R0981 Nasal congestion: Secondary | ICD-10-CM | POA: Diagnosis present

## 2021-04-24 LAB — RESP PANEL BY RT-PCR (FLU A&B, COVID) ARPGX2
Influenza A by PCR: NEGATIVE
Influenza B by PCR: NEGATIVE
SARS Coronavirus 2 by RT PCR: POSITIVE — AB

## 2021-04-24 NOTE — ED Provider Notes (Signed)
[facility] EMERGENCY DEPARTMENT Provider Note   CSN: [id number] Arrival date & time: 04/24/21  2029     History  Chief Complaint  Patient presents with   Covid Positive    Brian Day is a 41 y.o. male with no significant past medical history who complains of runny nose, body aches last week.  Recent home positive COVID test.  Patient reports that his symptoms are overall improving.  He denies any chest pain, shortness of breath, history of heart or lung disease, or other medical conditions.  He denies any active fever, chills, respiratory distress or other symptoms at this time.  Just wanted to know his official COVID status.  HPI     Home Medications Prior to Admission medications   Medication Sig Start Date End Date Taking? Authorizing Provider  atomoxetine (STRATTERA) 40 MG capsule TAKE 1 CAPSULE BY MOUTH EVERY DAY 04/12/21   Nwoko, Stephens Shire E, PA  busPIRone (BUSPAR) 10 MG tablet Take 1 tablet (10 mg total) by mouth 3 (three) times daily. 04/12/21   Nwoko, Tommas Olp, PA  cyclobenzaprine (FLEXERIL) 10 MG tablet Take 10 mg by mouth 3 (three) times daily as needed for muscle spasms.    [provider]  dicyclomine (BENTYL) 20 MG tablet Take 1 tablet by mouth at bedtime as needed.    [provider]  hydrocortisone cream 1 % Apply to affected area 2 times daily 07/04/20   Claude Manges, PA-C  vortioxetine HBr (TRINTELLIX) 5 MG TABS tablet Take 1 tablet (5 mg total) by mouth daily. 04/12/21   Nwoko, Tommas Olp, PA  ZUBSOLV 5.7-1.4 MG SUBL Take 1 tablet by mouth 2 (two) times daily as needed for pain. 01/09/20   [provider]      Allergies    Colchicine, Lisinopril, Morphine, and Oxycodone hcl    Review of Systems   Review of Systems  HENT:  Positive for rhinorrhea.   Musculoskeletal:  Positive for myalgias.  All other systems reviewed and are negative.  Physical Exam Updated Vital Signs BP 138/74 (BP Location: Right Arm)    Pulse 84     Temp 98.6 F (37 C) (Oral)    Resp 18    Ht 6\' 3"  (1.905 m)    Wt (!) 152 kg    SpO2 98%    BMI 41.88 kg/m  Physical Exam Vitals and nursing note reviewed.  Constitutional:      General: He is not in acute distress.    Appearance: Normal appearance. He is obese.  HENT:     Head: Normocephalic and atraumatic.  Eyes:     General:        Right eye: No discharge.        Left eye: No discharge.  Cardiovascular:     Rate and Rhythm: Normal rate and regular rhythm.     Heart sounds: No murmur heard.   No friction rub. No gallop.  Pulmonary:     Effort: Pulmonary effort is normal.     Breath sounds: Normal breath sounds.  Abdominal:     General: Bowel sounds are normal.     Palpations: Abdomen is soft.  Skin:    General: Skin is warm and dry.     Capillary Refill: Capillary refill takes less than 2 seconds.  Neurological:     Mental Status: He is alert and oriented to person, place, and time.  Psychiatric:        Mood and Affect: Mood normal.  Behavior: Behavior normal.    ED Results / Procedures / Treatments   Labs (all labs ordered are listed, but only abnormal results are displayed) Labs Reviewed  RESP PANEL BY RT-PCR (FLU A&B, COVID) ARPGX2 - Abnormal; Notable for the following components:      Result Value   SARS Coronavirus 2 by RT PCR POSITIVE (*)    All other components within normal limits    EKG None  Radiology No results found.  Procedures Procedures    Medications Ordered in ED Medications - No data to display  ED Course/ Medical Decision Making/ A&P                           Medical Decision Making  Is a well-appearing male with no significant past medical history who presents with concern for body aches, runny nose, with no shortness of breath, no chest pain.  He had a home positive COVID test.  His symptoms are resolving at this time.  My physical exam is remarkable for an obese male with no respiratory distress, no accessory heart sounds, no  significant tenderness to palpation, otherwise normal, nondistressed appearance.  RVP is positive for COVID.  Discussed with patient that as his symptoms began over a week ago will be no role for antiviral therapy at this time, believe that he is on the tail end of infection.  Encouraged continued quarantining, plenty of fluids, ibuprofen and Tylenol for fever, body aches, and rest.  Encourage strict masking for the next several days.  Patient discharged in stable condition at this time, return precautions given. Final Clinical Impression(s) / ED Diagnoses Final diagnoses:  COVID-19    Rx / DC Orders ED Discharge Orders     None         [place] 04/24/21 2249    Gwyneth Sprout, MD 04/25/21 1649

## 2021-04-24 NOTE — ED Triage Notes (Signed)
Pt c/o runny nose, body aches last week. Positive home covid test. Came here for covid test.

## 2021-04-24 NOTE — Discharge Instructions (Addendum)
You can use ibuprofen for pain, Tylenol for fevers and body aches.  Continue to drink plenty of fluids, inform any recent contacts of your diagnosis.  I recommend that you wear a mask outside everywhere that he go for the next 5 days at the very least.  Since your symptoms started last week you should not have to miss any work.

## 2021-05-02 ENCOUNTER — Ambulatory Visit (HOSPITAL_COMMUNITY): Payer: Medicaid Other | Admitting: Clinical

## 2021-05-30 ENCOUNTER — Ambulatory Visit (HOSPITAL_COMMUNITY): Payer: Medicaid Other | Admitting: Clinical

## 2021-06-14 ENCOUNTER — Encounter (HOSPITAL_COMMUNITY): Payer: Self-pay | Admitting: Physician Assistant

## 2021-06-14 ENCOUNTER — Telehealth (INDEPENDENT_AMBULATORY_CARE_PROVIDER_SITE_OTHER): Payer: Medicaid Other | Admitting: Physician Assistant

## 2021-06-14 DIAGNOSIS — F331 Major depressive disorder, recurrent, moderate: Secondary | ICD-10-CM | POA: Diagnosis not present

## 2021-06-14 DIAGNOSIS — F909 Attention-deficit hyperactivity disorder, unspecified type: Secondary | ICD-10-CM

## 2021-06-14 DIAGNOSIS — F410 Panic disorder [episodic paroxysmal anxiety] without agoraphobia: Secondary | ICD-10-CM | POA: Diagnosis not present

## 2021-06-14 DIAGNOSIS — F411 Generalized anxiety disorder: Secondary | ICD-10-CM

## 2021-06-14 MED ORDER — VORTIOXETINE HBR 10 MG PO TABS: 10.0000 mg | ORAL_TABLET | Freq: Every day | ORAL | 1 refills | Status: DC

## 2021-06-14 MED ORDER — BUSPIRONE HCL 15 MG PO TABS: 15.0000 mg | ORAL_TABLET | Freq: Three times a day (TID) | ORAL | 2 refills | Status: DC

## 2021-06-14 MED ORDER — ATOMOXETINE HCL 40 MG PO CAPS: ORAL_CAPSULE | ORAL | 1 refills | Status: DC

## 2021-06-14 NOTE — Progress Notes (Addendum)
BH MD/PA/NP OP Progress Note ? ?Virtual Visit via Telephone Note ? ?I connected with Brian Day on 06/14/21 at  3:00 PM EDT by telephone and verified that I am speaking with the correct person using two identifiers. ? ?Location: ?Patient: Home ?Provider: Clinic ?  ?I discussed the limitations, risks, security and privacy concerns of performing an evaluation and management service by telephone and the availability of in person appointments. I also discussed with the patient that there may be a patient responsible charge related to this service. The patient expressed understanding and agreed to proceed. ? ?Follow Up Instructions: ? ?I discussed the assessment and treatment plan with the patient. The patient was provided an opportunity to ask questions and all were answered. The patient agreed with the plan and demonstrated an understanding of the instructions. ?  ?The patient was advised to call back or seek an in-person evaluation if the symptoms worsen or if the condition fails to improve as anticipated. ? ?I provided 9 minutes of non-face-to-face time during this encounter. ? ?Meta Hatchet, PA ? ? ?06/14/2021 10:58 PM ?Brian Day  ?MRN:  [id number] ? ?Chief Complaint:  ?Chief Complaint  ?Patient presents with  ? Follow-up  ? Medication Management  ? ?HPI:  ? ?Brian Day is a 41 year old male with a past psychiatric history significant for major depressive disorder, attention deficit hyperactivity disorder (unspecified type), and generalized anxiety disorder with panic attacks who presents to [facility] via virtual telephone visit for follow-up and medication management.  Patient is currently being managed on the following medications: ? ?Strattera 40 mg daily ?Vortioxetine HBr (Trintellix) 5 mg daily ?Buspirone 10 mg 3 times daily ? ?Patient reports no issues or concerns with his current medication regimen.  Patient denies experiencing depressive symptoms but  does endorse anxiety.  Patient rates his anxiety an 8 out of 10 but is unable to attribute any stressors to his anxiety.  Patient denies any other concerns regarding his mental health.  A PHQ-9 screen was performed with the patient scoring a 10.  A GAD-7 screen was performed with the patient scoring an 18. ? ?Patient is alert and oriented x4, calm, cooperative, and fully engaged in conversation during the encounter.  Patient endorses feeling nervous and relatively anxious.  Patient denies suicidal or homicidal ideations.  He further denies auditory or visual hallucinations and does not appear to be responding to internal/external stimuli.  Patient endorses fair sleep and receives on average 6 to 8 hours of sleep each night.  Patient endorses good appetite and eats on average 2-3 meals per day.  Patient denies alcohol consumption and illicit drug use.  Patient denies tobacco use but does engage in vaping. ? ?Visit Diagnosis:  ?  ICD-10-CM   ?1. Attention deficit hyperactivity disorder (ADHD), unspecified ADHD type  F90.9 atomoxetine (STRATTERA) 40 MG capsule  ?  ?2. Generalized anxiety disorder with panic attacks  F41.1 busPIRone (BUSPAR) 15 MG tablet  ? F41.0 vortioxetine HBr (TRINTELLIX) 10 MG TABS tablet  ?  ?3. Moderate episode of recurrent major depressive disorder (HCC)  F33.1 vortioxetine HBr (TRINTELLIX) 10 MG TABS tablet  ?  ? ? ?Past Psychiatric History:  ?Major depressive disorder ?Generalized anxiety disorder ?Panic disorder ?Attention deficit hyperactivity disorder (unspecified type) ? ?Past Medical History:  ?Past Medical History:  ?Diagnosis Date  ? Addiction, opium (HCC)   ? Anxiety   ? Arthritis   ? "right knee" (07/15/2016)  ? CAP (community acquired pneumonia) 05/2014  ?  hx/notes 06/07/2014  ? Depression   ? GERD (gastroesophageal reflux disease)   ? Hypertension   ? Migraine   ? "once q 3-4 years" (07/15/2016)  ? Morbid obesity (HCC)   ? Type II diabetes mellitus (HCC)   ?  ?Past Surgical History:   ?Procedure Laterality Date  ? KNEE ARTHROSCOPY Right 2000s  ? LAPAROSCOPIC GASTRIC SLEEVE RESECTION  11/2018  ? ? ?Family Psychiatric History:  ?Patient reports that he has multitude of psychiatric illnesses that run on both sides of his family. He reports that there have been several suicides. The following psychiatric illness run in his families: Anxiety and ADD/ADHD. Patient states that his uncle also suffered from substance abuse. ? ?Family History:  ?Family History  ?Problem Relation Age of Onset  ? Diabetes Mother   ? Hypertension Mother   ? Hypertension Father   ? ? ?Social History:  ?Social History  ? ?Socioeconomic History  ? Marital status: Single  ?  Spouse name: Not on file  ? Number of children: Not on file  ? Years of education: Not on file  ? Highest education level: Not on file  ?Occupational History  ? Occupation: courier  ?Tobacco Use  ? Smoking status: Every Day  ?  Packs/day: 1.00  ?  Years: 24.00  ?  Pack years: 24.00  ?  Types: Cigarettes  ? Smokeless tobacco: Former  ?  Types: Snuff, Chew  ?Vaping Use  ? Vaping Use: Never used  ?Substance and Sexual Activity  ? Alcohol use: No  ? Drug use: Not Currently  ?  Types: Marijuana  ?  Comment: smokes regularly  ? Sexual activity: Never  ?Other Topics Concern  ? Not on file  ?Social History Narrative  ? Not on file  ? ?Social Determinants of Health  ? ?Financial Resource Strain: Not on file  ?Food Insecurity: Not on file  ?Transportation Needs: Not on file  ?Physical Activity: Not on file  ?Stress: Not on file  ?Social Connections: Not on file  ? ? ?Allergies:  ?Allergies  ?Allergen Reactions  ? Colchicine Other (See Comments)  ?  Worse HA ever had, vomiting, left side pain "it about killed me"  ? Lisinopril Other (See Comments) and Cough  ?  Made throat dry also  ? Morphine Itching  ?  States this is not an allergy - 07-21-20  ? Oxycodone Hcl   ?  States this is not an allergy - 07-21-20  ? ? ?Metabolic Disorder Labs: ?Lab Results  ?Component Value  Date  ? HGBA1C 5.7 (H) 06/16/2020  ? MPG 116.89 06/16/2020  ? MPG 117 01/15/2020  ? ?Lab Results  ?Component Value Date  ? PROLACTIN 9.5 06/16/2020  ? ?Lab Results  ?Component Value Date  ? CHOL 188 06/16/2020  ? TRIG 270 (H) 06/16/2020  ? HDL 37 (L) 06/16/2020  ? CHOLHDL 5.1 06/16/2020  ? VLDL 54 (H) 06/16/2020  ? LDLCALC 97 06/16/2020  ? ?Lab Results  ?Component Value Date  ? TSH 2.003 06/16/2020  ? TSH 2.390 11/23/2012  ? ? ?Therapeutic Level Labs: ?No results found for: LITHIUM ?No results found for: VALPROATE ?No components found for:  CBMZ ? ?Current Medications: ?Current Outpatient Medications  ?Medication Sig Dispense Refill  ? atomoxetine (STRATTERA) 40 MG capsule TAKE 1 CAPSULE BY MOUTH EVERY DAY 30 capsule 1  ? busPIRone (BUSPAR) 15 MG tablet Take 1 tablet (15 mg total) by mouth 3 (three) times daily. 90 tablet 2  ? cyclobenzaprine (FLEXERIL) 10  MG tablet Take 10 mg by mouth 3 (three) times daily as needed for muscle spasms.    ? dicyclomine (BENTYL) 20 MG tablet Take 1 tablet by mouth at bedtime as needed.    ? hydrocortisone cream 1 % Apply to affected area 2 times daily 15 g 0  ? vortioxetine HBr (TRINTELLIX) 10 MG TABS tablet Take 1 tablet (10 mg total) by mouth daily. 30 tablet 1  ? ZUBSOLV 5.7-1.4 MG SUBL Take 1 tablet by mouth 2 (two) times daily as needed for pain.    ? ?No current facility-administered medications for this visit.  ? ? ? ?Musculoskeletal: ?Strength & Muscle Tone: Unable to assess due to telemedicine visit ?Gait & Station: Unable to assess due to telemedicine visit ?Patient leans: Unable to assess due to telemedicine visit ? ?Psychiatric Specialty Exam: ?Review of Systems  ?Psychiatric/Behavioral:  Negative for decreased concentration, dysphoric mood, hallucinations, self-injury, sleep disturbance and suicidal ideas. The patient is nervous/anxious. The patient is not hyperactive.    ?There were no vitals taken for this visit.There is no height or weight on file to calculate BMI.   ?General Appearance: Unable to assess due to telemedicine visit  ?Eye Contact:  Unable to assess due to telemedicine visit  ?Speech:  Clear and Coherent and Normal Rate  ?Volume:  Normal  ?Mood:  Anxious

## 2021-06-17 ENCOUNTER — Telehealth (HOSPITAL_COMMUNITY): Payer: Self-pay | Admitting: *Deleted

## 2021-06-17 NOTE — Telephone Encounter (Signed)
[place] [employer]ICA[employer]  WILL NOT COVER ?vortioxetine HBr (TRINTELLIX) 10 MG TABS tablet. ? ?WILL COVER THE FOLLOWING: ?DULOXETINE ?CITALOPRAM ?FLUOXETINE ?PAROXETINE ?SERTRALINE ?VENLAFAXINE  ?VILAZODONE ?

## 2021-06-18 ENCOUNTER — Other Ambulatory Visit (HOSPITAL_COMMUNITY): Payer: Self-pay | Admitting: Physician Assistant

## 2021-06-18 DIAGNOSIS — F410 Panic disorder [episodic paroxysmal anxiety] without agoraphobia: Secondary | ICD-10-CM

## 2021-06-18 DIAGNOSIS — F331 Major depressive disorder, recurrent, moderate: Secondary | ICD-10-CM

## 2021-06-18 MED ORDER — VORTIOXETINE HBR 5 MG PO TABS: 10.0000 mg | ORAL_TABLET | Freq: Every day | ORAL | 1 refills | Status: DC

## 2021-06-18 NOTE — Telephone Encounter (Signed)
Provider was contacted by Eliezer Lofts, RMA regarding message that patient's medicaid does not cover vortioxetine HBr (trintellix) 10 mg TABS. Provider to re-prescribe patient Trintellix at 5 mg two tablets daily. Patient's medication to be e-prescribed to pharmacy of choice.

## 2021-06-18 NOTE — Progress Notes (Signed)
Provider was contacted by Direce E. McIntyre, RMA regarding message that patient's medicaid does not cover vortioxetine HBr (trintellix) 10 mg TABS. Provider to re-prescribe patient Trintellix at 5 mg two tablets daily. Patient's medication to be e-prescribed to pharmacy of choice.

## 2021-08-16 ENCOUNTER — Telehealth (INDEPENDENT_AMBULATORY_CARE_PROVIDER_SITE_OTHER): Payer: Medicaid Other | Admitting: Physician Assistant

## 2021-08-16 DIAGNOSIS — F909 Attention-deficit hyperactivity disorder, unspecified type: Secondary | ICD-10-CM

## 2021-08-16 DIAGNOSIS — F411 Generalized anxiety disorder: Secondary | ICD-10-CM | POA: Diagnosis not present

## 2021-08-16 DIAGNOSIS — F331 Major depressive disorder, recurrent, moderate: Secondary | ICD-10-CM

## 2021-08-16 DIAGNOSIS — F410 Panic disorder [episodic paroxysmal anxiety] without agoraphobia: Secondary | ICD-10-CM

## 2021-08-19 ENCOUNTER — Encounter (HOSPITAL_COMMUNITY): Payer: Self-pay | Admitting: Physician Assistant

## 2021-08-19 MED ORDER — BUSPIRONE HCL 15 MG PO TABS: 15.0000 mg | ORAL_TABLET | Freq: Three times a day (TID) | ORAL | 2 refills | Status: AC

## 2021-08-19 MED ORDER — VORTIOXETINE HBR 5 MG PO TABS: 10.0000 mg | ORAL_TABLET | Freq: Every day | ORAL | 2 refills | Status: DC

## 2021-08-19 MED ORDER — ATOMOXETINE HCL 40 MG PO CAPS: ORAL_CAPSULE | ORAL | 2 refills | Status: AC

## 2021-08-19 NOTE — Progress Notes (Cosign Needed)
BH MD/PA/NP OP Progress Note  Virtual Visit via Telephone Note  I connected with Brian Day on 08/16/21 at  3:00 PM EDT by telephone and verified that I am speaking with the correct person using two identifiers.  Location: Patient: Home Provider: Clinic   I discussed the limitations, risks, security and privacy concerns of performing an evaluation and management service by telephone and the availability of in person appointments. I also discussed with the patient that there may be a patient responsible charge related to this service. The patient expressed understanding and agreed to proceed.  Follow Up Instructions:  I discussed the assessment and treatment plan with the patient. The patient was provided an opportunity to ask questions and all were answered. The patient agreed with the plan and demonstrated an understanding of the instructions.   The patient was advised to call back or seek an in-person evaluation if the symptoms worsen or if the condition fails to improve as anticipated.  I provided 8 minutes of non-face-to-face time during this encounter.  Meta Hatchet, PA   08/16/2021 3:58 PM Brian Day  MRN:  [id number]  Chief Complaint:  No chief complaint on file.  HPI:   Brian Day is a 41 year old male with a past psychiatric history significant for major depressive disorder, attention deficit hyperactivity disorder (unspecified type), and generalized anxiety disorder with panic attacks who presents to [facility] via virtual telephone visit for follow-up and medication management.  Patient is currently being managed on the following medications:  Strattera 40 mg daily Vortioxetine HBr (Trintellix) 5 mg daily Buspirone 10 mg 3 times daily    Patient is alert and oriented x4, calm, cooperative, and fully engaged in conversation during the encounter.  Patient endorses feeling nervous and relatively anxious.  Patient  denies suicidal or homicidal ideations.  He further denies auditory or visual hallucinations and does not appear to be responding to internal/external stimuli.  Patient endorses fair sleep and receives on average 6 to 8 hours of sleep each night.  Patient endorses good appetite and eats on average 2-3 meals per day.  Patient denies alcohol consumption and illicit drug use.  Patient denies tobacco use but does engage in vaping.  Visit Diagnosis:    ICD-10-CM   1. Attention deficit hyperactivity disorder (ADHD), unspecified ADHD type  F90.9 atomoxetine (STRATTERA) 40 MG capsule    2. Generalized anxiety disorder with panic attacks  F41.1 busPIRone (BUSPAR) 15 MG tablet   F41.0 vortioxetine HBr (TRINTELLIX) 5 MG TABS tablet    3. Moderate episode of recurrent major depressive disorder (HCC)  F33.1 vortioxetine HBr (TRINTELLIX) 5 MG TABS tablet      Past Psychiatric History:  Major depressive disorder Generalized anxiety disorder Panic disorder Attention deficit hyperactivity disorder (unspecified type)  Past Medical History:  Past Medical History:  Diagnosis Date   Addiction, opium (HCC)    Anxiety    Arthritis    "right knee" (07/15/2016)   CAP (community acquired pneumonia) 05/2014   hx/notes 06/07/2014   Depression    GERD (gastroesophageal reflux disease)    Hypertension    Migraine    "once q 3-4 years" (07/15/2016)   Morbid obesity (HCC)    Type II diabetes mellitus (HCC)     Past Surgical History:  Procedure Laterality Date   KNEE ARTHROSCOPY Right 2000s   LAPAROSCOPIC GASTRIC SLEEVE RESECTION  11/2018    Family Psychiatric History:  Patient reports that he has multitude of psychiatric illnesses that  run on both sides of his family. He reports that there have been several suicides. The following psychiatric illness run in his families: Anxiety and ADD/ADHD. Patient states that his uncle also suffered from substance abuse.  Family History:  Family History  Problem  Relation Age of Onset   Diabetes Mother    Hypertension Mother    Hypertension Father     Social History:  Social History   Socioeconomic History   Marital status: Single    Spouse name: Not on file   Number of children: Not on file   Years of education: Not on file   Highest education level: Not on file  Occupational History   Occupation: courier  Tobacco Use   Smoking status: Every Day    Packs/day: 1.00    Years: 24.00    Pack years: 24.00    Types: Cigarettes   Smokeless tobacco: Former    Types: Snuff, Designer, multimedia Use   Vaping Use: Never used  Substance and Sexual Activity   Alcohol use: No   Drug use: Not Currently    Types: Marijuana    Comment: smokes regularly   Sexual activity: Never  Other Topics Concern   Not on file  Social History Narrative   Not on file   Social Determinants of Health   Financial Resource Strain: Not on file  Food Insecurity: Not on file  Transportation Needs: Not on file  Physical Activity: Not on file  Stress: Not on file  Social Connections: Not on file    Allergies:  Allergies  Allergen Reactions   Colchicine Other (See Comments)    Worse HA ever had, vomiting, left side pain "it about killed me"   Lisinopril Other (See Comments) and Cough    Made throat dry also   Morphine Itching    States this is not an allergy - 07-21-20   Oxycodone Hcl     States this is not an allergy - 07-21-20    Metabolic Disorder Labs: Lab Results  Component Value Date   HGBA1C 5.7 (H) 06/16/2020   MPG 116.89 06/16/2020   MPG 117 01/15/2020   Lab Results  Component Value Date   PROLACTIN 9.5 06/16/2020   Lab Results  Component Value Date   CHOL 188 06/16/2020   TRIG 270 (H) 06/16/2020   HDL 37 (L) 06/16/2020   CHOLHDL 5.1 06/16/2020   VLDL 54 (H) 06/16/2020   LDLCALC 97 06/16/2020   Lab Results  Component Value Date   TSH 2.003 06/16/2020   TSH 2.390 11/23/2012    Therapeutic Level Labs: No results found for:  LITHIUM No results found for: VALPROATE No components found for:  CBMZ  Current Medications: Current Outpatient Medications  Medication Sig Dispense Refill   atomoxetine (STRATTERA) 40 MG capsule TAKE 1 CAPSULE BY MOUTH EVERY DAY 30 capsule 2   busPIRone (BUSPAR) 15 MG tablet Take 1 tablet (15 mg total) by mouth 3 (three) times daily. 90 tablet 2   cyclobenzaprine (FLEXERIL) 10 MG tablet Take 10 mg by mouth 3 (three) times daily as needed for muscle spasms.     dicyclomine (BENTYL) 20 MG tablet Take 1 tablet by mouth at bedtime as needed.     hydrocortisone cream 1 % Apply to affected area 2 times daily 15 g 0   vortioxetine HBr (TRINTELLIX) 5 MG TABS tablet Take 2 tablets (10 mg total) by mouth daily. 60 tablet 2   ZUBSOLV 5.7-1.4 MG SUBL Take 1 tablet by  mouth 2 (two) times daily as needed for pain.     No current facility-administered medications for this visit.     Musculoskeletal: Strength & Muscle Tone: Unable to assess due to telemedicine visit Gait & Station: Unable to assess due to telemedicine visit Patient leans: Unable to assess due to telemedicine visit  Psychiatric Specialty Exam: Review of Systems  Psychiatric/Behavioral:  Negative for decreased concentration, dysphoric mood, hallucinations, self-injury, sleep disturbance and suicidal ideas. The patient is not nervous/anxious and is not hyperactive.    There were no vitals taken for this visit.There is no height or weight on file to calculate BMI.  General Appearance: Unable to assess due to telemedicine visit  Eye Contact:  Unable to assess due to telemedicine visit  Speech:  Clear and Coherent and Normal Rate  Volume:  Normal  Mood:  Euthymic  Affect:  Appropriate  Thought Process:  Coherent, Goal Directed, and Descriptions of Associations: Intact  Orientation:  Full (Time, Place, and Person)  Thought Content: WDL   Suicidal Thoughts:  No  Homicidal Thoughts:  No  Memory:  Immediate;   Good Recent;    Good Remote;   Fair  Judgement:  Good  Insight:  Fair  Psychomotor Activity:  Normal  Concentration:  Concentration: Good and Attention Span: Good  Recall:  [employer] of Knowledge: Fair  Language: Good  Akathisia:  No  Handed:  Right  AIMS (if indicated): not done  Assets:  Communication Skills Desire for Improvement Housing Social Support Transportation Vocational/Educational  ADL's:  Intact  Cognition: WNL  Sleep:  Good   Screenings: GAD-7    Flowsheet Row Video Visit from 08/16/2021 in [facility] Video Visit from 06/14/2021 in [facility] Video Visit from 04/12/2021 in [facility] Video Visit from 03/05/2021 in [facility] Office Visit from 11/29/2020 in [facility]  Total GAD-7 Score 8 18 14 19 21       PHQ2-9    Flowsheet Row Video Visit from 08/16/2021 in [facility] Most recent reading at 08/16/2021  3:09 PM Video Visit from 06/14/2021 in [facility] Most recent reading at 06/14/2021  3:09 PM Video Visit from 04/12/2021 in [facility] Most recent reading at 04/12/2021  3:20 PM Video Visit from 03/05/2021 in [facility] Most recent reading at 03/05/2021  2:13 PM Counselor from 03/05/2021 in [facility] Most recent reading at 03/05/2021  9:33 AM  PHQ-2 Total Score 1 2 2 5 5   PHQ-9 Total Score -- 10 9 9 9       Flowsheet Row Video Visit from 08/16/2021 in [facility] Video Visit from 06/14/2021 in [facility] ED from 04/24/2021 in [facility]PARTMENT  C-SSRS RISK CATEGORY Low Risk Low Risk No Risk        Assessment and Plan:   Nil Xiong is a 41 year old male with a past psychiatric history significant for  major depressive disorder, attention deficit hyperactivity disorder (unspecified type), and generalized anxiety disorder with panic attacks who presents to [facility] via virtual telephone visit for follow-up and medication management.   Collaboration of Care: Collaboration of Care: Medication Management AEB provider managing patient's psychiatric medications and Psychiatrist AEB patient being followed by a mental health provider.  Patient/Guardian was advised Release of Information must  be obtained prior to any record release in order to collaborate their care with an outside provider. Patient/Guardian was advised if they have not already done so to contact the registration department to sign all necessary forms in order for us to release information regarding their care.   Consent: Patient/Guardian gives verbal consent for treatment and assignment of benefits for services provided during this visit. Patient/Guardian expressed understanding and agreed to proceed.   1. Attention deficit hyperactivity disorder (ADHD), unspecified ADHD type  - atomoxetine (STRATTERA) 40 MG capsule; TAKE 1 CAPSULE BY MOUTH EVERY DAY  Dispense: 30 capsule; Refill: 2  2. Generalized anxiety disorder with panic attacks  - busPIRone (BUSPAR) 15 MG tablet; Take 1 tablet (15 mg total) by mouth 3 (three) times daily.  Dispense: 90 tablet; Refill: 2 - vortioxetine HBr (TRINTELLIX) 5 MG TABS tablet; Take 2 tablets (10 mg total) by mouth daily.  Dispense: 60 tablet; Refill: 2  3. Moderate episode of recurrent major depressive disorder (HCC)  - vortioxetine HBr (TRINTELLIX) 5 MG TABS tablet; Take 2 tablets (10 mg total) by mouth daily.  Dispense: 60 tablet; Refill: 2  Patient to follow up in 3 months Provider spent a total of 8 minutes with the patient/reviewing patient's chart  Meta HatchetUchenna E Deepika [place], PA 06/14/2021, 10:58 PM

## 2021-08-22 ENCOUNTER — Telehealth (HOSPITAL_COMMUNITY): Payer: Self-pay | Admitting: *Deleted

## 2021-08-22 ENCOUNTER — Other Ambulatory Visit (HOSPITAL_COMMUNITY): Payer: Self-pay | Admitting: Physician Assistant

## 2021-08-22 DIAGNOSIS — F331 Major depressive disorder, recurrent, moderate: Secondary | ICD-10-CM

## 2021-08-22 DIAGNOSIS — F410 Panic disorder [episodic paroxysmal anxiety] without agoraphobia: Secondary | ICD-10-CM

## 2021-08-22 MED ORDER — VORTIOXETINE HBR 10 MG PO TABS: 10.0000 mg | ORAL_TABLET | Freq: Every day | ORAL | 2 refills | Status: AC

## 2021-08-22 NOTE — Telephone Encounter (Signed)
Provider was contacted by Eliezer Lofts, RMA regarding the rejection of patient's medication prescription by his insurance due to insurance not covering quantity. Provider to change patient's prescription instruction to trintellix 10 mg daily. Patient's medication to be e-prescribed to pharmacy of choice.

## 2021-08-22 NOTE — Telephone Encounter (Signed)
[place] [employer] has rejected coverage for vortioxetine HBr (TRINTELLIX) 5 MG TABS tablet Take 2 tablets (10 mg total) by mouth daily   As it exceeds the insurance plan's limit, quantity &/or day supply

## 2021-08-22 NOTE — Progress Notes (Signed)
Provider was contacted by Direce E. McIntyre, RMA regarding the rejection of patient's medication prescription by his insurance due to insurance not covering quantity. Provider to change patient's prescription instruction to trintellix 10 mg daily. Patient's medication to be e-prescribed to pharmacy of choice.

## 2022-05-02 IMAGING — DX DG WRIST COMPLETE 3+V*R*
4 series · 4 of 4 positions shown · non-contrast
Comparison: Radiograph 12/09/2020

CLINICAL DATA: Other closed extra-articular fracture of distal
right radius with routine healing, subsequent encounter. Follow-up
fracture.

EXAM:
RIGHT WRIST - COMPLETE 3+ VIEW

[wrist pa]
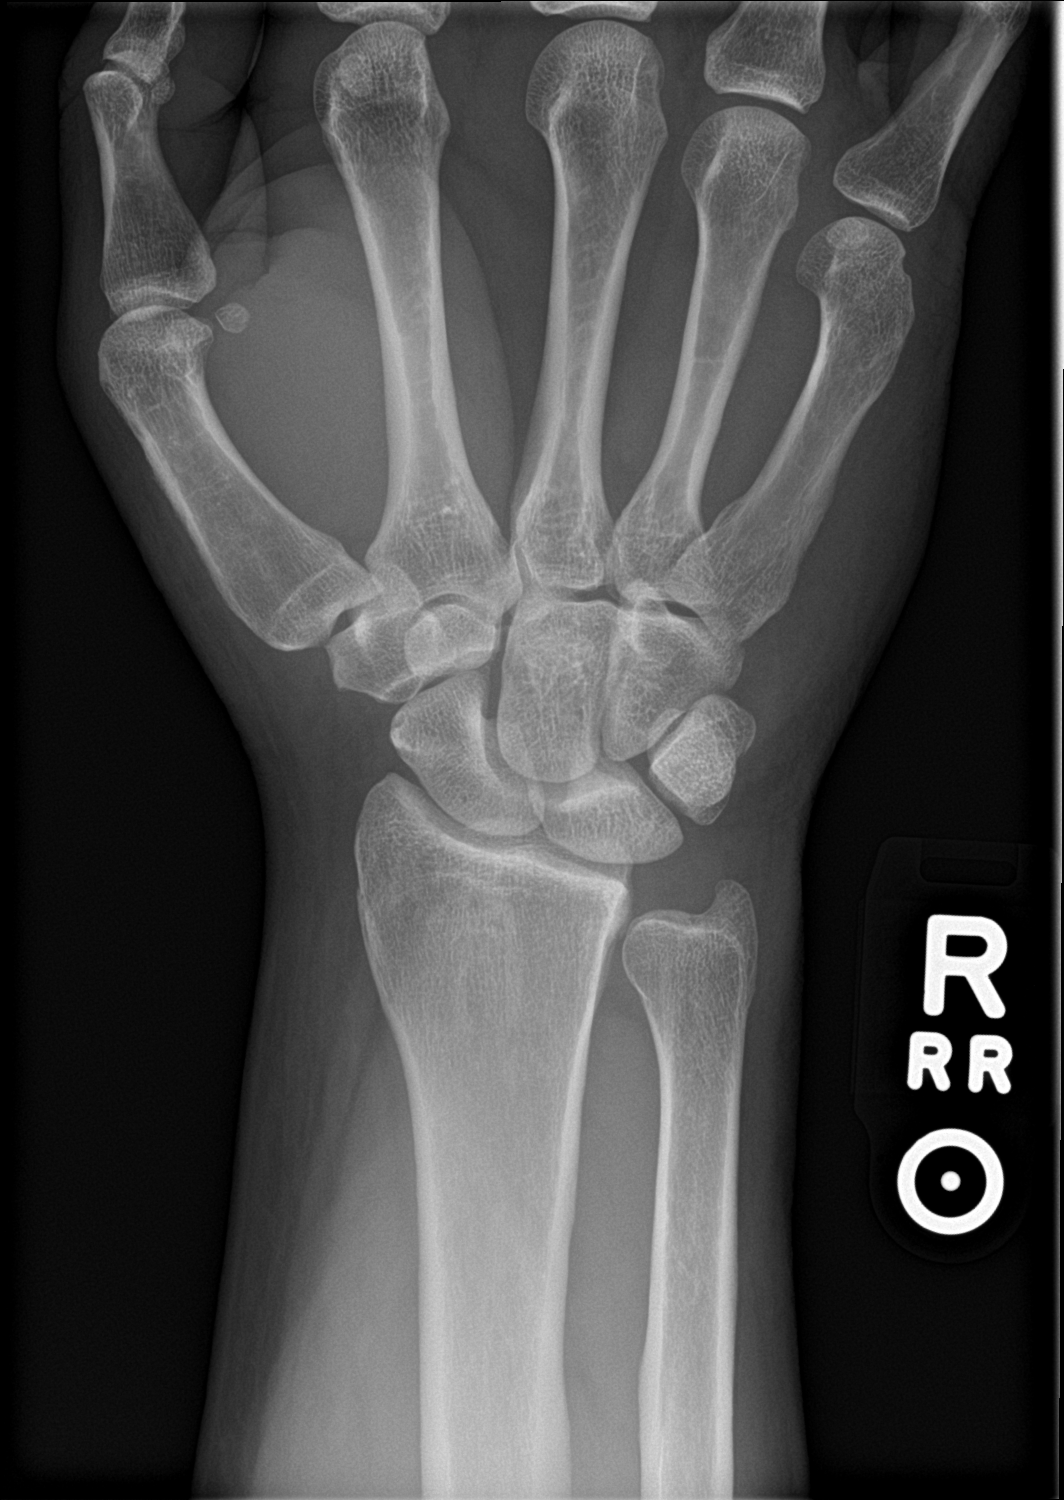

[wrist obl]
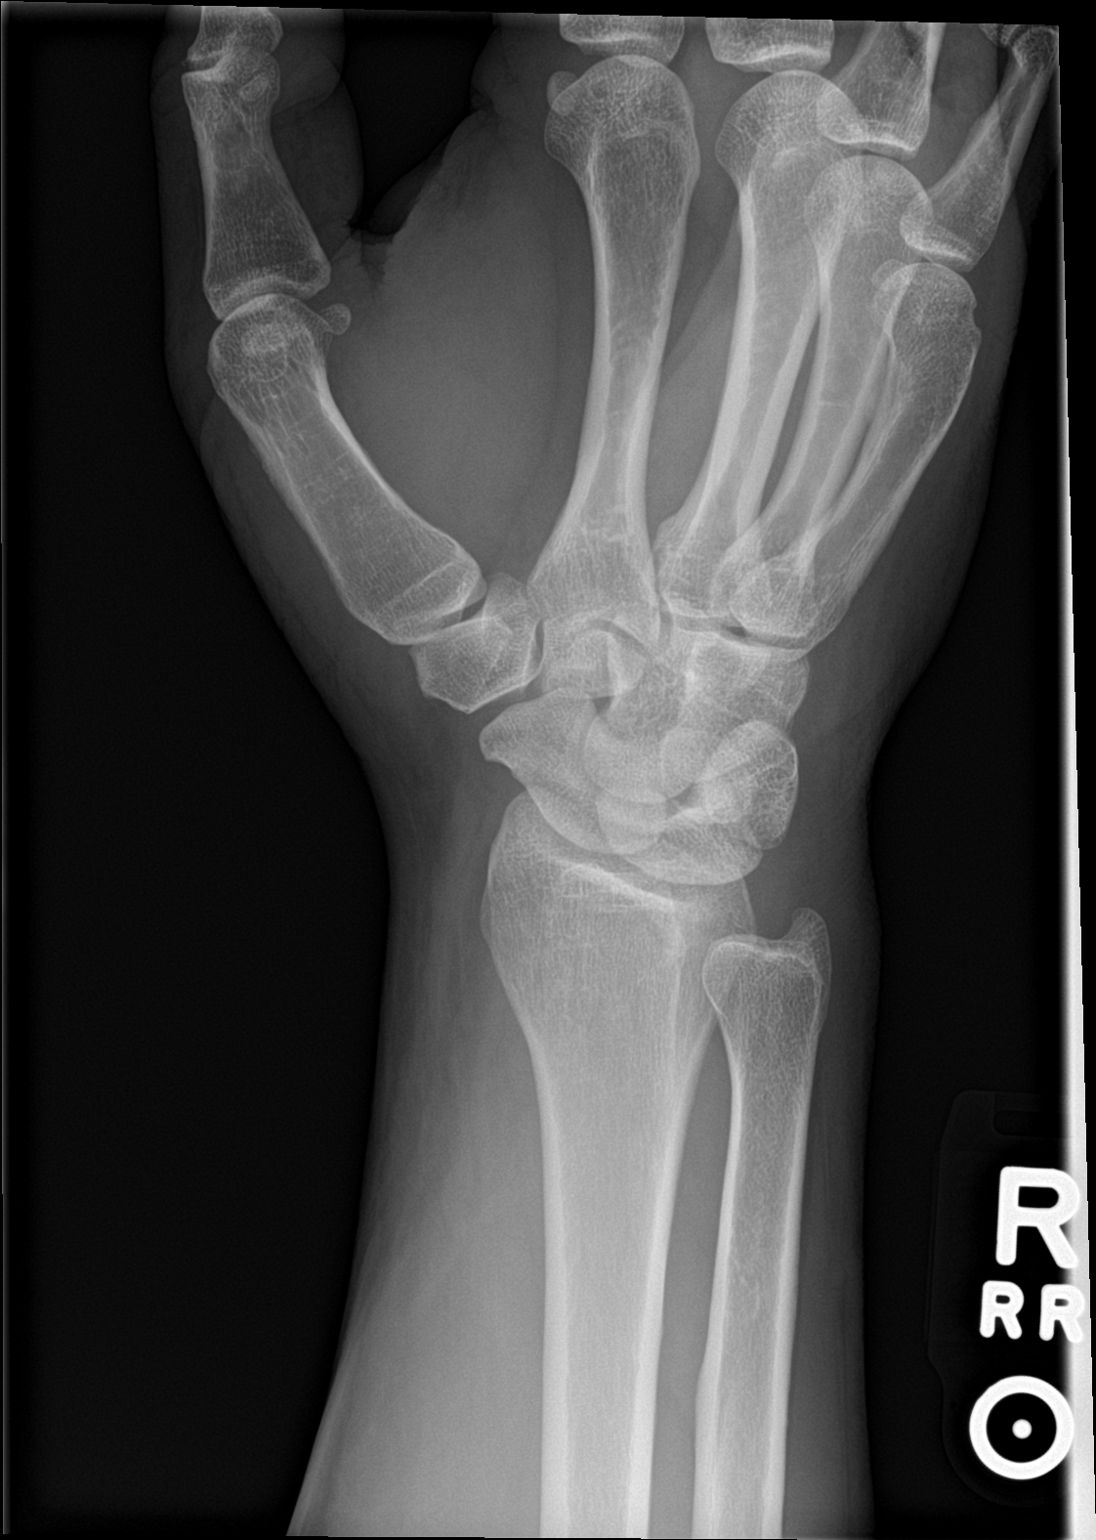

[wrist lat]
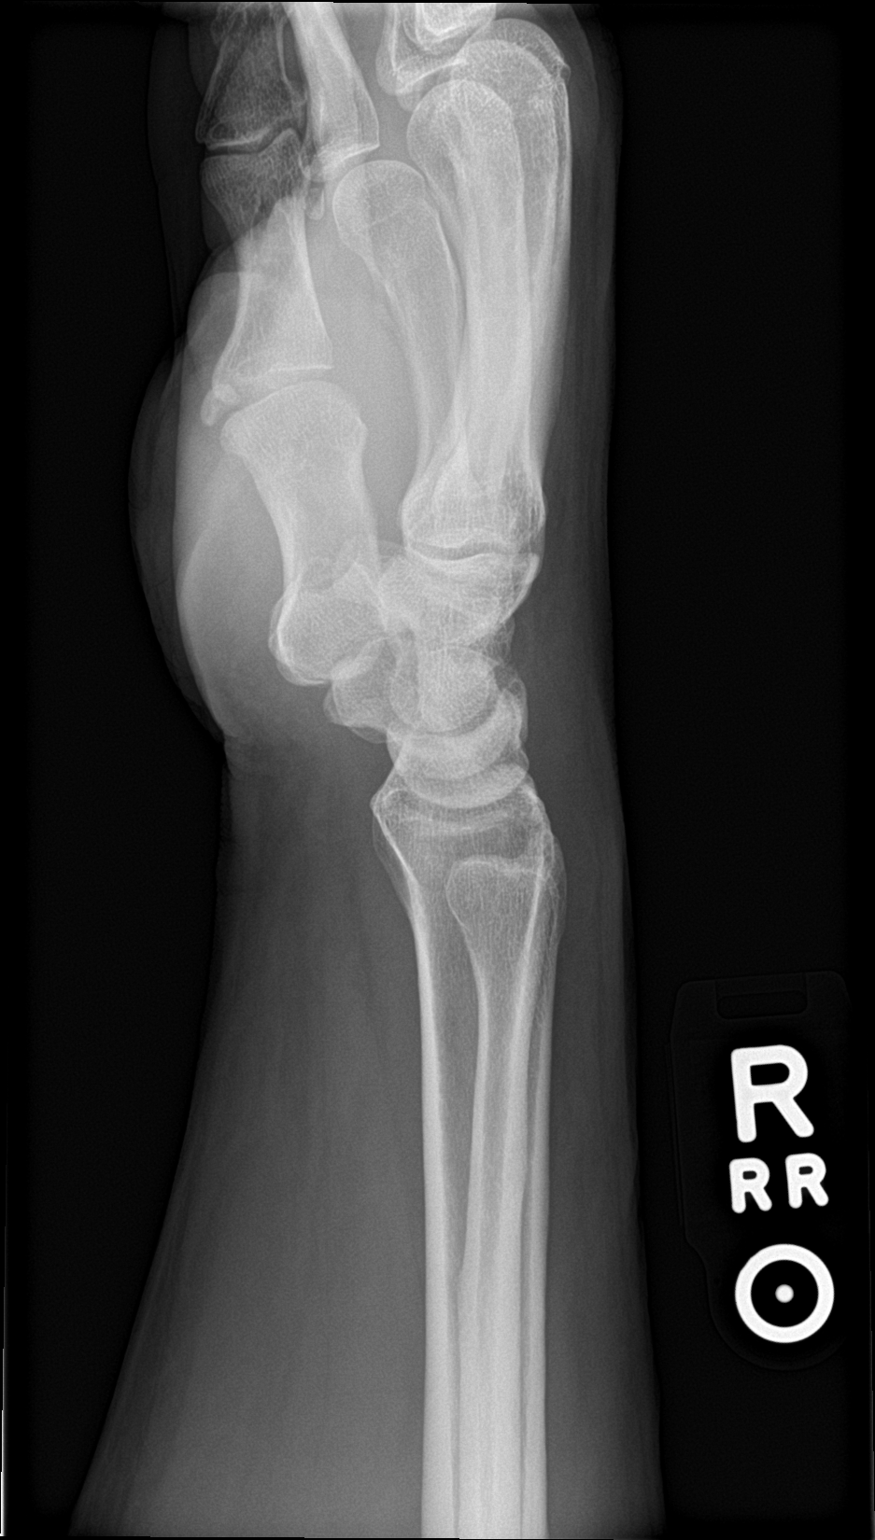

[wrist navicular]
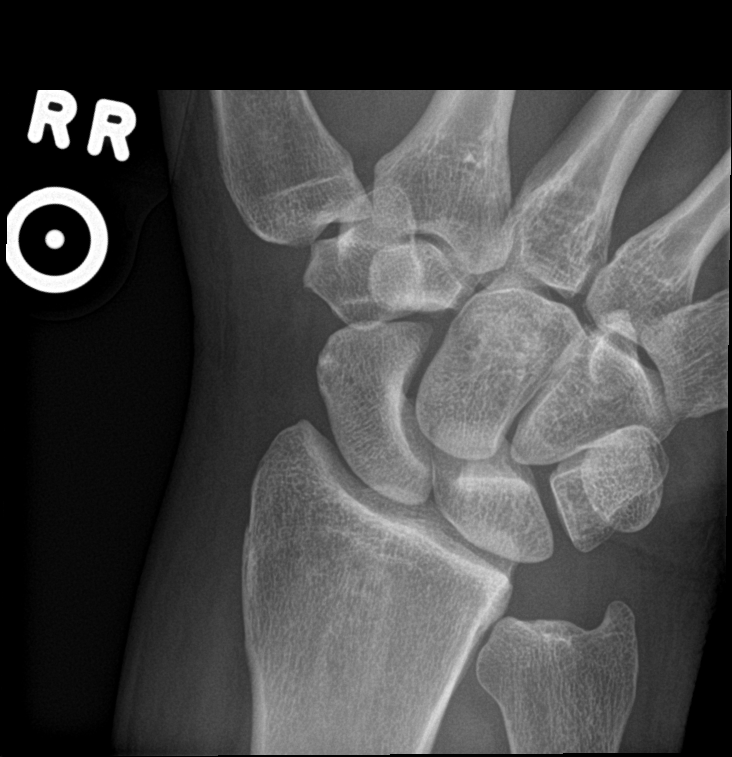

[4 of 4 positions shown; findings below may reference images not displayed]

FINDINGS: The previous buckle fracture of the dorsal radius is not
definitively seen on the current exam, suggestive of interval
healing. There is no new fracture. Normal alignment and joint
spaces. Mild dorsal soft tissue edema persists.
IMPRESSION: Previously seen dorsal buckle fracture is not definitively seen on
the current exam, suggestive of interval healing. No new fracture.

## 2022-05-12 ENCOUNTER — Other Ambulatory Visit: Payer: Self-pay

## 2022-05-12 ENCOUNTER — Emergency Department (HOSPITAL_BASED_OUTPATIENT_CLINIC_OR_DEPARTMENT_OTHER)
Admission: EM | Admit: 2022-05-12 | Discharge: 2022-05-12 | Discharge status: Against Medical Advice | Payer: Medicaid Other

## 2022-05-12 NOTE — ED Notes (Signed)
Pt states he does not want to wait and will come back in the morning when it is not as busy

## 2022-05-13 ENCOUNTER — Emergency Department (HOSPITAL_BASED_OUTPATIENT_CLINIC_OR_DEPARTMENT_OTHER)
Admission: EM | Admit: 2022-05-13 | Discharge: 2022-05-13 | Disposition: A | Discharge status: Home / Self Care | Payer: Medicaid Other | Attending: Emergency Medicine | Admitting: Emergency Medicine

## 2022-05-13 ENCOUNTER — Emergency Department (HOSPITAL_BASED_OUTPATIENT_CLINIC_OR_DEPARTMENT_OTHER): Payer: Medicaid Other

## 2022-05-13 ENCOUNTER — Encounter (HOSPITAL_BASED_OUTPATIENT_CLINIC_OR_DEPARTMENT_OTHER): Payer: Self-pay | Admitting: Emergency Medicine

## 2022-05-13 ENCOUNTER — Other Ambulatory Visit: Payer: Self-pay

## 2022-05-13 DIAGNOSIS — E1165 Type 2 diabetes mellitus with hyperglycemia: Secondary | ICD-10-CM | POA: Insufficient documentation

## 2022-05-13 DIAGNOSIS — L03116 Cellulitis of left lower limb: Secondary | ICD-10-CM | POA: Diagnosis not present

## 2022-05-13 DIAGNOSIS — I10 Essential (primary) hypertension: Secondary | ICD-10-CM | POA: Insufficient documentation

## 2022-05-13 DIAGNOSIS — M79672 Pain in left foot: Secondary | ICD-10-CM | POA: Diagnosis present

## 2022-05-13 DIAGNOSIS — M86171 Other acute osteomyelitis, right ankle and foot: Secondary | ICD-10-CM | POA: Diagnosis not present

## 2022-05-13 DIAGNOSIS — R739 Hyperglycemia, unspecified: Secondary | ICD-10-CM

## 2022-05-13 LAB — CBG MONITORING, ED: Glucose-Capillary: 227 mg/dL — ABNORMAL HIGH (ref 70–99)

## 2022-05-13 MED ORDER — DOXYCYCLINE HYCLATE 100 MG PO TABS
100.0000 mg | ORAL_TABLET | Freq: Once | ORAL | Status: AC
  Administered 2022-05-13: 100 mg via ORAL
  Filled 2022-05-13: qty 1

## 2022-05-13 MED ORDER — DOXYCYCLINE HYCLATE 100 MG PO CAPS: 100.0000 mg | ORAL_CAPSULE | Freq: Two times a day (BID) | ORAL | 0 refills | Status: DC

## 2022-05-13 NOTE — ED Provider Notes (Signed)
[place] [facility] [place] Provider Note   CSN: [id number] Arrival date & time: 05/13/22  1006     History  Chief Complaint  Patient presents with   Foot Pain    Left     Brian Day is a 42 y.o. male.  Pt is a 42 yo male with pmhx significant for htn, morbid obesity, depression, anxiety, dm2, gerd, migraines, and arthritis.  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  His left foot started hurting a few days ago. No trauma.  He said he can walk, but it hurts.       Home Medications Prior to Admission medications   Medication Sig Start Date End Date Taking? Authorizing Provider  doxycycline (VIBRAMYCIN) 100 MG capsule Take 1 capsule (100 mg total) by mouth 2 (two) times daily. 05/13/22  Yes Isla Pence, MD  atomoxetine (STRATTERA) 40 MG capsule TAKE 1 CAPSULE BY MOUTH EVERY DAY 08/19/21   Nwoko, Terese Door, PA  busPIRone (BUSPAR) 15 MG tablet Take 1 tablet (15 mg total) by mouth 3 (three) times daily. 08/19/21   Nwoko, Terese Door, PA  cyclobenzaprine (FLEXERIL) 10 MG tablet Take 10 mg by mouth 3 (three) times daily as needed for muscle spasms.    [provider]  dicyclomine (BENTYL) 20 MG tablet Take 1 tablet by mouth at bedtime as needed.    [provider]  hydrocortisone cream 1 % Apply to affected area 2 times daily 07/04/20   Janeece Fitting, PA-C  vortioxetine HBr (TRINTELLIX) 10 MG TABS tablet Take 1 tablet (10 mg total) by mouth daily. 08/22/21   Nwoko, Isidoro Donning E, PA  ZUBSOLV 5.7-1.4 MG SUBL Take 1 tablet by mouth 2 (two) times daily as needed for pain. 01/09/20   [provider]      Allergies    Colchicine, Lisinopril, Morphine, and Oxycodone hcl    Review of Systems   Review of Systems  Musculoskeletal:        Left foot pain  All other systems reviewed and are negative.   Physical Exam Updated Vital Signs BP (!) 136/94 (BP Location: Left Arm)   Pulse (!) 103   Temp 97.9 F  (36.6 C) (Oral)   Resp (!) 23   Ht 6' 3"$  (1.905 m)   Wt (!) 190.5 kg   SpO2 100%   BMI 52.50 kg/m  Physical Exam Vitals and nursing note reviewed.  Constitutional:      Appearance: Normal appearance. He is obese.  HENT:     Head: Normocephalic and atraumatic.     Right Ear: External ear normal.     Left Ear: External ear normal.     Nose: Nose normal.     Mouth/Throat:     Mouth: Mucous membranes are moist.     Pharynx: Oropharynx is clear.  Eyes:     Extraocular Movements: Extraocular movements intact.     Conjunctiva/sclera: Conjunctivae normal.     Pupils: Pupils are equal, round, and reactive to light.  Cardiovascular:     Rate and Rhythm: Normal rate and regular rhythm.     Pulses: Normal pulses.     Heart sounds: Normal heart sounds.  Pulmonary:     Effort: Pulmonary effort is normal.     Breath sounds: Normal breath sounds.  Abdominal:     General: Abdomen is flat. Bowel sounds are normal.     Palpations: Abdomen is soft.  Musculoskeletal:     Cervical  back: Normal range of motion and neck supple.     Comments: Left foot swelling.  Tenderness to dorsal part of foot.  No sores.    Skin:    General: Skin is warm.     Capillary Refill: Capillary refill takes less than 2 seconds.     Comments: Mild redness left foot  Neurological:     General: No focal deficit present.     Mental Status: He is alert and oriented to person, place, and time.  Psychiatric:        Mood and Affect: Mood normal.        Behavior: Behavior normal.     ED Results / Procedures / Treatments   Labs (all labs ordered are listed, but only abnormal results are displayed) Labs Reviewed  CBG MONITORING, ED - Abnormal; Notable for the following components:      Result Value   Glucose-Capillary 227 (*)    All other components within normal limits    EKG None  Radiology DG Foot Complete Left  Result Date: 05/13/2022 CLINICAL DATA:  Three day history of anterior foot pain. No known  injury. EXAM: LEFT FOOT - COMPLETE 3 VIEW COMPARISON:  Left foot radiographs dated 12/20/2015 FINDINGS: There is no evidence of fracture or dislocation. Dorsal calcaneal spur. Soft tissue swelling overlying the dorsum of the forefoot. IMPRESSION: Soft tissue swelling overlying the dorsum of the forefoot. No acute fracture or dislocation. Electronically Signed   By: Darrin Nipper M.D.   On: 05/13/2022 10:48    Procedures Procedures    Medications Ordered in ED Medications  doxycycline (VIBRA-TABS) tablet 100 mg (has no administration in time range)    ED Course/ Medical Decision Making/ A&P                             Medical Decision Making Amount and/or Complexity of Data Reviewed Radiology: ordered.  Risk Prescription drug management.   This patient presents to the ED for concern of left foot pain, this involves an extensive number of treatment options, and is a complaint that carries with it a high risk of complications and morbidity.  The differential diagnosis includes fx, cellulitis, osteomyelitis   Co morbidities that complicate the patient evaluation  htn, morbid obesity, depression, anxiety, dm2, gerd, migraines, and arthritis   Additional history obtained:  Additional history obtained from epic chart review    Lab Tests:  I Ordered, and personally interpreted labs.  The pertinent results include:  cbg elevated at 227   Imaging Studies ordered:  I ordered imaging studies including left foot  I independently visualized and interpreted imaging which showed  Soft tissue swelling overlying the dorsum of the forefoot. No acute  fracture or dislocation.   I agree with the radiologist interpretation   Medicines ordered and prescription drug management:  I ordered medication including doxy  for cellulitis  Reevaluation of the patient after these medicines showed that the patient improved I have reviewed the patients home medicines and have made adjustments as  needed  Problem List / ED Course:  Left foot pain:  likely due to mild cellulitis.  No signs of osteo. Pt started on doxy.  He is on chronic pain meds at home, so I am not going to add opiates.  He is to f/u with podiatry.  Elevated bs:  pt said he was told he was no longer a diabetic.  He is not taking any meds  for dm.  Pt said he sees his doc every month and his bs has been good.  It is elevated today.  Pt said he ate bad food yesterday.  He is encouraged to f/u with pcp regarding his bs.   Reevaluation:  After the interventions noted above, I reevaluated the patient and found that they have :improved   Social Determinants of Health:  Lives at home   Dispostion:  After consideration of the diagnostic results and the patients response to treatment, I feel that the patent would benefit from discharge with outpatient f/u.          Final Clinical Impression(s) / ED Diagnoses Final diagnoses:  Cellulitis of left lower extremity  Hyperglycemia    Rx / DC Orders ED Discharge Orders          Ordered    doxycycline (VIBRAMYCIN) 100 MG capsule  2 times daily        05/13/22 1107              Isla Pence, MD 05/13/22 1111

## 2022-05-13 NOTE — ED Notes (Signed)
Discharge paperwork reviewed entirely with patient, including Rx's and follow up care. Pain was under control. Pt verbalized understanding as well as all parties involved. No questions or concerns voiced at the time of discharge. No acute distress noted.   Pt ambulated out to PVA without incident or assistance.

## 2022-05-13 NOTE — ED Triage Notes (Signed)
Left foot  pain , no injury or fall , Hx right foot pain he said , now his left foot is hurting . Hx diabetes he said but no longer  diabetic since he lost weight . He stated

## 2022-06-13 ENCOUNTER — Other Ambulatory Visit: Payer: Self-pay

## 2022-06-13 ENCOUNTER — Encounter (HOSPITAL_BASED_OUTPATIENT_CLINIC_OR_DEPARTMENT_OTHER): Payer: Self-pay | Admitting: *Deleted

## 2022-06-13 ENCOUNTER — Emergency Department (HOSPITAL_BASED_OUTPATIENT_CLINIC_OR_DEPARTMENT_OTHER)
Admission: EM | Admit: 2022-06-13 | Discharge: 2022-06-13 | Disposition: A | Discharge status: Home / Self Care | Payer: Medicaid Other | Attending: Emergency Medicine | Admitting: Emergency Medicine

## 2022-06-13 ENCOUNTER — Emergency Department (HOSPITAL_BASED_OUTPATIENT_CLINIC_OR_DEPARTMENT_OTHER): Payer: Medicaid Other

## 2022-06-13 DIAGNOSIS — E119 Type 2 diabetes mellitus without complications: Secondary | ICD-10-CM | POA: Insufficient documentation

## 2022-06-13 DIAGNOSIS — R112 Nausea with vomiting, unspecified: Secondary | ICD-10-CM | POA: Diagnosis present

## 2022-06-13 DIAGNOSIS — R1033 Periumbilical pain: Secondary | ICD-10-CM | POA: Insufficient documentation

## 2022-06-13 DIAGNOSIS — F419 Anxiety disorder, unspecified: Secondary | ICD-10-CM | POA: Insufficient documentation

## 2022-06-13 LAB — CBC WITH DIFFERENTIAL/PLATELET
Abs Immature Granulocytes: 0.01 10*3/uL (ref 0.00–0.07)
Basophils Absolute: 0 10*3/uL (ref 0.0–0.1)
Basophils Relative: 0 %
Eosinophils Absolute: 0 10*3/uL (ref 0.0–0.5)
Eosinophils Relative: 0 %
HCT: 44.3 % (ref 39.0–52.0)
Hemoglobin: 15.9 g/dL (ref 13.0–17.0)
Immature Granulocytes: 0 %
Lymphocytes Relative: 22 %
Lymphs Abs: 1.5 10*3/uL (ref 0.7–4.0)
MCH: 33.5 pg (ref 26.0–34.0)
MCHC: 35.9 g/dL (ref 30.0–36.0)
MCV: 93.5 fL (ref 80.0–100.0)
Monocytes Absolute: 0.4 10*3/uL (ref 0.1–1.0)
Monocytes Relative: 6 %
Neutro Abs: 4.9 10*3/uL (ref 1.7–7.7)
Neutrophils Relative %: 72 %
Platelets: 179 10*3/uL (ref 150–400)
RBC: 4.74 MIL/uL (ref 4.22–5.81)
RDW: 11.3 % — ABNORMAL LOW (ref 11.5–15.5)
WBC: 6.8 10*3/uL (ref 4.0–10.5)
nRBC: 0 % (ref 0.0–0.2)

## 2022-06-13 LAB — COMPREHENSIVE METABOLIC PANEL
ALT: 24 U/L (ref 0–44)
AST: 22 U/L (ref 15–41)
Albumin: 3.9 g/dL (ref 3.5–5.0)
Alkaline Phosphatase: 54 U/L (ref 38–126)
Anion gap: 8 (ref 5–15)
BUN: 12 mg/dL (ref 6–20)
CO2: 25 mmol/L (ref 22–32)
Calcium: 9 mg/dL (ref 8.9–10.3)
Chloride: 101 mmol/L (ref 98–111)
Creatinine, Ser: 0.75 mg/dL (ref 0.61–1.24)
GFR, Estimated: >60 mL/min (ref >60)
Glucose, Bld: 146 mg/dL — ABNORMAL HIGH (ref 70–99)
Potassium: 3.6 mmol/L (ref 3.5–5.1)
Sodium: 134 mmol/L — ABNORMAL LOW (ref 135–145)
Total Bilirubin: 0.9 mg/dL (ref 0.3–1.2)
Total Protein: 7.3 g/dL (ref 6.5–8.1)

## 2022-06-13 LAB — TROPONIN I (HIGH SENSITIVITY): Troponin I (High Sensitivity): 3 ng/L (ref <18)

## 2022-06-13 LAB — LIPASE, BLOOD: Lipase: 22 U/L (ref 11–51)

## 2022-06-13 MED ORDER — FAMOTIDINE IN NACL 20-0.9 MG/50ML-% IV SOLN
20.0000 mg | Freq: Once | INTRAVENOUS | Status: AC
  Administered 2022-06-13: 20 mg via INTRAVENOUS
  Filled 2022-06-13: qty 50

## 2022-06-13 MED ORDER — ONDANSETRON 4 MG PO TBDP: 4.0000 mg | ORAL_TABLET | Freq: Four times a day (QID) | ORAL | 0 refills | Status: DC | PRN

## 2022-06-13 MED ORDER — SODIUM CHLORIDE 0.9 % IV BOLUS
1000.0000 mL | Freq: Once | INTRAVENOUS | Status: AC
  Administered 2022-06-13: 1000 mL via INTRAVENOUS

## 2022-06-13 MED ORDER — ONDANSETRON HCL 4 MG/2ML IJ SOLN
4.0000 mg | Freq: Once | INTRAMUSCULAR | Status: AC
  Administered 2022-06-13: 4 mg via INTRAVENOUS
  Filled 2022-06-13: qty 2

## 2022-06-13 MED ORDER — HYDROXYZINE HCL 25 MG PO TABS: 25.0000 mg | ORAL_TABLET | Freq: Four times a day (QID) | ORAL | 0 refills | Status: AC

## 2022-06-13 MED ORDER — IOHEXOL 300 MG/ML  SOLN
100.0000 mL | Freq: Once | INTRAMUSCULAR | Status: AC | PRN
  Administered 2022-06-13: 100 mL via INTRAVENOUS

## 2022-06-13 NOTE — ED Notes (Signed)
"  Feel a little better, nausea improved, pain about the same, endorses anxiety, h/o same, takes nothing for it, occasional marijuana, none recently".

## 2022-06-13 NOTE — ED Notes (Signed)
Back from CT, alert, NAD, calm, no changes. Family at [facility].

## 2022-06-13 NOTE — ED Triage Notes (Signed)
Here by POV with family for NV, hot & cold chills, body aches, and indigestion. Emesis initially food, then bile, now dry heaves. H/o gastric sleeve. Sx onset last night. No suspicious events o sick contacts. Denies fever, diarrhea, syncope. Mentions also pain and TTP at navel.

## 2022-06-13 NOTE — ED Notes (Signed)
EDPA into room, at BS.  

## 2022-06-13 NOTE — ED Notes (Signed)
Pt not in room, in CT  

## 2022-06-13 NOTE — Discharge Instructions (Addendum)
Seen today for nausea vomiting and abdominal pain.  Your lab work was reassuring.  The CT scan of your abdomen and pelvis had some incidental findings as discussed and listed on this paperwork.  There is nothing emergent on your CT scan but you need to follow-up with your primary care doctor. IMPRESSION: 1. No acute findings in the abdomen or pelvis. 2. Hepatosplenomegaly with hepatic steatosis. 3. Bilateral adrenal gland nodules measuring up to 2.5 cm on the left, which are indeterminate. Recommend follow-up adrenal protocol CT in 1 year. If stable at 1 year, no further follow-up imaging. 4. 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 5. Fat containing umbilical hernia. 6. 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 7. 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 8. Aortic atherosclerosis (ICD10-I70.0).  Regarding her anxiety you are given a prescription for hydroxyzine.  Please follow-up with behavioral health urgent care as well and or your PCP

## 2022-06-13 NOTE — ED Notes (Signed)
Pt alert, NAD, calm, interactive, ambulatory with steady gait to exam room. Family at side.

## 2022-06-13 NOTE — ED Provider Notes (Signed)
[place] [facility] Provider Note   CSN: [id number] Arrival date & time: 06/13/22  1006     History  Chief Complaint  Patient presents with   Emesis    Brian Day is a 42 y.o. male.  He has medical history of obesity, gastric sleeve surgery in 2020, he reports history of diabetes but he is not currently on medications for diabetes since he had gastric sleeve surgery.  He states around 3 AM he woke up with nausea and vomiting.  Initially was food products and then stomach acid and now just dry heaves.  Unable to tolerate any p.o. at this time.  He states that he is also having feeling of indigestion and acid reflux.  He states he gets feeling of burning in his abdomen, and then gets very nauseous and sweaty and then starts vomiting and then will feel better until the symptoms "hit again".  Chest pain or back pain, no shortness of breath.  He does complain of some discomfort in the periumbilical and epigastric area.  He denies fever or chills, no hematemesis or hematochezia.  He states he had some loose stool yesterday but has not had diarrhea yet.  Emesis      Home Medications Prior to Admission medications   Medication Sig Start Date End Date Taking? Authorizing Provider  hydrOXYzine (ATARAX) 25 MG tablet Take 1 tablet (25 mg total) by mouth every 6 (six) hours. 06/13/22  Yes Lawyer Washabaugh A, PA-C  ondansetron (ZOFRAN-ODT) 4 MG disintegrating tablet Take 1 tablet (4 mg total) by mouth every 6 (six) hours as needed for nausea or vomiting. 06/13/22  Yes Anny Sayler A, PA-C  atomoxetine (STRATTERA) 40 MG capsule TAKE 1 CAPSULE BY MOUTH EVERY DAY 08/19/21   Nwoko, Isidoro Donning E, PA  busPIRone (BUSPAR) 15 MG tablet Take 1 tablet (15 mg total) by mouth 3 (three) times daily. 08/19/21   Nwoko, Terese Door, PA  cyclobenzaprine (FLEXERIL) 10 MG tablet Take 10 mg by mouth 3 (three) times daily as needed for muscle spasms.    [provider]   dicyclomine (BENTYL) 20 MG tablet Take 1 tablet by mouth at bedtime as needed.    [provider]  doxycycline (VIBRAMYCIN) 100 MG capsule Take 1 capsule (100 mg total) by mouth 2 (two) times daily. 05/13/22   Isla Pence, MD  hydrocortisone cream 1 % Apply to affected area 2 times daily 07/04/20   Janeece Fitting, PA-C  vortioxetine HBr (TRINTELLIX) 10 MG TABS tablet Take 1 tablet (10 mg total) by mouth daily. 08/22/21   Nwoko, Isidoro Donning E, PA  ZUBSOLV 5.7-1.4 MG SUBL Take 1 tablet by mouth 2 (two) times daily as needed for pain. 01/09/20   [provider]      Allergies    Colchicine, Lisinopril, Morphine, and Oxycodone hcl    Review of Systems   Review of Systems  Gastrointestinal:  Positive for vomiting.    Physical Exam Updated Vital Signs BP (!) 134/104   Pulse 68   Temp 98.5 F (36.9 C) (Oral)   Resp 20   Ht 6\' 3"  (1.905 m)   Wt (!) 190.5 kg   SpO2 98%   BMI 52.50 kg/m  Physical Exam Vitals and nursing note reviewed.  Constitutional:      General: He is not in acute distress.    Appearance: He is well-developed.  HENT:     Head: Normocephalic and atraumatic.     Mouth/Throat:  Mouth: Mucous membranes are dry.  Eyes:     Conjunctiva/sclera: Conjunctivae normal.  Cardiovascular:     Rate and Rhythm: Normal rate and regular rhythm.     Heart sounds: No murmur heard. Pulmonary:     Effort: Pulmonary effort is normal. No respiratory distress.     Breath sounds: Normal breath sounds.  Abdominal:     Palpations: Abdomen is soft.     Tenderness: There is abdominal tenderness in the epigastric area and left lower quadrant.     Comments: Tenderness is mild and epigastric and left lower quadrant  Musculoskeletal:        General: No swelling.     Cervical back: Neck supple.  Skin:    General: Skin is warm and dry.     Capillary Refill: Capillary refill takes less than 2 seconds.  Neurological:     General: No focal deficit present.     Mental  Status: He is alert and oriented to person, place, and time.  Psychiatric:        Mood and Affect: Mood normal.     ED Results / Procedures / Treatments   Labs (all labs ordered are listed, but only abnormal results are displayed) Labs Reviewed  COMPREHENSIVE METABOLIC PANEL - Abnormal; Notable for the following components:      Result Value   Sodium 134 (*)    Glucose, Bld 146 (*)    All other components within normal limits  CBC WITH DIFFERENTIAL/PLATELET - Abnormal; Notable for the following components:   RDW 11.3 (*)    All other components within normal limits  LIPASE, BLOOD  TROPONIN I (HIGH SENSITIVITY)    EKG None  Radiology CT ABDOMEN PELVIS W CONTRAST  Result Date: 06/13/2022 CLINICAL DATA:  Left lower quadrant abdominal pain EXAM: CT ABDOMEN AND PELVIS WITH CONTRAST TECHNIQUE: Multidetector CT imaging of the abdomen and pelvis was performed using the standard protocol following bolus administration of intravenous contrast. RADIATION DOSE REDUCTION: This exam was performed according to the departmental dose-optimization program which includes automated exposure control, adjustment of the mA and/or kV according to patient size and/or use of iterative reconstruction technique. CONTRAST:  163mL OMNIPAQUE IOHEXOL 300 MG/ML  SOLN COMPARISON:  None Available. FINDINGS: Lower chest: Included lung bases are clear.  Heart size is normal. Hepatobiliary: Liver measures 23 cm in length. Mildly decreased attenuation of the hepatic parenchyma. No focal liver lesion is identified. Unremarkable gallbladder. No hyperdense gallstone. No biliary dilatation. Pancreas: Unremarkable. No pancreatic ductal dilatation or surrounding inflammatory changes. Spleen: Spleen measures 15 cm in length. No focal splenic abnormality. Adrenals/Urinary Tract: 2.0 cm right adrenal gland nodule with internal density of 25 HU. 2.5 cm left adrenal gland nodule with internal density of 29 HU. Kidneys enhance  symmetrically. No renal lesion, stone, or hydronephrosis. Urinary bladder is incompletely distended. 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 No dilated loops of bowel. Appendix is not definitively seen. No focal bowel wall thickening or inflammatory changes. Vascular/Lymphatic: Scattered aortoiliac atherosclerotic calcifications without aneurysm. No abdominopelvic lymphadenopathy. Reproductive: Prostate is unremarkable. Other: No free fluid. No abdominopelvic fluid collection. No pneumoperitoneum. Fat containing umbilical hernia. Musculoskeletal: Bilateral L5 pars interarticularis defects with grade 1 anterolisthesis of L5 on S1. Thoracolumbar spondylosis with findings of diffuse idiopathic skeletal hyperostosis. No acute bony abnormality. IMPRESSION: 1. No acute findings in the abdomen or pelvis. 2. Hepatosplenomegaly with hepatic steatosis. 3. Bilateral adrenal gland nodules measuring up to 2.5 cm on the left, which are indeterminate. Recommend  follow-up adrenal protocol CT in 1 year. If stable at 1 year, no further follow-up imaging. 4. 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 5. Fat containing umbilical hernia. 6. 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 7. 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 8. Aortic atherosclerosis (ICD10-I70.0). Electronically Signed   By: Davina Poke D.O.   On: 06/13/2022 14:08    Procedures Procedures    Medications Ordered in ED Medications  ondansetron (ZOFRAN) injection 4 mg (4 mg Intravenous Given 06/13/22 1106)  sodium chloride 0.9 % bolus 1,000 mL (0 mLs Intravenous Stopped 06/13/22 1202)  famotidine (PEPCID) IVPB 20 mg premix (0 mg Intravenous Stopped 06/13/22 1202)  iohexol (OMNIPAQUE) 300 MG/ML solution 100 mL (100 mLs Intravenous Contrast Given 06/13/22 1349)    ED Course/ Medical  Decision Making/ A&P                             Medical Decision Making This patient presents to the ED for concern of vomiting with mild abdominal pain without recent sick contacts, this involves an extensive number of treatment options, and is a complaint that carries with it a high risk of complications and morbidity.  The differential diagnosis includes stress, peptic ulcer disease, gastroenteritis, appendicitis, cholecystitis, cholelithiasis, pancreatitis, other   Co morbidities that complicate the patient evaluation  History of gastric sleeve   Additional history obtained:  Additional history obtained from EMR External records from outside source obtained and reviewed including prior ED visit for cellulitis, multiple [facility] visits for anxiety noted   Lab Tests:  I Ordered, and personally interpreted labs.  The pertinent results include: CBC CMP lipase and troponin all reassuring   Imaging Studies ordered:  I ordered imaging studies including ET abdomen pelvis I independently visualized and interpreted imaging which showed no acute findings, per radiology report there is numerous incidental findings including bilateral adrenal nodules, requiring follow-up within 1 year, hepatosplenomegaly with fatty liver, DISH and bilateral pars defect.  I discussed these findings in detail with the patient and discussed the need to follow-up with primary care doctor for further evaluation but there are no acute findings to plan his nausea vomiting diarrhea, this is been very brief and is likely viral in nature.  Strict return precautions as well I agree with the radiologist interpretation   Problem List / ED Course / Critical interventions / Medication management  Nausea and vomiting with abdominal discomfort started at 3 AM, feeling much better after fluids and nausea medication and Pepcid.  Considered foregoing CT giving reassuring labs but patient was concerned about the ongoing  discomfort in his left lower abdomen so CT was ordered, radiology read showed multiple incidental findings that were discussed in detail with patient and he is advised to follow-up with primary care for outpatient follow-up regarding these.  He is feeling better, advised on supportive care at home follow-up and return precautions Was also complaining of anxiety today and became somewhat tearful, states he is a lot going on his life.  He is not suicidal or homicidal.  [facility] has [facility] and advised to follow back up there but was given prescription for hydroxyzine to help with anxiety in the meantime.  Or shortness of breath noted Reevaluation of the patient after these medicines showed that the patient improved I have reviewed the patients home medicines and have made adjustments as needed  Amount and/or Complexity of Data Reviewed Labs: ordered. Radiology: ordered.  Risk Prescription drug management.           Final Clinical Impression(s) / ED Diagnoses Final diagnoses:  Nausea and vomiting, unspecified vomiting type  Anxiety    Rx / DC Orders ED Discharge Orders          Ordered    ondansetron (ZOFRAN-ODT) 4 MG disintegrating tablet  Every 6 hours PRN        06/13/22 1508    hydrOXYzine (ATARAX) 25 MG tablet  Every 6 hours        06/13/22 1508              Darci Current 06/13/22 1843    Margette Fast, MD 06/14/22 [phone]

## 2022-07-07 ENCOUNTER — Encounter: Payer: Self-pay | Admitting: *Deleted

## 2022-09-29 ENCOUNTER — Other Ambulatory Visit: Payer: Self-pay

## 2022-09-29 ENCOUNTER — Emergency Department (HOSPITAL_BASED_OUTPATIENT_CLINIC_OR_DEPARTMENT_OTHER)
Admission: EM | Admit: 2022-09-29 | Discharge: 2022-09-29 | Disposition: A | Discharge status: Home / Self Care | Payer: MEDICAID | Attending: Emergency Medicine | Admitting: Emergency Medicine

## 2022-09-29 DIAGNOSIS — L02211 Cutaneous abscess of abdominal wall: Secondary | ICD-10-CM | POA: Insufficient documentation

## 2022-09-29 DIAGNOSIS — L0291 Cutaneous abscess, unspecified: Secondary | ICD-10-CM

## 2022-09-29 DIAGNOSIS — Z23 Encounter for immunization: Secondary | ICD-10-CM | POA: Diagnosis not present

## 2022-09-29 MED ORDER — TETANUS-DIPHTH-ACELL PERTUSSIS 5-2.5-18.5 LF-MCG/0.5 IM SUSY
0.5000 mL | PREFILLED_SYRINGE | Freq: Once | INTRAMUSCULAR | Status: AC
  Administered 2022-09-29: 0.5 mL via INTRAMUSCULAR
  Filled 2022-09-29: qty 0.5

## 2022-09-29 MED ORDER — DOXYCYCLINE HYCLATE 100 MG PO TABS
100.0000 mg | ORAL_TABLET | Freq: Once | ORAL | Status: AC
  Administered 2022-09-29: 100 mg via ORAL
  Filled 2022-09-29: qty 1

## 2022-09-29 MED ORDER — LIDOCAINE HCL (PF) 1 % IJ SOLN
10.0000 mL | Freq: Once | INTRAMUSCULAR | Status: AC
  Administered 2022-09-29: 10 mL
  Filled 2022-09-29: qty 10

## 2022-09-29 MED ORDER — DOXYCYCLINE HYCLATE 100 MG PO CAPS: 100.0000 mg | ORAL_CAPSULE | Freq: Two times a day (BID) | ORAL | 0 refills | Status: AC

## 2022-09-29 NOTE — ED Triage Notes (Signed)
Patient presents to ED via POV from home. Here with abscess to left abdominal fold. Reports it "popped" PTA.

## 2022-09-29 NOTE — Discharge Instructions (Addendum)
Evaluation today revealed that he did have an abscess in your lower abdomen.  Recommend you continue on doxycycline which is an antibiotic that will treat infection.  Please follow-up with your PCP.  If you have worsening oozing, swelling or pain or develop fever or any other concerning symptom please return emerged part for further evaluation.  Please keep the abscess site clean daily.  This includes washing daily with soap and water.

## 2022-09-29 NOTE — ED Provider Notes (Signed)
[place] [facility] Provider Note   CSN: [id number] Arrival date & time: 09/29/22  1222     History  Chief Complaint  Patient presents with   Abscess   HPI Brian Day is a 42 y.o. male with type 2 diabetes, hypertension presenting for abscess.  Noticed it 3 to 4 days ago.  Located in the lower left quadrant of his abdomen.  States it "popped" started to use clear bloody fluid on arrival.  Denies fever.  Lesion is tender to touch.   Abscess      Home Medications Prior to Admission medications   Medication Sig Start Date End Date Taking? Authorizing Provider  doxycycline (VIBRAMYCIN) 100 MG capsule Take 1 capsule (100 mg total) by mouth 2 (two) times daily. 09/29/22  Yes Gareth Eagle, PA-C  atomoxetine (STRATTERA) 40 MG capsule TAKE 1 CAPSULE BY MOUTH EVERY DAY 08/19/21   Nwoko, Tommas Olp, PA  busPIRone (BUSPAR) 15 MG tablet Take 1 tablet (15 mg total) by mouth 3 (three) times daily. 08/19/21   Nwoko, Tommas Olp, PA  cyclobenzaprine (FLEXERIL) 10 MG tablet Take 10 mg by mouth 3 (three) times daily as needed for muscle spasms.    [provider]  dicyclomine (BENTYL) 20 MG tablet Take 1 tablet by mouth at bedtime as needed.    [provider]  hydrocortisone cream 1 % Apply to affected area 2 times daily 07/04/20   Claude Manges, PA-C  hydrOXYzine (ATARAX) 25 MG tablet Take 1 tablet (25 mg total) by mouth every 6 (six) hours. 06/13/22   Carmel Sacramento A, PA-C  ondansetron (ZOFRAN-ODT) 4 MG disintegrating tablet Take 1 tablet (4 mg total) by mouth every 6 (six) hours as needed for nausea or vomiting. 06/13/22   Carmel Sacramento A, PA-C  vortioxetine HBr (TRINTELLIX) 10 MG TABS tablet Take 1 tablet (10 mg total) by mouth daily. 08/22/21   Nwoko, Stephens Shire E, PA  ZUBSOLV 5.7-1.4 MG SUBL Take 1 tablet by mouth 2 (two) times daily as needed for pain. 01/09/20   [provider]      Allergies    Colchicine, Lisinopril,  Morphine, and Oxycodone hcl    Review of Systems   See HPI for pertinent positives  Physical Exam Updated Vital Signs BP (!) 134/90   Pulse 81   Temp 97.9 F (36.6 C) (Oral)   Resp 18   Ht 6\' 3"  (1.905 m)   Wt (!) 181.4 kg   SpO2 96%   BMI 50.00 kg/m  Physical Exam Constitutional:      Appearance: Normal appearance.  HENT:     Head: Normocephalic.     Nose: Nose normal.  Eyes:     Conjunctiva/sclera: Conjunctivae normal.  Pulmonary:     Effort: Pulmonary effort is normal.  Abdominal:    Neurological:     Mental Status: He is alert.  Psychiatric:        Mood and Affect: Mood normal.     ED Results / Procedures / Treatments   Labs (all labs ordered are listed, but only abnormal results are displayed) Labs Reviewed - No data to display  EKG None  Radiology No results found.  Procedures .Marland KitchenIncision and Drainage  Date/Time: 09/29/2022 2:40 PM  Performed by: Gareth Eagle, PA-C Authorized by: Gareth Eagle, PA-C   Consent:    Consent obtained:  Verbal   Consent given by:  Patient   Risks discussed:  Bleeding, incomplete drainage and infection  Alternatives discussed:  No treatment Universal protocol:    Procedure explained and questions answered to patient or proxy's satisfaction: yes     Relevant documents present and verified: yes     Patient identity confirmed:  Verbally with patient and arm band Location:    Type:  Abscess   Size:  1 cm   Location:  Trunk   Trunk location:  Abdomen Pre-procedure details:    Skin preparation:  Povidone-iodine Sedation:    Sedation type:  None Anesthesia:    Anesthesia method:  Local infiltration   Local anesthetic:  Lidocaine 1% w/o epi Procedure type:    Complexity:  Simple Procedure details:    Ultrasound guidance: no     Needle aspiration: no     Incision types:  Stab incision   Incision depth:  Dermal   Wound management:  Probed and deloculated   Drainage:  Bloody   Drainage amount:  Scant    Wound treatment:  Wound left open   Packing materials:  None Post-procedure details:    Procedure completion:  Tolerated well, no immediate complications     Medications Ordered in ED Medications  doxycycline (VIBRA-TABS) tablet 100 mg (has no administration in time range)  Tdap (BOOSTRIX) injection 0.5 mL (0.5 mLs Intramuscular Given 09/29/22 1426)  lidocaine (PF) (XYLOCAINE) 1 % injection 10 mL (10 mLs Infiltration Given 09/29/22 1429)    ED Course/ Medical Decision Making/ A&P                             Medical Decision Making  42 year old well-appearing male presenting for abscess.  Exam notable for likely oozing abscess in the lower left quadrant of his abdomen.  Symptoms and clinical findings are consistent with a skin abscess.  I&D procedure was well-tolerated without complication.  Started on doxycycline.  Advised conservative treatment at home.  Vital stable at discharge.  Discharged home.        Final Clinical Impression(s) / ED Diagnoses Final diagnoses:  Abscess    Rx / DC Orders ED Discharge Orders          Ordered    doxycycline (VIBRAMYCIN) 100 MG capsule  2 times daily        09/29/22 1442              Diogo, Rydalch, PA-C 09/29/22 1443    Rolan Bucco, MD 09/29/22 1504

## 2022-09-29 NOTE — ED Notes (Signed)
Dsd applied.

## 2022-09-29 NOTE — ED Notes (Signed)
Pt states has a bump under his skin fold on the  left side of his lower abd , states it just popped as he was getting triaged states has been there 4 days

## 2023-02-24 ENCOUNTER — Emergency Department (HOSPITAL_BASED_OUTPATIENT_CLINIC_OR_DEPARTMENT_OTHER)
Admission: EM | Admit: 2023-02-24 | Discharge: 2023-02-25 | Disposition: A | Discharge status: Home / Self Care | Payer: MEDICAID | Attending: Emergency Medicine | Admitting: Emergency Medicine

## 2023-02-24 ENCOUNTER — Other Ambulatory Visit: Payer: Self-pay

## 2023-02-24 ENCOUNTER — Encounter (HOSPITAL_BASED_OUTPATIENT_CLINIC_OR_DEPARTMENT_OTHER): Payer: Self-pay

## 2023-02-24 DIAGNOSIS — E119 Type 2 diabetes mellitus without complications: Secondary | ICD-10-CM | POA: Diagnosis not present

## 2023-02-24 DIAGNOSIS — M542 Cervicalgia: Secondary | ICD-10-CM | POA: Diagnosis not present

## 2023-02-24 DIAGNOSIS — S0990XA Unspecified injury of head, initial encounter: Secondary | ICD-10-CM | POA: Insufficient documentation

## 2023-02-24 DIAGNOSIS — I10 Essential (primary) hypertension: Secondary | ICD-10-CM | POA: Insufficient documentation

## 2023-02-24 DIAGNOSIS — W228XXA Striking against or struck by other objects, initial encounter: Secondary | ICD-10-CM | POA: Diagnosis not present

## 2023-02-24 DIAGNOSIS — Z79899 Other long term (current) drug therapy: Secondary | ICD-10-CM | POA: Diagnosis not present

## 2023-02-24 NOTE — ED Triage Notes (Signed)
Pt arrives with c/o head injury that happened yesterday. Pt reports feeling dazed after hitting his head on a trailer door. Pt a&ox4 in triage and ambulatory.

## 2023-02-25 ENCOUNTER — Emergency Department (HOSPITAL_BASED_OUTPATIENT_CLINIC_OR_DEPARTMENT_OTHER): Payer: MEDICAID

## 2023-02-25 MED ORDER — ONDANSETRON 4 MG PO TBDP: 4.0000 mg | ORAL_TABLET | Freq: Three times a day (TID) | ORAL | 0 refills | Status: AC | PRN

## 2023-02-25 MED ORDER — NAPROXEN 500 MG PO TABS: 500.0000 mg | ORAL_TABLET | Freq: Two times a day (BID) | ORAL | 0 refills | Status: AC | PRN

## 2023-02-25 MED ORDER — ONDANSETRON 4 MG PO TBDP
4.0000 mg | ORAL_TABLET | Freq: Once | ORAL | Status: AC
  Administered 2023-02-25: 4 mg via ORAL
  Filled 2023-02-25: qty 1

## 2023-02-25 MED ORDER — ACETAMINOPHEN 325 MG PO TABS
650.0000 mg | ORAL_TABLET | Freq: Once | ORAL | Status: AC
  Administered 2023-02-25: 650 mg via ORAL
  Filled 2023-02-25: qty 2

## 2023-02-25 NOTE — Discharge Instructions (Signed)
Your CT scan is negative.  Keep yourself hydrated.  Avoid alcohol.  Take the nausea medication as prescribed.  Follow-up with your doctor.  Return to the ED with worsening headache, confusion, weakness, numbness, tingling, difficulty speaking, difficulty swallowing or other concerns.

## 2023-02-25 NOTE — ED Notes (Signed)
Patient transported to CT 

## 2023-02-25 NOTE — ED Notes (Signed)
Pt has water at bedside has been drinking without issue.

## 2023-02-25 NOTE — ED Provider Notes (Signed)
[facility] Provider Note   CSN: [id number] Arrival date & time: 02/24/23  2332     History  Chief Complaint  Patient presents with   Head Injury    Brian Day is a 42 y.o. male.  Patient with a history of hypertension, chronic pain, diabetes presenting with head injury.  States he was walking around his truck trailer yesterday when he struck his right parietal scalp on the open trailer door.  He may have gotten knocked out for a few seconds.  Did not fall to the ground.  Complains of headache, confusion, feeling dazed, nauseous since the incident.  He is taking Tylenol at home.  Nausea but no vomiting.  No blood thinner use.  Denies any focal weakness, numbness or tingling.  No bowel or bladder incontinence.  No difficulty speaking or difficulty swallowing.  No chest pain or shortness of breath.  Significant other at bedside reports he has been more confused and slow to respond and seems paler than usual. Having some blurry vision as well.  Does not feel dizzy or lightheaded.  The history is provided by the patient and a relative.  Head Injury Associated symptoms: headache and nausea        Home Medications Prior to Admission medications   Medication Sig Start Date End Date Taking? Authorizing Provider  atomoxetine (STRATTERA) 40 MG capsule TAKE 1 CAPSULE BY MOUTH EVERY DAY 08/19/21   Nwoko, Stephens Shire E, PA  busPIRone (BUSPAR) 15 MG tablet Take 1 tablet (15 mg total) by mouth 3 (three) times daily. 08/19/21   Nwoko, Tommas Olp, PA  cyclobenzaprine (FLEXERIL) 10 MG tablet Take 10 mg by mouth 3 (three) times daily as needed for muscle spasms.    [provider]  dicyclomine (BENTYL) 20 MG tablet Take 1 tablet by mouth at bedtime as needed.    [provider]  doxycycline (VIBRAMYCIN) 100 MG capsule Take 1 capsule (100 mg total) by mouth 2 (two) times daily. 09/29/22   Gareth Eagle, PA-C  hydrocortisone cream 1 % Apply  to affected area 2 times daily 07/04/20   Claude Manges, PA-C  hydrOXYzine (ATARAX) 25 MG tablet Take 1 tablet (25 mg total) by mouth every 6 (six) hours. 06/13/22   Carmel Sacramento A, PA-C  ondansetron (ZOFRAN-ODT) 4 MG disintegrating tablet Take 1 tablet (4 mg total) by mouth every 6 (six) hours as needed for nausea or vomiting. 06/13/22   Carmel Sacramento A, PA-C  vortioxetine HBr (TRINTELLIX) 10 MG TABS tablet Take 1 tablet (10 mg total) by mouth daily. 08/22/21   Nwoko, Stephens Shire E, PA  ZUBSOLV 5.7-1.4 MG SUBL Take 1 tablet by mouth 2 (two) times daily as needed for pain. 01/09/20   [provider]      Allergies    Colchicine, Lisinopril, Morphine, and Oxycodone hcl    Review of Systems   Review of Systems  Constitutional:  Negative for activity change, appetite change, fatigue and fever.  HENT:  Negative for congestion.   Eyes:  Positive for visual disturbance.  Respiratory:  Negative for cough, chest tightness and shortness of breath.   Cardiovascular:  Negative for chest pain.  Gastrointestinal:  Positive for nausea. Negative for abdominal distention.  Genitourinary:  Negative for dysuria and hematuria.  Musculoskeletal:  Negative for arthralgias and myalgias.  Skin:  Negative for rash.  Neurological:  Positive for headaches. Negative for weakness.   all other systems are negative except as noted in the  HPI and PMH.    Physical Exam Updated Vital Signs BP (!) 129/93 (BP Location: Right Arm)   Pulse 76   Temp 98.2 F (36.8 C)   Resp 20   Wt (!) 181.4 kg   SpO2 98%   BMI 50.00 kg/m  Physical Exam Vitals and nursing note reviewed.  Constitutional:      General: He is not in acute distress.    Appearance: He is well-developed.  HENT:     Head: Normocephalic and atraumatic.     Ears:     Comments: No septal hematoma or hemotympanum    Mouth/Throat:     Pharynx: No oropharyngeal exudate.  Eyes:     Conjunctiva/sclera: Conjunctivae normal.     Pupils: Pupils are  equal, round, and reactive to light.  Neck:     Comments: Diffuse paraspinal C-spine tenderness Cardiovascular:     Rate and Rhythm: Normal rate and regular rhythm.     Heart sounds: Normal heart sounds. No murmur heard. Pulmonary:     Effort: Pulmonary effort is normal. No respiratory distress.     Breath sounds: Normal breath sounds.  Abdominal:     Palpations: Abdomen is soft.     Tenderness: There is no abdominal tenderness. There is no guarding or rebound.  Musculoskeletal:        General: No tenderness. Normal range of motion.     Cervical back: Normal range of motion and neck supple.  Skin:    General: Skin is warm.  Neurological:     Mental Status: He is alert and oriented to person, place, and time.     Cranial Nerves: No cranial nerve deficit.     Motor: No abnormal muscle tone.     Coordination: Coordination normal.     Comments: CN 2-12 intact, no ataxia on finger to nose, no nystagmus, 5/5 strength throughout, no pronator drift, Romberg negative, normal gait.   Psychiatric:        Behavior: Behavior normal.     ED Results / Procedures / Treatments   Labs (all labs ordered are listed, but only abnormal results are displayed) Labs Reviewed - No data to display  EKG None  Radiology CT Head Wo Contrast  Result Date: 02/25/2023 CLINICAL DATA:  Head trauma EXAM: CT HEAD WITHOUT CONTRAST CT CERVICAL SPINE WITHOUT CONTRAST TECHNIQUE: Multidetector CT imaging of the head and cervical spine was performed following the standard protocol without intravenous contrast. Multiplanar CT image reconstructions of the cervical spine were also generated. RADIATION DOSE REDUCTION: This exam was performed according to the departmental dose-optimization program which includes automated exposure control, adjustment of the mA and/or kV according to patient size and/or use of iterative reconstruction technique. COMPARISON:  None Available. FINDINGS: CT HEAD FINDINGS Brain: There is no mass,  hemorrhage or extra-axial collection. The size and configuration of the ventricles and extra-axial CSF spaces are normal. The brain parenchyma is normal, without evidence of acute or chronic infarction. Vascular: No abnormal hyperdensity of the major intracranial arteries or dural venous sinuses. No intracranial atherosclerosis. Skull: The visualized skull base, calvarium and extracranial soft tissues are normal. Sinuses/Orbits: No fluid levels or advanced mucosal thickening of the visualized paranasal sinuses. No mastoid or middle ear effusion. The orbits are normal. CT CERVICAL SPINE FINDINGS Alignment: No static subluxation. Facets are aligned. Occipital condyles are normally positioned. Skull base and vertebrae: No acute fracture. Soft tissues and spinal canal: No prevertebral fluid or swelling. No visible canal hematoma. Disc levels: No advanced  spinal canal or neural foraminal stenosis. Upper chest: No pneumothorax, pulmonary nodule or pleural effusion. Other: Normal visualized paraspinal cervical soft tissues. IMPRESSION: 1. No acute intracranial abnormality. 2. No acute fracture or static subluxation of the cervical spine. Electronically Signed   By: Deatra Robinson M.D.   On: 02/25/2023 01:16   CT Cervical Spine Wo Contrast  Result Date: 02/25/2023 CLINICAL DATA:  Head trauma EXAM: CT HEAD WITHOUT CONTRAST CT CERVICAL SPINE WITHOUT CONTRAST TECHNIQUE: Multidetector CT imaging of the head and cervical spine was performed following the standard protocol without intravenous contrast. Multiplanar CT image reconstructions of the cervical spine were also generated. RADIATION DOSE REDUCTION: This exam was performed according to the departmental dose-optimization program which includes automated exposure control, adjustment of the mA and/or kV according to patient size and/or use of iterative reconstruction technique. COMPARISON:  None Available. FINDINGS: CT HEAD FINDINGS Brain: There is no mass, hemorrhage or  extra-axial collection. The size and configuration of the ventricles and extra-axial CSF spaces are normal. The brain parenchyma is normal, without evidence of acute or chronic infarction. Vascular: No abnormal hyperdensity of the major intracranial arteries or dural venous sinuses. No intracranial atherosclerosis. Skull: The visualized skull base, calvarium and extracranial soft tissues are normal. Sinuses/Orbits: No fluid levels or advanced mucosal thickening of the visualized paranasal sinuses. No mastoid or middle ear effusion. The orbits are normal. CT CERVICAL SPINE FINDINGS Alignment: No static subluxation. Facets are aligned. Occipital condyles are normally positioned. Skull base and vertebrae: No acute fracture. Soft tissues and spinal canal: No prevertebral fluid or swelling. No visible canal hematoma. Disc levels: No advanced spinal canal or neural foraminal stenosis. Upper chest: No pneumothorax, pulmonary nodule or pleural effusion. Other: Normal visualized paraspinal cervical soft tissues. IMPRESSION: 1. No acute intracranial abnormality. 2. No acute fracture or static subluxation of the cervical spine. Electronically Signed   By: Deatra Robinson M.D.   On: 02/25/2023 01:16    Procedures Procedures    Medications Ordered in ED Medications  ondansetron (ZOFRAN-ODT) disintegrating tablet 4 mg (has no administration in time range)  acetaminophen (TYLENOL) tablet 650 mg (has no administration in time range)    ED Course/ Medical Decision Making/ A&P                                 Medical Decision Making Amount and/or Complexity of Data Reviewed Labs: ordered. Decision-making details documented in ED Course. Radiology: ordered and independent interpretation performed. Decision-making details documented in ED Course. ECG/medicine tests: ordered and independent interpretation performed. Decision-making details documented in ED Course.  Risk OTC drugs. Prescription drug  management.  Head injury yesterday now with confusion, nausea, feeling dazed, blurry vision, lightheadedness.  Neurological exam is nonfocal.  No wounds on scalp.  Concern for concussion Patient given pain and nausea medication.  CT head and C-spine are negative for acute traumatic injury.  Results reviewed and interpreted by me.  Patient feels improved after Tylenol and Zofran.  Discussed likely diagnosis of concussion.  Discussed that headaches, dizziness, nausea can persist for several weeks after concussion.  Had true precautions given.  Discussed brain rest, anti-inflammatories, antiemetics, PCP follow-up.  Return to the ED with worsening headache, weakness, numbness, tingling, difficulty speaking, difficulty swallowing or other concerns.        Final Clinical Impression(s) / ED Diagnoses Final diagnoses:  None    Rx / DC Orders ED Discharge Orders  None         Glynn Octave, MD 02/25/23 [phone]

## 2023-05-19 ENCOUNTER — Emergency Department (HOSPITAL_BASED_OUTPATIENT_CLINIC_OR_DEPARTMENT_OTHER)
Admission: EM | Admit: 2023-05-19 | Discharge: 2023-05-19 | Disposition: A | Discharge status: Home / Self Care | Payer: MEDICAID | Attending: Emergency Medicine | Admitting: Emergency Medicine

## 2023-05-19 ENCOUNTER — Other Ambulatory Visit: Payer: Self-pay

## 2023-05-19 ENCOUNTER — Encounter (HOSPITAL_BASED_OUTPATIENT_CLINIC_OR_DEPARTMENT_OTHER): Payer: Self-pay

## 2023-05-19 DIAGNOSIS — S60211A Contusion of right wrist, initial encounter: Secondary | ICD-10-CM | POA: Diagnosis not present

## 2023-05-19 DIAGNOSIS — X58XXXA Exposure to other specified factors, initial encounter: Secondary | ICD-10-CM | POA: Diagnosis not present

## 2023-05-19 DIAGNOSIS — S6991XA Unspecified injury of right wrist, hand and finger(s), initial encounter: Secondary | ICD-10-CM | POA: Diagnosis present

## 2023-05-19 NOTE — ED Notes (Signed)
 Discharge instructions reviewed with patient. Patient verbalizes understanding, no further questions at this time. Medications and follow up information provided. No acute distress noted at time of departure.

## 2023-05-19 NOTE — ED Triage Notes (Addendum)
 Was at the dentist to get teeth pulled, had an IV in right wrist. States he questioned dentist on getting his teeth pulled and left prior to getting them pulled, IV was removed and he left prior to getting gauze placed. Dried blood noted on hand. Slight swelling noted.

## 2023-05-19 NOTE — ED Provider Notes (Signed)
 [facility] Provider Note   CSN: [id number] Arrival date & time: 05/19/23  4098     History  Chief Complaint  Patient presents with   Wrist Pain     Brian Day is a 43 y.o. male.  Presenting to the ED for evaluation of a puncture wound to the right wrist.  He states he presented to his dentist office this morning to have some teeth pulled but questioned which teeth they were pulling.  He states he was told to leave.  Prior to leaving, he states that they pulled the IV out of his right wrist forcefully.  He noticed some swelling shortly afterwards.  No bleeding from the site.  States it feels sore.  No numbness, weakness or tingling to the arm.  He denies any bleeding disorders.   Wrist Pain       Home Medications Prior to Admission medications   Medication Sig Start Date End Date Taking? Authorizing Provider  atomoxetine (STRATTERA) 40 MG capsule TAKE 1 CAPSULE BY MOUTH EVERY DAY 08/19/21   Nwoko, Stephens Shire E, PA  busPIRone (BUSPAR) 15 MG tablet Take 1 tablet (15 mg total) by mouth 3 (three) times daily. 08/19/21   Nwoko, Tommas Olp, PA  cyclobenzaprine (FLEXERIL) 10 MG tablet Take 10 mg by mouth 3 (three) times daily as needed for muscle spasms.    [provider]  dicyclomine (BENTYL) 20 MG tablet Take 1 tablet by mouth at bedtime as needed.    [provider]  doxycycline (VIBRAMYCIN) 100 MG capsule Take 1 capsule (100 mg total) by mouth 2 (two) times daily. 09/29/22   Gareth Eagle, PA-C  hydrocortisone cream 1 % Apply to affected area 2 times daily 07/04/20   Claude Manges, PA-C  hydrOXYzine (ATARAX) 25 MG tablet Take 1 tablet (25 mg total) by mouth every 6 (six) hours. 06/13/22   Carmel Sacramento A, PA-C  naproxen (NAPROSYN) 500 MG tablet Take 1 tablet (500 mg total) by mouth 2 (two) times daily as needed. 02/25/23   Rancour, Jeannett Senior, MD  ondansetron (ZOFRAN-ODT) 4 MG disintegrating tablet Take 1 tablet (4 mg total)  by mouth every 8 (eight) hours as needed for nausea or vomiting. 02/25/23   Rancour, Jeannett Senior, MD  vortioxetine HBr (TRINTELLIX) 10 MG TABS tablet Take 1 tablet (10 mg total) by mouth daily. 08/22/21   Nwoko, Stephens Shire E, PA  ZUBSOLV 5.7-1.4 MG SUBL Take 1 tablet by mouth 2 (two) times daily as needed for pain. 01/09/20   [provider]      Allergies    Colchicine, Lisinopril, Morphine, and Oxycodone hcl    Review of Systems   Review of Systems  Skin:  Positive for wound.  All other systems reviewed and are negative.   Physical Exam Updated Vital Signs BP (!) 164/121 (BP Location: Right Arm)   Pulse 89   Temp 98.3 F (36.8 C) (Oral)   Resp 18   Ht 6\' 3"  (1.905 m)   Wt (!) 181.9 kg   SpO2 100%   BMI 50.12 kg/m  Physical Exam Vitals and nursing note reviewed.  Constitutional:      General: He is not in acute distress.    Appearance: Normal appearance. He is normal weight. He is not ill-appearing.  HENT:     Head: Normocephalic and atraumatic.  Pulmonary:     Effort: Pulmonary effort is normal. No respiratory distress.  Abdominal:     General: Abdomen is flat.  Musculoskeletal:        General: Normal range of motion.     Cervical back: Neck supple.     Comments: No TTP to right wrist.  Full AROM.  Grip strength 5 out of 5.  Sensation intact distally.  Capillary refill normal.  Radial pulse 2+.  Skin:    General: Skin is warm and dry.     Comments: Tiny puncture wound to dorsum of right wrist, mild surrounding swelling.  No erythema.  No bleeding or purulent drainage.  No warmth.  Neurological:     Mental Status: He is alert and oriented to person, place, and time.  Psychiatric:        Mood and Affect: Mood normal.        Behavior: Behavior normal.     ED Results / Procedures / Treatments   Labs (all labs ordered are listed, but only abnormal results are displayed) Labs Reviewed - No data to display  EKG None  Radiology No results  found.  Procedures Procedures    Medications Ordered in ED Medications - No data to display  ED Course/ Medical Decision Making/ A&P                                 Medical Decision Making This patient presents to the ED for concern of puncture wound to right wrist, this involves an extensive number of treatment options, and is a complaint that carries with it a high risk of complications and morbidity.  The differential diagnosis includes hematoma, laceration  Additional history obtained from: Nursing notes from this visit.  43 year old male presenting to the ED for evaluation of right wrist pain after having an IV taken out just prior to arrival.  Pain is described as a mild tenderness.  Main concern was some associated swelling that has improved since his arrival.  No neurologic complaints.  On exam, there is mild amount of swelling to the dorsum of the right wrist with an associated tiny puncture wound consistent with an IV placement.  Neurovascular status intact.  Patient was encouraged to use compression and ice.  He was given return precautions.  Stable at discharge.  At this time there does not appear to be any evidence of an acute emergency medical condition and the patient appears stable for discharge with appropriate outpatient follow up. Diagnosis was discussed with patient who verbalizes understanding of care plan and is agreeable to discharge. I have discussed return precautions with patient who verbalizes understanding. Patient encouraged to follow-up with their PCP as needed. All questions answered.  Note: Portions of this report may have been transcribed using voice recognition software. Every effort was made to ensure accuracy; however, inadvertent computerized transcription errors may still be present.        Final Clinical Impression(s) / ED Diagnoses Final diagnoses:  Hematoma of right wrist    Rx / DC Orders ED Discharge Orders     None          Michelle Piper, Cordelia Poche 05/19/23 1031    Jacalyn Lefevre, MD 05/19/23 [phone]

## 2023-05-19 NOTE — Discharge Instructions (Signed)
 You have been seen today for your complaint of right wrist pain. Home care instructions are as follows:  Apply ice and compression to the wrist until the swelling improves Follow up with: Your primary care provider as needed Please seek immediate medical care if you develop any of the following symptoms: Your hematoma is in your chest or abdomen and you have weakness, shortness of breath, or a change in consciousness. You have a hematoma on your scalp that is caused by a fall or injury, and you also have: A headache that gets worse. Trouble speaking or understanding speech. Weakness. A change in alertness or consciousness. At this time there does not appear to be the presence of an emergent medical condition, however there is always the potential for conditions to change. Please read and follow the below instructions.  Do not take your medicine if  develop an itchy rash, swelling in your mouth or lips, or difficulty breathing; call 911 and seek immediate emergency medical attention if this occurs.  You may review your lab tests and imaging results in their entirety on your MyChart account.  Please discuss all results of fully with your primary care provider and other specialist at your follow-up visit.  Note: Portions of this text may have been transcribed using voice recognition software. Every effort was made to ensure accuracy; however, inadvertent computerized transcription errors may still be present.

## 2023-06-03 ENCOUNTER — Telehealth: Payer: Self-pay | Admitting: Physician Assistant

## 2023-06-03 NOTE — Telephone Encounter (Signed)
 Copied from CRM [id number]. Topic: General - Other >> Jun 03, 2023  9:21 AM Almira Coaster wrote: Reason for CRM: Patient was referred to our office to see Dr.Thomas Jones, I advised the patient that Dr.Jones was not accepting new patient. Patient states he was referred by Charlett Blake a personal friend of Dr.Jones. Please advise patient if Dr.Jones is willing to take him on as a new patient.  ---  Please confirm

## 2023-06-03 NOTE — Telephone Encounter (Signed)
 Will you see him as a new patient ?

## 2023-06-03 NOTE — Telephone Encounter (Signed)
 Patient has been made aware. Gave a verbal understanding.

## 2024-02-02 ENCOUNTER — Other Ambulatory Visit: Payer: Self-pay

## 2024-02-02 ENCOUNTER — Encounter (HOSPITAL_BASED_OUTPATIENT_CLINIC_OR_DEPARTMENT_OTHER): Payer: Self-pay

## 2024-02-02 ENCOUNTER — Emergency Department (HOSPITAL_BASED_OUTPATIENT_CLINIC_OR_DEPARTMENT_OTHER)
Admission: EM | Admit: 2024-02-02 | Discharge: 2024-02-03 | Disposition: A | Payer: MEDICAID | Attending: Emergency Medicine | Admitting: Emergency Medicine

## 2024-02-02 DIAGNOSIS — R1011 Right upper quadrant pain: Secondary | ICD-10-CM | POA: Insufficient documentation

## 2024-02-02 DIAGNOSIS — R079 Chest pain, unspecified: Secondary | ICD-10-CM | POA: Diagnosis not present

## 2024-02-02 DIAGNOSIS — R109 Unspecified abdominal pain: Secondary | ICD-10-CM

## 2024-02-02 LAB — CBC WITH DIFFERENTIAL/PLATELET
Abs Immature Granulocytes: 0.04 K/uL (ref 0.00–0.07)
Basophils Absolute: 0 K/uL (ref 0.0–0.1)
Basophils Relative: 0 %
Eosinophils Absolute: 0.1 K/uL (ref 0.0–0.5)
Eosinophils Relative: 1 %
HCT: 41.5 % (ref 39.0–52.0)
Hemoglobin: 14.9 g/dL (ref 13.0–17.0)
Immature Granulocytes: 0 %
Lymphocytes Relative: 26 %
Lymphs Abs: 2.7 K/uL (ref 0.7–4.0)
MCH: 33.9 pg (ref 26.0–34.0)
MCHC: 35.9 g/dL (ref 30.0–36.0)
MCV: 94.5 fL (ref 80.0–100.0)
Monocytes Absolute: 0.8 K/uL (ref 0.1–1.0)
Monocytes Relative: 8 %
Neutro Abs: 6.9 K/uL (ref 1.7–7.7)
Neutrophils Relative %: 65 %
Platelets: 182 K/uL (ref 150–400)
RBC: 4.39 MIL/uL (ref 4.22–5.81)
RDW: 11.5 % (ref 11.5–15.5)
WBC: 10.5 K/uL (ref 4.0–10.5)
nRBC: 0 % (ref 0.0–0.2)

## 2024-02-02 NOTE — ED Triage Notes (Signed)
 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  Possible hernia Other concern is he started taken Seven 7oh (7-OH or 7-hydroxymitragynine) refers to a potent, concentrated substance derived from the kratom plant that is an opioid agonist. States she has been taken 600-700 mg per day

## 2024-02-03 ENCOUNTER — Emergency Department (HOSPITAL_BASED_OUTPATIENT_CLINIC_OR_DEPARTMENT_OTHER): Payer: MEDICAID

## 2024-02-03 LAB — COMPREHENSIVE METABOLIC PANEL WITH GFR
ALT: 16 U/L (ref 0–44)
AST: 19 U/L (ref 15–41)
Albumin: 4.5 g/dL (ref 3.5–5.0)
Alkaline Phosphatase: 72 U/L (ref 38–126)
Anion gap: 13 (ref 5–15)
BUN: 14 mg/dL (ref 6–20)
CO2: 26 mmol/L (ref 22–32)
Calcium: 9.5 mg/dL (ref 8.9–10.3)
Chloride: 99 mmol/L (ref 98–111)
Creatinine, Ser: 0.81 mg/dL (ref 0.61–1.24)
GFR, Estimated: >60 mL/min (ref >60)
Glucose, Bld: 160 mg/dL — ABNORMAL HIGH (ref 70–99)
Potassium: 3.8 mmol/L (ref 3.5–5.1)
Sodium: 138 mmol/L (ref 135–145)
Total Bilirubin: 0.5 mg/dL (ref 0.0–1.2)
Total Protein: 7.7 g/dL (ref 6.5–8.1)

## 2024-02-03 LAB — LIPASE, BLOOD: Lipase: 18 U/L (ref 11–51)

## 2024-02-03 LAB — TROPONIN T, HIGH SENSITIVITY: Troponin T High Sensitivity: <15 ng/L (ref 0–19)

## 2024-02-03 MED ORDER — IOHEXOL 300 MG/ML  SOLN
125.0000 mL | Freq: Once | INTRAMUSCULAR | Status: AC | PRN
  Administered 2024-02-03: 125 mL via INTRAVENOUS

## 2024-02-03 NOTE — ED Notes (Signed)
 Patient transported to CT

## 2024-02-03 NOTE — ED Provider Notes (Signed)
 [facility] Provider Note   CSN: [id number] Arrival date & time: 02/02/24  2252     Patient presents with: Abdominal Pain   Brian Day is a 43 y.o. male.   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 Started using kratom derivative (7-OH) over last few months and has gotten to the point where he feels he is addicted. Planning to go to detox/rehab/treatment tomorrow but tonight had ruq abdominal pain with nausea. Has had constipation recently related to the 7-OH but still passing gas, no distension. No alcohol use. Also uses marijuana but no other drugs.    Abdominal Pain      Prior to Admission medications   Medication Sig Start Date End Date Taking? Authorizing Provider  atomoxetine  (STRATTERA ) 40 MG capsule TAKE 1 CAPSULE BY MOUTH EVERY DAY 08/19/21   Nwoko, Uchenna E, PA  busPIRone  (BUSPAR ) 15 MG tablet Take 1 tablet (15 mg total) by mouth 3 (three) times daily. 08/19/21   Nwoko, Uchenna E, PA  cyclobenzaprine (FLEXERIL) 10 MG tablet Take 10 mg by mouth 3 (three) times daily as needed for muscle spasms.    [provider]  dicyclomine (BENTYL) 20 MG tablet Take 1 tablet by mouth at bedtime as needed.    [provider]  doxycycline  (VIBRAMYCIN ) 100 MG capsule Take 1 capsule (100 mg total) by mouth 2 (two) times daily. 09/29/22   Robinson, Trever K, PA-C  hydrocortisone  cream 1 % Apply to affected area 2 times daily 07/04/20   Soto, Johana, PA-C  hydrOXYzine  (ATARAX ) 25 MG tablet Take 1 tablet (25 mg total) by mouth every 6 (six) hours. 06/13/22   Suellen Cantor A, PA-C  naproxen  (NAPROSYN ) 500 MG tablet Take 1 tablet (500 mg total) by mouth 2 (two) times daily as needed. 02/25/23   Rancour, Garnette, MD  ondansetron  (ZOFRAN -ODT) 4 MG disintegrating tablet Take 1 tablet (4 mg total) by mouth every 8 (eight) hours as needed for nausea or  vomiting. 02/25/23   Rancour, Garnette, MD  vortioxetine  HBr (TRINTELLIX ) 10 MG TABS tablet Take 1 tablet (10 mg total) by mouth daily. 08/22/21   Nwoko, Uchenna E, PA  ZUBSOLV  5.7-1.4 MG SUBL Take 1 tablet by mouth 2 (two) times daily as needed for pain. 01/09/20   [provider]    Allergies: Colchicine , Lisinopril , Morphine , and Oxycodone  hcl    Review of Systems  Gastrointestinal:  Positive for abdominal pain.    Updated Vital Signs BP 113/74 (BP Location: Left Arm)   Pulse 70   Temp 97.7 F (36.5 C) (Oral)   Resp 11   Ht 6' 3 (1.905 m)   Wt (!) 180.5 kg   SpO2 94%   BMI 49.75 kg/m   Physical Exam Vitals and nursing note reviewed.  Constitutional:      Appearance: He is well-developed.  HENT:     Head: Normocephalic and atraumatic.  Cardiovascular:     Rate and Rhythm: Normal rate.  Pulmonary:     Effort: Pulmonary effort is normal. No respiratory distress.  Abdominal:     General: There is no distension.     Tenderness: There is no abdominal tenderness.  Musculoskeletal:        General: Normal range of motion.     Cervical back: Normal range of motion.  Skin:    General: Skin is warm and dry.  Neurological:  Mental Status: He is alert.     (all labs ordered are listed, but only abnormal results are displayed) Labs Reviewed  COMPREHENSIVE METABOLIC PANEL WITH GFR - Abnormal; Notable for the following components:      Result Value   Glucose, Bld 160 (*)    All other components within normal limits  CBC WITH DIFFERENTIAL/PLATELET  LIPASE, BLOOD  TROPONIN T, HIGH SENSITIVITY    EKG: None  Radiology: DG Chest Portable 1 View Result Date: 02/03/2024 EXAM: 1 VIEW(S) XRAY OF THE CHEST 02/03/2024 01:42:00 AM COMPARISON: 12/06/2016 CLINICAL HISTORY: eval for chest pain FINDINGS: LUNGS AND PLEURA: No focal pulmonary opacity. No pleural effusion. No pneumothorax. HEART AND MEDIASTINUM: No acute abnormality of the cardiac and mediastinal silhouettes.  BONES AND SOFT TISSUES: No acute osseous abnormality. IMPRESSION: 1. No acute cardiopulmonary process identified. Electronically signed by: Oneil Devonshire MD 02/03/2024 01:46 AM EST RP Workstation: [id number]   CT ABDOMEN PELVIS W CONTRAST Result Date: 02/03/2024 EXAM: CT ABDOMEN AND PELVIS WITH CONTRAST 02/03/2024 01:24:53 AM TECHNIQUE: CT of the abdomen and pelvis was performed with the administration of 125 mL of iohexol  (OMNIPAQUE ) 300 MG/ML solution. Multiplanar reformatted images are provided for review. Automated exposure control, iterative reconstruction, and/or weight-based adjustment of the mA/kV was utilized to reduce the radiation dose to as low as reasonably achievable. COMPARISON: 06/13/2022 CLINICAL HISTORY: Paraumbilical pain. FINDINGS: LOWER CHEST: Lung bases are free of acute infiltrate or sizable effusion. LIVER: Liver is within normal limits. GALLBLADDER AND BILE DUCTS: Gallbladder is within normal limits. No biliary ductal dilatation. SPLEEN: The spleen is within normal limits. PANCREAS: The pancreas is unremarkable. ADRENAL GLANDS: Bilateral adrenal adenomas are seen, stable from the prior exam. No further follow-up is recommended. KIDNEYS, URETERS AND BLADDER: Kidneys are well visualized with normal enhancement pattern. No renal calculi or obstructive changes are seen. Bladder is within normal limits. GI AND BOWEL: Postsurgical changes in the stomach are noted. Scattered mild diverticular change of the colon is noted. No diverticulitis is seen. The appendix is within normal limits. Small bowel is within normal limits. There is no bowel obstruction. PERITONEUM AND RETROPERITONEUM: No free fluid is seen. No free air. VASCULATURE: Atherosclerotic calcifications of the aorta are noted. LYMPH NODES: No lymphadenopathy. REPRODUCTIVE ORGANS: The prostate is within normal limits. BONES AND SOFT TISSUES: Fat-containing umbilical hernia is noted. No acute bony abnormality is noted. IMPRESSION: 1. No  acute findings in the abdomen or pelvis. 2. Fat-containing umbilical hernia. 3. Stable bilateral adrenal adenomas, no further follow-up recommended. Electronically signed by: Oneil Devonshire MD 02/03/2024 01:34 AM EST RP Workstation: [id number]     Procedures   Medications Ordered in the ED  iohexol  (OMNIPAQUE ) 300 MG/ML solution 125 mL (125 mLs Intravenous Contrast Given 02/03/24 0109)                                    Medical Decision Making Amount and/or Complexity of Data Reviewed Labs: ordered. Radiology: ordered. ECG/medicine tests: ordered.  Risk Prescription drug management.   PUD vs hepatobiliary vs drug side effects.   Labs reassuring. Secondary to h/o gastric sleeve and multiple symptoms, CT done to eval for complications, biliary issues and was negative. Stable for d/c w/ GI follow up if no improvement and to continue drug treatment aspirations.      Final diagnoses:  Abdominal pain, unspecified abdominal location  Chest pain, unspecified type    ED Discharge Orders  None          Shadee Rathod, Selinda, MD 02/03/24 [phone]
